# Patient Record
Sex: Female | Born: 1940
Health system: Southern US, Community
[De-identification: ages and names within clinical notes are randomized; demographics above are authoritative.]

## PROBLEM LIST (undated history)

## (undated) DIAGNOSIS — I440 Atrioventricular block, first degree: Secondary | ICD-10-CM

## (undated) DIAGNOSIS — R202 Paresthesia of skin: Secondary | ICD-10-CM

## (undated) DIAGNOSIS — N390 Urinary tract infection, site not specified: Secondary | ICD-10-CM

## (undated) DIAGNOSIS — M1990 Unspecified osteoarthritis, unspecified site: Secondary | ICD-10-CM

## (undated) DIAGNOSIS — Z8719 Personal history of other diseases of the digestive system: Secondary | ICD-10-CM

## (undated) DIAGNOSIS — N189 Chronic kidney disease, unspecified: Secondary | ICD-10-CM

## (undated) DIAGNOSIS — I447 Left bundle-branch block, unspecified: Secondary | ICD-10-CM

## (undated) DIAGNOSIS — Z87448 Personal history of other diseases of urinary system: Secondary | ICD-10-CM

## (undated) DIAGNOSIS — G709 Myoneural disorder, unspecified: Secondary | ICD-10-CM

## (undated) DIAGNOSIS — E039 Hypothyroidism, unspecified: Secondary | ICD-10-CM

## (undated) DIAGNOSIS — D179 Benign lipomatous neoplasm, unspecified: Secondary | ICD-10-CM

## (undated) DIAGNOSIS — I739 Peripheral vascular disease, unspecified: Secondary | ICD-10-CM

## (undated) DIAGNOSIS — D649 Anemia, unspecified: Secondary | ICD-10-CM

## (undated) DIAGNOSIS — F419 Anxiety disorder, unspecified: Secondary | ICD-10-CM

## (undated) DIAGNOSIS — J189 Pneumonia, unspecified organism: Secondary | ICD-10-CM

## (undated) DIAGNOSIS — G8929 Other chronic pain: Secondary | ICD-10-CM

## (undated) DIAGNOSIS — E7800 Pure hypercholesterolemia, unspecified: Secondary | ICD-10-CM

## (undated) DIAGNOSIS — R200 Anesthesia of skin: Secondary | ICD-10-CM

## (undated) DIAGNOSIS — M48061 Spinal stenosis, lumbar region without neurogenic claudication: Secondary | ICD-10-CM

## (undated) DIAGNOSIS — L853 Xerosis cutis: Secondary | ICD-10-CM

## (undated) DIAGNOSIS — M4802 Spinal stenosis, cervical region: Secondary | ICD-10-CM

## (undated) DIAGNOSIS — K219 Gastro-esophageal reflux disease without esophagitis: Secondary | ICD-10-CM

## (undated) DIAGNOSIS — I10 Essential (primary) hypertension: Secondary | ICD-10-CM

## (undated) DIAGNOSIS — E079 Disorder of thyroid, unspecified: Secondary | ICD-10-CM

## (undated) DIAGNOSIS — M549 Dorsalgia, unspecified: Secondary | ICD-10-CM

## (undated) DIAGNOSIS — L309 Dermatitis, unspecified: Secondary | ICD-10-CM

## (undated) HISTORY — DX: Spinal stenosis, cervical region: M48.02

## (undated) HISTORY — DX: Disorder of thyroid, unspecified: E07.9

## (undated) HISTORY — PX: APPENDECTOMY: SHX54

## (undated) HISTORY — DX: Other chronic pain: G89.29

## (undated) HISTORY — PX: COLONOSCOPY W/ POLYPECTOMY: SHX1380

## (undated) HISTORY — PX: JOINT REPLACEMENT: SHX530

## (undated) HISTORY — PX: TONSILLECTOMY: SUR1361

## (undated) HISTORY — DX: Anxiety disorder, unspecified: F41.9

## (undated) HISTORY — DX: Pure hypercholesterolemia, unspecified: E78.00

## (undated) HISTORY — DX: Dorsalgia, unspecified: M54.9

## (undated) HISTORY — DX: Essential (primary) hypertension: I10

## (undated) HISTORY — DX: Spinal stenosis, lumbar region without neurogenic claudication: M48.061

---

## 1969-01-15 HISTORY — PX: THYROIDECTOMY: SHX17

## 1982-01-15 HISTORY — PX: APPENDECTOMY: SHX54

## 1984-01-16 HISTORY — PX: TOTAL ABDOMINAL HYSTERECTOMY: SHX209

## 1997-04-29 ENCOUNTER — Other Ambulatory Visit: Admission: RE | Admit: 1997-04-29 | Discharge: 1997-04-29 | Payer: Self-pay | Admitting: Family Medicine

## 1998-07-05 ENCOUNTER — Ambulatory Visit (HOSPITAL_COMMUNITY): Admission: RE | Admit: 1998-07-05 | Discharge: 1998-07-05 | Payer: Self-pay | Admitting: Gastroenterology

## 2000-06-10 ENCOUNTER — Other Ambulatory Visit: Admission: RE | Admit: 2000-06-10 | Discharge: 2000-06-10 | Payer: Self-pay | Admitting: Radiology

## 2000-06-19 ENCOUNTER — Encounter: Payer: Self-pay | Admitting: General Surgery

## 2000-06-20 ENCOUNTER — Encounter (INDEPENDENT_AMBULATORY_CARE_PROVIDER_SITE_OTHER): Payer: Self-pay | Admitting: Specialist

## 2000-06-20 ENCOUNTER — Ambulatory Visit (HOSPITAL_COMMUNITY): Admission: RE | Admit: 2000-06-20 | Discharge: 2000-06-20 | Payer: Self-pay | Admitting: General Surgery

## 2002-08-07 ENCOUNTER — Ambulatory Visit (HOSPITAL_COMMUNITY): Admission: RE | Admit: 2002-08-07 | Discharge: 2002-08-07 | Payer: Self-pay | Admitting: Gastroenterology

## 2002-08-07 ENCOUNTER — Encounter (INDEPENDENT_AMBULATORY_CARE_PROVIDER_SITE_OTHER): Payer: Self-pay | Admitting: Specialist

## 2003-11-29 ENCOUNTER — Other Ambulatory Visit: Admission: RE | Admit: 2003-11-29 | Discharge: 2003-11-29 | Payer: Self-pay | Admitting: Family Medicine

## 2004-01-16 HISTORY — PX: BREAST BIOPSY: SHX20

## 2008-07-07 ENCOUNTER — Encounter: Admission: RE | Admit: 2008-07-07 | Discharge: 2008-08-04 | Payer: Self-pay | Admitting: Family Medicine

## 2008-10-20 ENCOUNTER — Encounter: Admission: RE | Admit: 2008-10-20 | Discharge: 2008-10-20 | Payer: Self-pay | Admitting: Family Medicine

## 2008-11-29 ENCOUNTER — Encounter: Admission: RE | Admit: 2008-11-29 | Discharge: 2008-11-29 | Payer: Self-pay | Admitting: Family Medicine

## 2009-01-06 ENCOUNTER — Encounter: Admission: RE | Admit: 2009-01-06 | Discharge: 2009-01-06 | Payer: Self-pay | Admitting: Family Medicine

## 2009-01-20 ENCOUNTER — Encounter: Admission: RE | Admit: 2009-01-20 | Discharge: 2009-01-20 | Payer: Self-pay | Admitting: Neurology

## 2009-01-28 ENCOUNTER — Inpatient Hospital Stay (HOSPITAL_COMMUNITY): Admission: RE | Admit: 2009-01-28 | Discharge: 2009-01-30 | Payer: Self-pay | Admitting: Neurosurgery

## 2009-01-28 HISTORY — PX: CERVICAL SPINE SURGERY: SHX589

## 2009-06-30 ENCOUNTER — Encounter: Admission: RE | Admit: 2009-06-30 | Discharge: 2009-06-30 | Payer: Self-pay | Admitting: Family Medicine

## 2010-04-02 LAB — BASIC METABOLIC PANEL
BUN: 10 mg/dL (ref 6–23)
CO2: 27 mEq/L (ref 19–32)
Calcium: 9.5 mg/dL (ref 8.4–10.5)
Chloride: 101 mEq/L (ref 96–112)
Creatinine, Ser: 0.7 mg/dL (ref 0.4–1.2)
GFR calc Af Amer: >60 mL/min (ref >60)
GFR calc non Af Amer: >60 mL/min (ref >60)
Glucose, Bld: 116 mg/dL — ABNORMAL HIGH (ref 70–99)
Potassium: 3.1 mEq/L — ABNORMAL LOW (ref 3.5–5.1)
Sodium: 138 mEq/L (ref 135–145)

## 2010-04-02 LAB — CBC
HCT: 42.6 % (ref 36.0–46.0)
Hemoglobin: 14.4 g/dL (ref 12.0–15.0)
MCHC: 33.9 g/dL (ref 30.0–36.0)
MCV: 84.5 fL (ref 78.0–100.0)
Platelets: 343 10*3/uL (ref 150–400)
RBC: 5.04 MIL/uL (ref 3.87–5.11)
RDW: 13.6 % (ref 11.5–15.5)
WBC: 11.3 10*3/uL — ABNORMAL HIGH (ref 4.0–10.5)

## 2010-06-02 NOTE — Op Note (Signed)
   Ashley Mercer, Ashley Mercer                           ACCOUNT NO.:  [id number]   MEDICAL RECORD NO.:  [id number]                   PATIENT TYPE:  AMB   LOCATION:  ENDO                                 FACILITY:  [facility]   PHYSICIAN:  Bernette Redbird, M.D.                DATE OF BIRTH:  Sep 26, 1940   DATE OF PROCEDURE:  08/07/2002  DATE OF DISCHARGE:                                 OPERATIVE REPORT   PROCEDURE:  Colonoscopy with biopsies.   INDICATIONS FOR PROCEDURE:  Follow up of colonic adenomas removed  colonoscopically about 4 years ago, at which time colonoscopy was being done  because of a polyp seen on the screening flexible sigmoidoscopy.   FINDINGS:  Several small sessile polyps, removed.   DESCRIPTION OF PROCEDURE:  The nature, purpose and risks of the procedure to  the patient from prior examinations. She provided written consent. Sedation  was Fentanyl 70 micrograms and Versed 7 mg IV without arrhythmias or  desaturation.   The Olympus adjustable tension pediatric video colonoscope was advanced to  the cecum with clear visualization of the appendiceal orifice. Pullback was  then performed. The quality of the prep was very good and it was felt that  all areas were well seen.   In the left colon, probably near the splenic flexure, there was a roughly 5-  mm sessile polyp  removed by several cold biopsies and adjacent to it was a  tiny hyperplastic-appearing polyp which was also biopsied. There was another  small sessile hyperplastic-appearing polyp at about  25 cm removed by single  cold biopsy.   No large polyps, cancer, colitis, vascular malformations or diverticular  disease were noted. Retroflexion in the rectum as well as reinspection of  the rectum was unremarkable.   The patient tolerated the procedure well. There were no apparent  complications.   IMPRESSION:  Small colon polyps removed as described above.    PLAN:  Await pathology with anticipated colonoscopic  follow up in 3 to 5  years depending on the histologic findings.                                                 Bernette Redbird, M.D.    RB/MEDQ  D:  08/07/2002  T:  08/07/2002  Job:  [id number]   cc:   Molly Maduro A. Nicholos Johns, M.D.  [address]  [place]  [place] [address]  Fax: [phone]

## 2010-06-02 NOTE — Op Note (Signed)
[facility]  Patient:    Ashley Mercer, Ashley Mercer                        MRN: [id number] Proc. Date: 06/20/00 Adm. Date:  [id number] Attending:  Carson Myrtle CC:         Elana Alm Eliezer Lofts., M.D.  Jeralyn Ruths, M.D.   Operative Report  PREOPERATIVE DIAGNOSIS:  Mass left breast.  POSTOPERATIVE DIAGNOSIS:  Mass left breast.  PROCEDURE:  Partial mastectomy left.  SURGEON:  Timothy E. Earlene Plater, M.D.  ANESTHESIA:  Local standby.  INDICATIONS:  Ms. Stare is otherwise healthy, history of hypertension, thyroid replacement, no prior breast history.  Developed a bloody discharge left nipple, was evaluated and worked up by Dr. Yolanda Bonine, showing evidence for a papilloma.  The patient presented for consultation.  Surgery was scheduled at her wish.  Laboratory data were normal.  EKG shows new changes of left atrial enlargement and left bundle branch block which will be evaluated.  DESCRIPTION OF PROCEDURE:  The patient was brought to the operating room and placed supine.  IV started sedation given.  Left breast prepped and draped in the usual fashion.  Gentle compression in the 1 oclock position left breast indeed produced a small amount of bloody discharge.  A lacrimal duct probe was placed through the orifice of the left nipple and indeed proceeded to the left upper out quadrant.  Marcaine 0.25% with epinephrine was used for local anesthesia around the areolar edge.  A generous incision was made, the subcutaneous tissue dissected, and gentle motion of the duct probe indicated its position in the 2 oclock position.  I gently by blunt and sharp dissection, was able to identify a mass in this area, dissected down upon it, and was dissecting round and about this mass and the tear duct probe; however, the fibrocystic changes and evidence for ductal discharge progressed from that 2 oclock position and curved up into the 12 oclock position within the fibrous  portion of the breast tissue.  All of this tissue was completely removed.  Once out, this was marked, a single strand of silk indicating the 12 oclock position, a double strand of silk indicating the 2 oclock position. Bleeding was controlled with cautery.  The wound was dry, and the tissue was approximated with 2-0 Vicryl.  The skin was closed with 4-0 Monocryl. Steri-Strips applied.  Sponge, sharp, and instrument counts correct.  She tolerated it well and was removed to the recovery room.  Family conference was held and instruction given including Vicodin for pain. DD:  06/20/00 TD:  06/20/00 Job: [id number]

## 2011-04-02 DIAGNOSIS — Z23 Encounter for immunization: Secondary | ICD-10-CM | POA: Diagnosis not present

## 2011-04-02 DIAGNOSIS — K219 Gastro-esophageal reflux disease without esophagitis: Secondary | ICD-10-CM | POA: Diagnosis not present

## 2011-04-02 DIAGNOSIS — M545 Low back pain, unspecified: Secondary | ICD-10-CM | POA: Diagnosis not present

## 2011-04-02 DIAGNOSIS — Z0000 Encounter for general adult medical examination without abnormal findings: Secondary | ICD-10-CM | POA: Diagnosis not present

## 2011-04-02 DIAGNOSIS — E039 Hypothyroidism, unspecified: Secondary | ICD-10-CM | POA: Diagnosis not present

## 2011-04-02 DIAGNOSIS — E785 Hyperlipidemia, unspecified: Secondary | ICD-10-CM | POA: Diagnosis not present

## 2011-04-02 DIAGNOSIS — I10 Essential (primary) hypertension: Secondary | ICD-10-CM | POA: Diagnosis not present

## 2011-04-02 DIAGNOSIS — N951 Menopausal and female climacteric states: Secondary | ICD-10-CM | POA: Diagnosis not present

## 2011-06-26 DIAGNOSIS — R5381 Other malaise: Secondary | ICD-10-CM | POA: Diagnosis not present

## 2011-06-26 DIAGNOSIS — R5383 Other fatigue: Secondary | ICD-10-CM | POA: Diagnosis not present

## 2011-06-26 DIAGNOSIS — M549 Dorsalgia, unspecified: Secondary | ICD-10-CM | POA: Diagnosis not present

## 2011-07-02 ENCOUNTER — Other Ambulatory Visit: Payer: Self-pay | Admitting: Family Medicine

## 2011-07-02 DIAGNOSIS — Z1231 Encounter for screening mammogram for malignant neoplasm of breast: Secondary | ICD-10-CM

## 2011-07-09 ENCOUNTER — Ambulatory Visit
Admission: RE | Admit: 2011-07-09 | Discharge: 2011-07-09 | Disposition: A | Discharge status: Home / Self Care | Payer: Medicare Other | Source: Ambulatory Visit | Attending: Family Medicine | Admitting: Family Medicine

## 2011-07-09 ENCOUNTER — Other Ambulatory Visit: Payer: Self-pay | Admitting: Neurology

## 2011-07-09 ENCOUNTER — Other Ambulatory Visit: Payer: Self-pay | Admitting: Family Medicine

## 2011-07-09 DIAGNOSIS — G8929 Other chronic pain: Secondary | ICD-10-CM

## 2011-07-09 DIAGNOSIS — R531 Weakness: Secondary | ICD-10-CM

## 2011-07-09 DIAGNOSIS — Z1231 Encounter for screening mammogram for malignant neoplasm of breast: Secondary | ICD-10-CM | POA: Diagnosis not present

## 2011-07-11 ENCOUNTER — Other Ambulatory Visit: Payer: Self-pay | Admitting: Neurology

## 2011-07-11 ENCOUNTER — Ambulatory Visit
Admission: RE | Admit: 2011-07-11 | Discharge: 2011-07-11 | Disposition: A | Discharge status: Home / Self Care | Payer: Medicare Other | Source: Ambulatory Visit | Attending: Neurology | Admitting: Neurology

## 2011-07-11 DIAGNOSIS — R531 Weakness: Secondary | ICD-10-CM

## 2011-07-11 DIAGNOSIS — G8929 Other chronic pain: Secondary | ICD-10-CM

## 2011-07-11 DIAGNOSIS — M549 Dorsalgia, unspecified: Secondary | ICD-10-CM

## 2011-07-11 DIAGNOSIS — M47817 Spondylosis without myelopathy or radiculopathy, lumbosacral region: Secondary | ICD-10-CM | POA: Diagnosis not present

## 2011-07-11 DIAGNOSIS — M5137 Other intervertebral disc degeneration, lumbosacral region: Secondary | ICD-10-CM | POA: Diagnosis not present

## 2011-07-11 DIAGNOSIS — M5126 Other intervertebral disc displacement, lumbar region: Secondary | ICD-10-CM | POA: Diagnosis not present

## 2011-07-12 DIAGNOSIS — M5030 Other cervical disc degeneration, unspecified cervical region: Secondary | ICD-10-CM | POA: Diagnosis not present

## 2011-07-12 DIAGNOSIS — M542 Cervicalgia: Secondary | ICD-10-CM | POA: Diagnosis not present

## 2011-07-18 ENCOUNTER — Other Ambulatory Visit: Payer: Self-pay | Admitting: Gastroenterology

## 2011-07-18 DIAGNOSIS — K5730 Diverticulosis of large intestine without perforation or abscess without bleeding: Secondary | ICD-10-CM | POA: Diagnosis not present

## 2011-07-18 DIAGNOSIS — Z09 Encounter for follow-up examination after completed treatment for conditions other than malignant neoplasm: Secondary | ICD-10-CM | POA: Diagnosis not present

## 2011-07-18 DIAGNOSIS — Z86010 Personal history of colonic polyps: Secondary | ICD-10-CM | POA: Diagnosis not present

## 2011-07-18 DIAGNOSIS — D126 Benign neoplasm of colon, unspecified: Secondary | ICD-10-CM | POA: Diagnosis not present

## 2011-08-21 ENCOUNTER — Ambulatory Visit: Payer: Medicare Other | Attending: Neurology | Admitting: Physical Therapy

## 2011-08-21 DIAGNOSIS — M545 Low back pain, unspecified: Secondary | ICD-10-CM | POA: Insufficient documentation

## 2011-08-21 DIAGNOSIS — IMO0001 Reserved for inherently not codable concepts without codable children: Secondary | ICD-10-CM | POA: Insufficient documentation

## 2011-08-21 DIAGNOSIS — M2569 Stiffness of other specified joint, not elsewhere classified: Secondary | ICD-10-CM | POA: Insufficient documentation

## 2011-08-30 ENCOUNTER — Encounter: Payer: Medicare Other | Admitting: Physical Therapy

## 2011-09-05 ENCOUNTER — Ambulatory Visit: Payer: Medicare Other | Admitting: Physical Therapy

## 2011-09-05 DIAGNOSIS — M545 Low back pain, unspecified: Secondary | ICD-10-CM | POA: Diagnosis not present

## 2011-09-05 DIAGNOSIS — IMO0001 Reserved for inherently not codable concepts without codable children: Secondary | ICD-10-CM | POA: Diagnosis not present

## 2011-09-05 DIAGNOSIS — M2569 Stiffness of other specified joint, not elsewhere classified: Secondary | ICD-10-CM | POA: Diagnosis not present

## 2011-09-11 ENCOUNTER — Ambulatory Visit: Payer: Medicare Other | Admitting: Physical Therapy

## 2011-09-11 DIAGNOSIS — IMO0001 Reserved for inherently not codable concepts without codable children: Secondary | ICD-10-CM | POA: Diagnosis not present

## 2011-09-11 DIAGNOSIS — M545 Low back pain, unspecified: Secondary | ICD-10-CM | POA: Diagnosis not present

## 2011-09-11 DIAGNOSIS — M2569 Stiffness of other specified joint, not elsewhere classified: Secondary | ICD-10-CM | POA: Diagnosis not present

## 2011-09-13 ENCOUNTER — Ambulatory Visit: Payer: Medicare Other | Admitting: Physical Therapy

## 2011-09-18 ENCOUNTER — Ambulatory Visit: Payer: Medicare Other | Attending: Neurology | Admitting: Physical Therapy

## 2011-09-18 DIAGNOSIS — M545 Low back pain, unspecified: Secondary | ICD-10-CM | POA: Insufficient documentation

## 2011-09-18 DIAGNOSIS — M2569 Stiffness of other specified joint, not elsewhere classified: Secondary | ICD-10-CM | POA: Insufficient documentation

## 2011-09-18 DIAGNOSIS — IMO0001 Reserved for inherently not codable concepts without codable children: Secondary | ICD-10-CM | POA: Insufficient documentation

## 2011-09-20 ENCOUNTER — Ambulatory Visit: Payer: Medicare Other | Admitting: Physical Therapy

## 2011-09-25 ENCOUNTER — Ambulatory Visit: Payer: Medicare Other | Admitting: Physical Therapy

## 2011-09-26 DIAGNOSIS — M48061 Spinal stenosis, lumbar region without neurogenic claudication: Secondary | ICD-10-CM | POA: Diagnosis not present

## 2011-09-27 ENCOUNTER — Ambulatory Visit: Payer: Medicare Other | Admitting: Physical Therapy

## 2011-10-02 ENCOUNTER — Ambulatory Visit: Payer: Medicare Other | Admitting: Physical Therapy

## 2011-10-04 ENCOUNTER — Encounter: Payer: Medicare Other | Admitting: Physical Therapy

## 2011-10-22 DIAGNOSIS — K219 Gastro-esophageal reflux disease without esophagitis: Secondary | ICD-10-CM | POA: Diagnosis not present

## 2011-10-22 DIAGNOSIS — E039 Hypothyroidism, unspecified: Secondary | ICD-10-CM | POA: Diagnosis not present

## 2011-10-22 DIAGNOSIS — N951 Menopausal and female climacteric states: Secondary | ICD-10-CM | POA: Diagnosis not present

## 2011-10-22 DIAGNOSIS — E785 Hyperlipidemia, unspecified: Secondary | ICD-10-CM | POA: Diagnosis not present

## 2011-10-22 DIAGNOSIS — M545 Low back pain, unspecified: Secondary | ICD-10-CM | POA: Diagnosis not present

## 2011-10-22 DIAGNOSIS — I10 Essential (primary) hypertension: Secondary | ICD-10-CM | POA: Diagnosis not present

## 2011-10-22 DIAGNOSIS — Z23 Encounter for immunization: Secondary | ICD-10-CM | POA: Diagnosis not present

## 2012-03-05 DIAGNOSIS — R269 Unspecified abnormalities of gait and mobility: Secondary | ICD-10-CM | POA: Diagnosis not present

## 2012-03-05 DIAGNOSIS — R209 Unspecified disturbances of skin sensation: Secondary | ICD-10-CM | POA: Diagnosis not present

## 2012-03-05 DIAGNOSIS — M48061 Spinal stenosis, lumbar region without neurogenic claudication: Secondary | ICD-10-CM | POA: Diagnosis not present

## 2012-04-24 DIAGNOSIS — M25569 Pain in unspecified knee: Secondary | ICD-10-CM | POA: Diagnosis not present

## 2012-04-24 DIAGNOSIS — E039 Hypothyroidism, unspecified: Secondary | ICD-10-CM | POA: Diagnosis not present

## 2012-04-24 DIAGNOSIS — N951 Menopausal and female climacteric states: Secondary | ICD-10-CM | POA: Diagnosis not present

## 2012-04-24 DIAGNOSIS — K219 Gastro-esophageal reflux disease without esophagitis: Secondary | ICD-10-CM | POA: Diagnosis not present

## 2012-04-24 DIAGNOSIS — Z1331 Encounter for screening for depression: Secondary | ICD-10-CM | POA: Diagnosis not present

## 2012-04-24 DIAGNOSIS — E785 Hyperlipidemia, unspecified: Secondary | ICD-10-CM | POA: Diagnosis not present

## 2012-04-24 DIAGNOSIS — I10 Essential (primary) hypertension: Secondary | ICD-10-CM | POA: Diagnosis not present

## 2012-04-24 DIAGNOSIS — M545 Low back pain, unspecified: Secondary | ICD-10-CM | POA: Diagnosis not present

## 2012-05-21 ENCOUNTER — Other Ambulatory Visit: Payer: Self-pay | Admitting: Family Medicine

## 2012-05-21 DIAGNOSIS — Z1231 Encounter for screening mammogram for malignant neoplasm of breast: Secondary | ICD-10-CM

## 2012-06-10 DIAGNOSIS — M1710 Unilateral primary osteoarthritis, unspecified knee: Secondary | ICD-10-CM | POA: Diagnosis not present

## 2012-07-11 ENCOUNTER — Ambulatory Visit: Payer: Medicare Other

## 2012-07-11 ENCOUNTER — Ambulatory Visit
Admission: RE | Admit: 2012-07-11 | Discharge: 2012-07-11 | Disposition: A | Discharge status: Home / Self Care | Payer: Medicare Other | Source: Ambulatory Visit | Attending: Family Medicine | Admitting: Family Medicine

## 2012-07-11 DIAGNOSIS — Z1231 Encounter for screening mammogram for malignant neoplasm of breast: Secondary | ICD-10-CM

## 2012-08-18 ENCOUNTER — Telehealth: Payer: Self-pay | Admitting: Neurology

## 2012-08-18 NOTE — Telephone Encounter (Signed)
Pt has not been seen by Dr. Frances Furbish prior Dr. Sandria Manly pt needs to be reassigned per Dr. Frances Furbish.

## 2012-08-20 NOTE — Telephone Encounter (Signed)
Assigned to Dr. Penumalli. 

## 2012-09-02 ENCOUNTER — Ambulatory Visit: Payer: Self-pay | Admitting: Neurology

## 2012-10-08 DIAGNOSIS — I10 Essential (primary) hypertension: Secondary | ICD-10-CM | POA: Diagnosis not present

## 2012-10-08 DIAGNOSIS — E039 Hypothyroidism, unspecified: Secondary | ICD-10-CM | POA: Diagnosis not present

## 2012-10-08 DIAGNOSIS — M545 Low back pain, unspecified: Secondary | ICD-10-CM | POA: Diagnosis not present

## 2012-10-08 DIAGNOSIS — Z23 Encounter for immunization: Secondary | ICD-10-CM | POA: Diagnosis not present

## 2012-10-08 DIAGNOSIS — K219 Gastro-esophageal reflux disease without esophagitis: Secondary | ICD-10-CM | POA: Diagnosis not present

## 2012-10-08 DIAGNOSIS — E785 Hyperlipidemia, unspecified: Secondary | ICD-10-CM | POA: Diagnosis not present

## 2012-10-08 DIAGNOSIS — Z0000 Encounter for general adult medical examination without abnormal findings: Secondary | ICD-10-CM | POA: Diagnosis not present

## 2012-10-08 DIAGNOSIS — Z1331 Encounter for screening for depression: Secondary | ICD-10-CM | POA: Diagnosis not present

## 2012-10-08 DIAGNOSIS — N951 Menopausal and female climacteric states: Secondary | ICD-10-CM | POA: Diagnosis not present

## 2012-10-14 ENCOUNTER — Ambulatory Visit (INDEPENDENT_AMBULATORY_CARE_PROVIDER_SITE_OTHER): Payer: Medicare Other | Admitting: Diagnostic Neuroimaging

## 2012-10-14 ENCOUNTER — Encounter: Payer: Self-pay | Admitting: Diagnostic Neuroimaging

## 2012-10-14 VITALS — BP 122/64 | HR 68 | Temp 97.9°F | Ht 62.0 in | Wt 198.5 lb

## 2012-10-14 DIAGNOSIS — M4712 Other spondylosis with myelopathy, cervical region: Secondary | ICD-10-CM | POA: Diagnosis not present

## 2012-10-14 DIAGNOSIS — R269 Unspecified abnormalities of gait and mobility: Secondary | ICD-10-CM | POA: Diagnosis not present

## 2012-10-14 DIAGNOSIS — M48061 Spinal stenosis, lumbar region without neurogenic claudication: Secondary | ICD-10-CM | POA: Diagnosis not present

## 2012-10-14 NOTE — Patient Instructions (Signed)
Follow up with PCP and Dr. Jeral Fruit.

## 2012-10-14 NOTE — Progress Notes (Signed)
[facility]  PATIENT: Ashley Mercer DOB: September 22, 1940  REFERRING CLINICIAN:  HISTORY FROM: patient REASON FOR VISIT: follow up   HISTORICAL  CHIEF COMPLAINT:  Chief Complaint  Patient presents with  . Follow-up    Prior Dr. Sandria Manly patient, Myelopathy, cervical spine, gait disturbance, weakness    HISTORY OF PRESENT ILLNESS:   UPDATE 10/14/12: Since last visit, pain and symptoms are stable. Tried reducing cymbalta off, but realized that it was helping her pain control. Tried water aerobics last summer which helped.  UPDATE 03/05/12 (CM): Returns for followup. She is actually feeling better in regards to back pain, she remains on Cymbalta 60mg  daily. There is no radiation down the legs. Husband recently in hospital for MG. She continues to walk for exercise. Has not tried water therapy.   PRIOR HPI (09/28/11, Dr. Sandria Manly): 72 year old right-handed, African American married female who developed a "crick in her neck" on the right side in June 2010. She also developed eczema. She noticed difficulty getting up to go to the bathroom  and loss of balance. In October 2010 her stumbling was worse. She discontinued Zocor and was placed on WelChol but had no benefit. She noticed weakness in her arms. She had difficulty with numbness and a cold sensation in the left hand involving all 5 fingers and in her right hand involving all 5 fingers beginning November 2010.She noted a sensation of heat on the lateral  aspect of both legs. She denied a history of Lhermitte's sign, bowel or bladder dysfunction, or focal weakness. She had no cranial nerve symptoms. She had pain in the right scapular region. She had no falls or injuries. MRI study of the brain from 11/29/2008 showed an HNP at C4-5 that in the sagittal view as well as severe spinal stenosis but no brain abnormalities. Cervical MRI  confirmed severe cervical spinal stenosis with cord compression. She was referred to Dr Jeral Fruit who performed  anterior cervical diskectomy, decompression and fusion on January 14th, 2011.Her symptoms continue to improve. She has some numbness and tingling in the distal portions of her fingers, but all other paresthesias have resolved. She notes that the right hand has improved more than the left. She says that her 4th and 5th fingers feel almost normal now. She has more finger dexterity now and can button and do other fine motor activities.  She has occasional leg cramps that are not severe. She notes fatigue with walking long distances. 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 She has nonradiating back pain since  summer 2012 and started Cymbalta, which she says dramatically decreased her pain for 6 months, but now seems to have no effect. She also takes Tramadol at intervals. She is dieting. Her pain has gotten worse. Her pain is not radiating into her legs. She has an increase with bending over coughing and sneezing. There is no bowel or bladder incontinence.The pain is always present.She thinks it may have started as far back as 2011. It effects herwalking. She denies numbness in her feet and legs. She is undergoing physical therapy with definite benefit. Her pain has gone from a 7-8/10 to 4-5/10. rather this discomfort down her left leg. Her symptoms are worse standing or walking. She denies other symptoms.  REVIEW OF SYSTEMS: Full 14 system review of systems performed and notable only for an joint pain and cramps aching muscles and sensitivity numbness in the left hand and right fingertips.  ALLERGIES: No Known Allergies  HOME MEDICATIONS:  Prior to Admission medications   Medication Sig Start Date End Date Taking? Authorizing Provider  amLODipine (NORVASC) 10 MG tablet Take 10 mg by mouth daily.   Yes Historical Provider, MD  aspirin EC 81 MG tablet Take 81 mg by mouth daily.   Yes Historical Provider, MD  colesevelam (WELCHOL) 625 MG tablet Take 1,250 mg by mouth 2 (two) times daily with a  meal.   Yes Historical Provider, MD  DULoxetine (CYMBALTA) 60 MG capsule Take 60 mg by mouth daily.   Yes Historical Provider, MD  gemfibrozil (LOPID) 600 MG tablet Take 600 mg by mouth daily.   Yes Historical Provider, MD  ibuprofen (ADVIL,MOTRIN) 800 MG tablet Take 800 mg by mouth 2 (two) times daily.   Yes Historical Provider, MD  levothyroxine (SYNTHROID, LEVOTHROID) 100 MCG tablet Take 100 mcg by mouth daily before breakfast.   Yes Historical Provider, MD  Multiple Vitamins-Minerals (MULTIVITAMIN PO) Take by mouth daily.   Yes Historical Provider, MD  quinapril-hydrochlorothiazide (ACCURETIC) 20-25 MG per tablet Take 1 tablet by mouth daily.   Yes Historical Provider, MD  raloxifene (EVISTA) 60 MG tablet Take 60 mg by mouth daily.   Yes Historical Provider, MD  traMADol (ULTRAM) 50 MG tablet Take 50 mg by mouth every 6 (six) hours as needed for pain.   Yes Historical Provider, MD   No outpatient prescriptions prior to visit.   No facility-administered medications prior to visit.    PAST MEDICAL HISTORY: Past Medical History  Diagnosis Date  . Hypertension   . Thyroid condition   . Chronic back pain   . Cervical spinal stenosis     PAST SURGICAL HISTORY: Past Surgical History  Procedure Laterality Date  . Thyroidectomy  1971  . Total abdominal hysterectomy  1986  . Breast biopsy  2006    benign  . Cervical spine surgery  01/28/2009    FAMILY HISTORY: Family History  Problem Relation Age of Onset  . Heart disease Mother   . Kidney failure Father     SOCIAL HISTORY:  History   Social History  . Marital Status: Married    Spouse Name: Fayrene Fearing    Number of Children: 5  . Years of Education: College   Occupational History  . Retired     Charity fundraiser   Social History Main Topics  . Smoking status: Never Smoker   . Smokeless tobacco: Never Used  . Alcohol Use: Yes     Comment: quit in 1997; wine occasionally  . Drug Use: No  . Sexual Activity: Not on file   Other  Topics Concern  . Not on file   Social History Narrative   Patient lives at home with her spouse.   Caffeine Use: 2 cups daily     PHYSICAL EXAM  Filed Vitals:   10/14/12 0836  BP: 122/64  Pulse: 68  Temp: 97.9 F (36.6 C)  TempSrc: Oral  Height: 5\' 2"  (1.575 m)  Weight: 198 lb 8 oz (90.039 kg)    Not recorded    Body mass index is 36.3 kg/(m^2).  GENERAL EXAM: Patient is in no distress  CARDIOVASCULAR: Regular rate and rhythm, no murmurs, no carotid bruits  NEUROLOGIC: MENTAL STATUS: awake, alert, language fluent, comprehension intact, naming intact CRANIAL NERVE: no papilledema on fundoscopic exam, pupils equal and reactive to light, visual fields full to confrontation, extraocular muscles intact, no nystagmus, facial sensation and strength symmetric, uvula midline, shoulder shrug symmetric, tongue midline. MOTOR: normal bulk and tone, full  strength in the BUE, BLE SENSORY: normal and symmetric to light touch COORDINATION: finger-nose-finger, fine finger movements normal REFLEXES: deep tendon reflexes present and symmetric GAIT/STATION: SLOW, STIFF GAIT, SLIGHTLY UNSTEADY. CANNOT TANDEM.    DIAGNOSTIC DATA (LABS, IMAGING, TESTING) - I reviewed patient records, labs, notes, testing and imaging myself where available.  Lab Results  Component Value Date   WBC 11.3* 01/28/2009   HGB 14.4 01/28/2009   HCT 42.6 01/28/2009   MCV 84.5 01/28/2009   PLT 343 01/28/2009      Component Value Date/Time   NA 138 01/28/2009 0755   K 3.1* 01/28/2009 0755   CL 101 01/28/2009 0755   CO2 27 01/28/2009 0755   GLUCOSE 116* 01/28/2009 0755   BUN 10 01/28/2009 0755   CREATININE 0.70 01/28/2009 0755   CALCIUM 9.5 01/28/2009 0755   GFRNONAA >60 01/28/2009 0755   GFRAA  Value: >60        The eGFR has been calculated using the MDRD equation. This calculation has not been validated in all clinical situations. eGFR's persistently <60 mL/min signify possible Chronic Kidney Disease. 01/28/2009  0755   No results found for this basename: CHOL, HDL, LDLCALC, LDLDIRECT, TRIG, CHOLHDL   No results found for this basename: HGBA1C   No results found for this basename: VITAMINB12   No results found for this basename: TSH     ASSESSMENT AND PLAN  72 y.o. year old female here with lumbar spinal stenosis, cervical myelopathy s/p cervical discectomy at C4-C5. Symptoms stable. No active neurologic issues. Recommend she follow up with PCP and neurosurgery or pain mgmt going forward to manage chronic pain.  Return for return to PCP.    Suanne Marker, MD 10/14/2012, 9:04 AM Certified in Neurology, Neurophysiology and Neuroimaging  [facility] [address], [address] [place], [place] [address] [phone]

## 2012-11-04 DIAGNOSIS — E8941 Symptomatic postprocedural ovarian failure: Secondary | ICD-10-CM | POA: Diagnosis not present

## 2013-01-16 DIAGNOSIS — L259 Unspecified contact dermatitis, unspecified cause: Secondary | ICD-10-CM | POA: Diagnosis not present

## 2013-01-16 DIAGNOSIS — N3000 Acute cystitis without hematuria: Secondary | ICD-10-CM | POA: Diagnosis not present

## 2013-01-16 DIAGNOSIS — I998 Other disorder of circulatory system: Secondary | ICD-10-CM | POA: Diagnosis not present

## 2013-04-07 DIAGNOSIS — M545 Low back pain, unspecified: Secondary | ICD-10-CM | POA: Diagnosis not present

## 2013-04-07 DIAGNOSIS — K219 Gastro-esophageal reflux disease without esophagitis: Secondary | ICD-10-CM | POA: Diagnosis not present

## 2013-04-07 DIAGNOSIS — E785 Hyperlipidemia, unspecified: Secondary | ICD-10-CM | POA: Diagnosis not present

## 2013-04-07 DIAGNOSIS — E039 Hypothyroidism, unspecified: Secondary | ICD-10-CM | POA: Diagnosis not present

## 2013-04-07 DIAGNOSIS — I10 Essential (primary) hypertension: Secondary | ICD-10-CM | POA: Diagnosis not present

## 2013-10-08 DIAGNOSIS — Z1331 Encounter for screening for depression: Secondary | ICD-10-CM | POA: Diagnosis not present

## 2013-10-08 DIAGNOSIS — I10 Essential (primary) hypertension: Secondary | ICD-10-CM | POA: Diagnosis not present

## 2013-10-08 DIAGNOSIS — M545 Low back pain, unspecified: Secondary | ICD-10-CM | POA: Diagnosis not present

## 2013-10-08 DIAGNOSIS — Z23 Encounter for immunization: Secondary | ICD-10-CM | POA: Diagnosis not present

## 2013-10-08 DIAGNOSIS — N951 Menopausal and female climacteric states: Secondary | ICD-10-CM | POA: Diagnosis not present

## 2013-10-08 DIAGNOSIS — Z0000 Encounter for general adult medical examination without abnormal findings: Secondary | ICD-10-CM | POA: Diagnosis not present

## 2013-10-08 DIAGNOSIS — L259 Unspecified contact dermatitis, unspecified cause: Secondary | ICD-10-CM | POA: Diagnosis not present

## 2013-10-08 DIAGNOSIS — E039 Hypothyroidism, unspecified: Secondary | ICD-10-CM | POA: Diagnosis not present

## 2013-10-08 DIAGNOSIS — E785 Hyperlipidemia, unspecified: Secondary | ICD-10-CM | POA: Diagnosis not present

## 2013-10-08 DIAGNOSIS — K219 Gastro-esophageal reflux disease without esophagitis: Secondary | ICD-10-CM | POA: Diagnosis not present

## 2014-03-23 ENCOUNTER — Other Ambulatory Visit: Payer: Self-pay

## 2014-03-23 DIAGNOSIS — Z1231 Encounter for screening mammogram for malignant neoplasm of breast: Secondary | ICD-10-CM

## 2014-04-05 ENCOUNTER — Ambulatory Visit: Payer: Medicare Other

## 2014-04-08 DIAGNOSIS — I10 Essential (primary) hypertension: Secondary | ICD-10-CM | POA: Diagnosis not present

## 2014-04-08 DIAGNOSIS — E782 Mixed hyperlipidemia: Secondary | ICD-10-CM | POA: Diagnosis not present

## 2014-04-08 DIAGNOSIS — B354 Tinea corporis: Secondary | ICD-10-CM | POA: Diagnosis not present

## 2014-04-08 DIAGNOSIS — M545 Low back pain: Secondary | ICD-10-CM | POA: Diagnosis not present

## 2014-04-08 DIAGNOSIS — M4806 Spinal stenosis, lumbar region: Secondary | ICD-10-CM | POA: Diagnosis not present

## 2014-04-16 DIAGNOSIS — M4316 Spondylolisthesis, lumbar region: Secondary | ICD-10-CM | POA: Diagnosis not present

## 2014-04-16 DIAGNOSIS — M47816 Spondylosis without myelopathy or radiculopathy, lumbar region: Secondary | ICD-10-CM | POA: Diagnosis not present

## 2014-04-16 DIAGNOSIS — M47817 Spondylosis without myelopathy or radiculopathy, lumbosacral region: Secondary | ICD-10-CM | POA: Diagnosis not present

## 2014-04-16 DIAGNOSIS — M5136 Other intervertebral disc degeneration, lumbar region: Secondary | ICD-10-CM | POA: Diagnosis not present

## 2014-04-16 DIAGNOSIS — M419 Scoliosis, unspecified: Secondary | ICD-10-CM | POA: Diagnosis not present

## 2014-04-21 DIAGNOSIS — M549 Dorsalgia, unspecified: Secondary | ICD-10-CM | POA: Diagnosis not present

## 2014-04-21 DIAGNOSIS — M5137 Other intervertebral disc degeneration, lumbosacral region: Secondary | ICD-10-CM | POA: Diagnosis not present

## 2014-04-21 DIAGNOSIS — Z6839 Body mass index (BMI) 39.0-39.9, adult: Secondary | ICD-10-CM | POA: Diagnosis not present

## 2014-04-21 DIAGNOSIS — I10 Essential (primary) hypertension: Secondary | ICD-10-CM | POA: Diagnosis not present

## 2014-04-28 ENCOUNTER — Other Ambulatory Visit: Payer: Self-pay | Admitting: Neurosurgery

## 2014-04-28 DIAGNOSIS — M5137 Other intervertebral disc degeneration, lumbosacral region: Secondary | ICD-10-CM

## 2014-04-28 DIAGNOSIS — M545 Low back pain: Secondary | ICD-10-CM

## 2014-05-07 ENCOUNTER — Ambulatory Visit
Admission: RE | Admit: 2014-05-07 | Discharge: 2014-05-07 | Disposition: A | Discharge status: Home / Self Care | Payer: Medicare Other | Source: Ambulatory Visit | Attending: Neurosurgery | Admitting: Neurosurgery

## 2014-05-07 VITALS — BP 150/82 | HR 55

## 2014-05-07 DIAGNOSIS — M5137 Other intervertebral disc degeneration, lumbosacral region: Secondary | ICD-10-CM

## 2014-05-07 DIAGNOSIS — M47814 Spondylosis without myelopathy or radiculopathy, thoracic region: Secondary | ICD-10-CM | POA: Diagnosis not present

## 2014-05-07 DIAGNOSIS — M5136 Other intervertebral disc degeneration, lumbar region: Secondary | ICD-10-CM | POA: Diagnosis not present

## 2014-05-07 DIAGNOSIS — R269 Unspecified abnormalities of gait and mobility: Secondary | ICD-10-CM

## 2014-05-07 DIAGNOSIS — M545 Low back pain: Secondary | ICD-10-CM

## 2014-05-07 DIAGNOSIS — M4185 Other forms of scoliosis, thoracolumbar region: Secondary | ICD-10-CM | POA: Diagnosis not present

## 2014-05-07 DIAGNOSIS — M48061 Spinal stenosis, lumbar region without neurogenic claudication: Secondary | ICD-10-CM

## 2014-05-07 DIAGNOSIS — M47816 Spondylosis without myelopathy or radiculopathy, lumbar region: Secondary | ICD-10-CM | POA: Diagnosis not present

## 2014-05-07 MED ORDER — DIAZEPAM 5 MG PO TABS
5.0000 mg | ORAL_TABLET | Freq: Once | ORAL | Status: AC
  Administered 2014-05-07: 5 mg via ORAL

## 2014-05-07 MED ORDER — MEPERIDINE HCL 100 MG/ML IJ SOLN
50.0000 mg | Freq: Once | INTRAMUSCULAR | Status: AC
  Administered 2014-05-07: 50 mg via INTRAMUSCULAR

## 2014-05-07 MED ORDER — ONDANSETRON HCL 4 MG/2ML IJ SOLN: 4.0000 mg | Freq: Four times a day (QID) | INTRAMUSCULAR | Status: DC | PRN

## 2014-05-07 MED ORDER — MEPERIDINE HCL 100 MG/ML IJ SOLN
25.0000 mg | Freq: Once | INTRAMUSCULAR | Status: AC
  Administered 2014-05-07: 25 mg via INTRAMUSCULAR

## 2014-05-07 MED ORDER — ONDANSETRON HCL 4 MG/2ML IJ SOLN
4.0000 mg | Freq: Once | INTRAMUSCULAR | Status: AC
  Administered 2014-05-07: 4 mg via INTRAMUSCULAR

## 2014-05-07 MED ORDER — IOHEXOL 300 MG/ML  SOLN
10.0000 mL | Freq: Once | INTRAMUSCULAR | Status: AC | PRN
  Administered 2014-05-07: 10 mL via INTRATHECAL

## 2014-05-07 NOTE — Discharge Instructions (Signed)
Myelogram Discharge Instructions  1. Go home and rest quietly for the next 24 hours.  It is important to lie flat for the next 24 hours.  Get up only to go to the restroom.  You may lie in the bed or on a couch on your back, your stomach, your left side or your right side.  You may have one pillow under your head.  You may have pillows between your knees while you are on your side or under your knees while you are on your back.  2. DO NOT drive today.  Recline the seat as far back as it will go, while still wearing your seat belt, on the way home.  3. You may get up to go to the bathroom as needed.  You may sit up for 10 minutes to eat.  You may resume your normal diet and medications unless otherwise indicated.  Drink lots of extra fluids today and tomorrow.  4. The incidence of headache, nausea, or vomiting is about 5% (one in 20 patients).  If you develop a headache, lie flat and drink plenty of fluids until the headache goes away.  Caffeinated beverages may be helpful.  If you develop severe nausea and vomiting or a headache that does not go away with flat bed rest, call [phone].  5. You may resume normal activities after your 24 hours of bed rest is over; however, do not exert yourself strongly or do any heavy lifting tomorrow. If when you get up you have a headache when standing, go back to bed and force fluids for another 24 hours.  6. Call your physician for a follow-up appointment.  The results of your myelogram will be sent directly to your physician by the following day.  7. If you have any questions or if complications develop after you arrive home, please call [phone].  Discharge instructions have been explained to the patient.  The patient, or the person responsible for the patient, fully understands these instructions.       May resume Cymbalta and Tramadol on May 08, 2014, after 8:30 am.

## 2014-05-07 NOTE — Progress Notes (Signed)
Pt states she has been off Cymbalta and Tramadol for the past 2 days. Discharge instructions explained to pt.

## 2014-05-10 ENCOUNTER — Other Ambulatory Visit: Payer: Self-pay | Admitting: Neurosurgery

## 2014-05-10 DIAGNOSIS — Z6839 Body mass index (BMI) 39.0-39.9, adult: Secondary | ICD-10-CM | POA: Diagnosis not present

## 2014-05-10 DIAGNOSIS — M5137 Other intervertebral disc degeneration, lumbosacral region: Secondary | ICD-10-CM | POA: Diagnosis not present

## 2014-05-10 DIAGNOSIS — I10 Essential (primary) hypertension: Secondary | ICD-10-CM | POA: Diagnosis not present

## 2014-05-12 NOTE — Pre-Procedure Instructions (Signed)
[name]  05/12/2014   Your procedure is scheduled on:  Friday May 14, 2014 at 11:45 AM.  Report to [facility] Admitting at 8:45 AM.  Call this number if you have problems the morning of surgery: [phone]   Remember:   Do not eat food or drink liquids after midnight.   Take these medicines the morning of surgery with A SIP OF WATER: Levothyroxine (Synthroid), Metoprolol (Toprol XL), Ranitidine (Zantac), and Tramadol   Please stop taking any vitamins, Ibuprofen, Advil, Motrin, Alleve, herbal medications, etc   Do not wear jewelry, make-up or nail polish.  Do not wear lotions, powders, or perfumes.  Do not shave 48 hours prior to surgery.   Do not bring valuables to the hospital.  [facility] is not responsible for any belongings or valuables.               Contacts, dentures or bridgework may not be worn into surgery.  Leave suitcase in the car. After surgery it may be brought to your room.  For patients admitted to the hospital, discharge time is determined by your treatment team.               Patients discharged the day of surgery will not be allowed to drive home.  Name and phone number of your driver:   Special Instructions: Shower using CHG soap the night before and the morning of your surgery   Please read over the following fact sheets that you were given: Pain Booklet, Coughing and Deep Breathing, Blood Transfusion Information, MRSA Information and Surgical Site Infection Prevention

## 2014-05-13 ENCOUNTER — Encounter (HOSPITAL_COMMUNITY)
Admission: RE | Admit: 2014-05-13 | Discharge: 2014-05-13 | Disposition: A | Discharge status: Home / Self Care | Payer: Medicare Other | Source: Ambulatory Visit | Attending: Neurosurgery | Admitting: Neurosurgery

## 2014-05-13 ENCOUNTER — Encounter (HOSPITAL_COMMUNITY): Payer: Self-pay

## 2014-05-13 DIAGNOSIS — Z87891 Personal history of nicotine dependence: Secondary | ICD-10-CM | POA: Diagnosis not present

## 2014-05-13 DIAGNOSIS — I10 Essential (primary) hypertension: Secondary | ICD-10-CM | POA: Diagnosis not present

## 2014-05-13 DIAGNOSIS — M4316 Spondylolisthesis, lumbar region: Secondary | ICD-10-CM | POA: Diagnosis not present

## 2014-05-13 DIAGNOSIS — E039 Hypothyroidism, unspecified: Secondary | ICD-10-CM | POA: Diagnosis not present

## 2014-05-13 HISTORY — DX: Dermatitis, unspecified: L30.9

## 2014-05-13 HISTORY — DX: Hypothyroidism, unspecified: E03.9

## 2014-05-13 HISTORY — DX: Benign lipomatous neoplasm, unspecified: D17.9

## 2014-05-13 HISTORY — DX: Personal history of other diseases of urinary system: Z87.448

## 2014-05-13 HISTORY — DX: Anesthesia of skin: R20.0

## 2014-05-13 HISTORY — DX: Xerosis cutis: L85.3

## 2014-05-13 HISTORY — DX: Paresthesia of skin: R20.2

## 2014-05-13 HISTORY — DX: Urinary tract infection, site not specified: N39.0

## 2014-05-13 LAB — CBC
HCT: 42.2 % (ref 36.0–46.0)
Hemoglobin: 13.8 g/dL (ref 12.0–15.0)
MCH: 27.8 pg (ref 26.0–34.0)
MCHC: 32.7 g/dL (ref 30.0–36.0)
MCV: 84.9 fL (ref 78.0–100.0)
Platelets: 386 10*3/uL (ref 150–400)
RBC: 4.97 MIL/uL (ref 3.87–5.11)
RDW: 14.8 % (ref 11.5–15.5)
WBC: 10.7 10*3/uL — ABNORMAL HIGH (ref 4.0–10.5)

## 2014-05-13 LAB — TYPE AND SCREEN
ABO/RH(D): A NEG
Antibody Screen: NEGATIVE

## 2014-05-13 LAB — BASIC METABOLIC PANEL
Anion gap: 9 (ref 5–15)
BUN: 9 mg/dL (ref 6–23)
CO2: 26 mmol/L (ref 19–32)
Calcium: 9.6 mg/dL (ref 8.4–10.5)
Chloride: 105 mmol/L (ref 96–112)
Creatinine, Ser: 0.72 mg/dL (ref 0.50–1.10)
GFR calc Af Amer: >90 mL/min (ref >90)
GFR calc non Af Amer: 82 mL/min — ABNORMAL LOW (ref >90)
Glucose, Bld: 96 mg/dL (ref 70–99)
Potassium: 3.6 mmol/L (ref 3.5–5.1)
Sodium: 140 mmol/L (ref 135–145)

## 2014-05-13 LAB — SURGICAL PCR SCREEN
MRSA, PCR: NEGATIVE
Staphylococcus aureus: POSITIVE — AB

## 2014-05-13 LAB — ABO/RH: ABO/RH(D): A NEG

## 2014-05-13 NOTE — H&P (Signed)
Ashley Mercer is an 74 y.o. female.   Chief Complaint: lumbar pain HPI: patient who came to my office complaining of lumbar pain with radiation to both legs, no better with conservative treatment.walking is quite impossible with frequents stops to relief the pain. The best pain maneuver is siting and flexing the lumbar spine. Had a myelogram and was seen with her husband and daughter  Past Medical History  Diagnosis Date  . Hypertension   . Thyroid condition   . Chronic back pain   . Cervical spinal stenosis   . Hypothyroidism   . Frequent UTI   . Dry skin   . Eczema   . History of blood in urine     Pt was seen by [facility]; scant amount; no current issues  . Fatty tumor     Right wrist  . Numbness and tingling in hands     Bilateral    Past Surgical History  Procedure Laterality Date  . Thyroidectomy  1971  . Total abdominal hysterectomy  1986  . Cervical spine surgery  01/28/2009  . Breast biopsy Right 2006    benign  . Colonoscopy w/ polypectomy    . Tonsillectomy    . Appendectomy      Family History  Problem Relation Age of Onset  . Heart disease Mother   . Kidney failure Father    Social History:  reports that she has quit smoking. She has never used smokeless tobacco. She reports that she drinks alcohol. She reports that she does not use illicit drugs.  Allergies: No Known Allergies  No prescriptions prior to admission    Results for orders placed or performed during the hospital encounter of 05/13/14 (from the past 48 hour(s))  Surgical pcr screen     Status: Abnormal   Collection Time: 05/13/14 12:08 PM  Result Value Ref Range   MRSA, PCR NEGATIVE NEGATIVE   Staphylococcus aureus POSITIVE (A) NEGATIVE    Comment:        The Xpert SA Assay (FDA approved for NASAL specimens in patients over 64 years of age), is one component of a comprehensive surveillance program.  Test performance has been validated by [facility] for patients greater than  or equal to 63 year old. It is not intended to diagnose infection nor to guide or monitor treatment.   CBC     Status: Abnormal   Collection Time: 05/13/14 12:09 PM  Result Value Ref Range   WBC 10.7 (H) 4.0 - 10.5 K/uL   RBC 4.97 3.87 - 5.11 MIL/uL   Hemoglobin 13.8 12.0 - 15.0 g/dL   HCT 42.2 36.0 - 46.0 %   MCV 84.9 78.0 - 100.0 fL   MCH 27.8 26.0 - 34.0 pg   MCHC 32.7 30.0 - 36.0 g/dL   RDW 14.8 11.5 - 15.5 %   Platelets 386 150 - 400 K/uL  Basic metabolic panel     Status: Abnormal   Collection Time: 05/13/14 12:09 PM  Result Value Ref Range   Sodium 140 135 - 145 mmol/L   Potassium 3.6 3.5 - 5.1 mmol/L   Chloride 105 96 - 112 mmol/L   CO2 26 19 - 32 mmol/L   Glucose, Bld 96 70 - 99 mg/dL   BUN 9 6 - 23 mg/dL   Creatinine, Ser 0.72 0.50 - 1.10 mg/dL   Calcium 9.6 8.4 - 10.5 mg/dL   GFR calc non Af Amer 82 (L) >90 mL/min   GFR calc Af  Amer >90 >90 mL/min    Comment: (NOTE) The eGFR has been calculated using the CKD EPI equation. This calculation has not been validated in all clinical situations. eGFR's persistently <90 mL/min signify possible Chronic Kidney Disease.    Anion gap 9 5 - 15  Type and screen     Status: None   Collection Time: 05/13/14 12:09 PM  Result Value Ref Range   ABO/RH(D) A NEG    Antibody Screen NEG    Sample Expiration 05/27/2014   ABO/Rh     Status: None   Collection Time: 05/13/14 12:09 PM  Result Value Ref Range   ABO/RH(D) A NEG    No results found.  Review of Systems  Constitutional: Negative.   HENT: Negative.   Eyes: Negative.   Respiratory: Negative.   Cardiovascular: Negative.   Gastrointestinal: Negative.   Genitourinary: Negative.   Musculoskeletal: Positive for back pain.  Skin: Negative.   Neurological: Positive for sensory change and focal weakness.  Endo/Heme/Allergies: Negative.   Psychiatric/Behavioral: Negative.     There were no vitals taken for this visit. Physical Exam  Hent, nl. Neck, anterior scar. Cv,  nl. Lungs clear. Abdomen nl. Extremities, nl. NEURO femoral stretch maneuver is positive bilaterally. SLR positive at 30 degrees. Myelo shows lumbar degenerative disc disease in the thoracic and lumbar spine. In the latter she has scoliosis with spondylolisthesis worse at K83,01,59 Assessment/Plan decompression and fusion at Z68,95,70.  She and her family are aware of risks and benefits See above  Ilai Hiller M 05/13/2014, 5:32 PM

## 2014-05-13 NOTE — Progress Notes (Signed)
Anesthesia Chart Review:  Pt is 74 year old female scheduled for L2-3, L3-4, L4-5 PLIF on 05/14/2014 with Dr. Joya Salm.   PMH includes: HTN, hypothyroidism, LBBB. Former smoker. BMI 39.  Medications include: ASA, levothyroxine, metoprolol, quinapril-hctz, evista.   Preoperative labs reviewed.    EKG: sinus bradycardia (56 bpm). Possible LA enlargement. LBBB. No significant change since tracing from 2002.   Pt reports was dx and worked up for LBBB in 1986. Does not see cardiologist anymore.   Reviewed case with Dr. Al Corpus.   If no changes, I anticipate pt can proceed with surgery as scheduled.   Willeen Cass, FNP-BC [facility]/Anesthesiology Phone: [phone] 05/13/2014 4:36 PM

## 2014-05-13 NOTE — Progress Notes (Signed)
Nurse called patient and informed her of positive PCR results. Will start ointment DOS.   Patient informed Nurse that she saw her EKG and that she has had a left bundle branch block since "86" and that she had a entire cardiac workup and she was fine. However patient stated that left atrial enlargement she saw on EKG was new. Will send to [place], [place] for review.

## 2014-05-14 ENCOUNTER — Encounter (HOSPITAL_COMMUNITY)
Admission: RE | Disposition: A | Discharge status: Home Health | Payer: Medicare Other | Source: Ambulatory Visit | Attending: Neurosurgery

## 2014-05-14 ENCOUNTER — Inpatient Hospital Stay (HOSPITAL_COMMUNITY): Payer: Medicare Other | Admitting: Vascular Surgery

## 2014-05-14 ENCOUNTER — Inpatient Hospital Stay (HOSPITAL_COMMUNITY)
Admission: RE | Admit: 2014-05-14 | Discharge: 2014-05-20 | DRG: 460 | Disposition: A | Discharge status: Home Health | Payer: Medicare Other | Source: Ambulatory Visit | Attending: Neurosurgery | Admitting: Neurosurgery

## 2014-05-14 ENCOUNTER — Encounter (HOSPITAL_COMMUNITY): Payer: Self-pay | Admitting: *Deleted

## 2014-05-14 ENCOUNTER — Inpatient Hospital Stay (HOSPITAL_COMMUNITY): Payer: Medicare Other

## 2014-05-14 ENCOUNTER — Inpatient Hospital Stay (HOSPITAL_COMMUNITY): Payer: Medicare Other | Admitting: Anesthesiology

## 2014-05-14 DIAGNOSIS — I10 Essential (primary) hypertension: Secondary | ICD-10-CM | POA: Diagnosis present

## 2014-05-14 DIAGNOSIS — E039 Hypothyroidism, unspecified: Secondary | ICD-10-CM | POA: Diagnosis present

## 2014-05-14 DIAGNOSIS — Z87891 Personal history of nicotine dependence: Secondary | ICD-10-CM | POA: Diagnosis not present

## 2014-05-14 DIAGNOSIS — Z419 Encounter for procedure for purposes other than remedying health state, unspecified: Secondary | ICD-10-CM

## 2014-05-14 DIAGNOSIS — M4316 Spondylolisthesis, lumbar region: Secondary | ICD-10-CM | POA: Diagnosis present

## 2014-05-14 DIAGNOSIS — Z4789 Encounter for other orthopedic aftercare: Secondary | ICD-10-CM | POA: Diagnosis not present

## 2014-05-14 DIAGNOSIS — M545 Low back pain: Secondary | ICD-10-CM | POA: Diagnosis not present

## 2014-05-14 DIAGNOSIS — M4126 Other idiopathic scoliosis, lumbar region: Secondary | ICD-10-CM | POA: Diagnosis not present

## 2014-05-14 DIAGNOSIS — M5137 Other intervertebral disc degeneration, lumbosacral region: Secondary | ICD-10-CM | POA: Diagnosis not present

## 2014-05-14 DIAGNOSIS — M5116 Intervertebral disc disorders with radiculopathy, lumbar region: Secondary | ICD-10-CM | POA: Diagnosis not present

## 2014-05-14 DIAGNOSIS — Z981 Arthrodesis status: Secondary | ICD-10-CM | POA: Diagnosis not present

## 2014-05-14 SURGERY — POSTERIOR LUMBAR FUSION 3 LEVEL
Anesthesia: General | Site: Spine Lumbar

## 2014-05-14 MED ORDER — FENTANYL CITRATE (PF) 100 MCG/2ML IJ SOLN
INTRAMUSCULAR | Status: DC | PRN
  Administered 2014-05-14 (×3): 100 ug via INTRAVENOUS
  Administered 2014-05-14 (×3): 50 ug via INTRAVENOUS

## 2014-05-14 MED ORDER — CEFAZOLIN SODIUM 1-5 GM-% IV SOLN
1.0000 g | Freq: Three times a day (TID) | INTRAVENOUS | Status: AC
  Administered 2014-05-14 – 2014-05-15 (×2): 1 g via INTRAVENOUS
  Filled 2014-05-14 (×2): qty 50

## 2014-05-14 MED ORDER — DEXTROSE 5 % IV SOLN
10.0000 mg | INTRAVENOUS | Status: DC | PRN
  Administered 2014-05-14: 25 ug/min via INTRAVENOUS

## 2014-05-14 MED ORDER — LACTATED RINGERS IV SOLN
INTRAVENOUS | Status: DC
  Administered 2014-05-14: 10:00:00 via INTRAVENOUS

## 2014-05-14 MED ORDER — GEMFIBROZIL 600 MG PO TABS
600.0000 mg | ORAL_TABLET | Freq: Every day | ORAL | Status: DC
  Administered 2014-05-16 – 2014-05-20 (×5): 600 mg via ORAL
  Filled 2014-05-14 (×6): qty 1

## 2014-05-14 MED ORDER — EPHEDRINE SULFATE 50 MG/ML IJ SOLN
INTRAMUSCULAR | Status: AC
  Filled 2014-05-14: qty 1

## 2014-05-14 MED ORDER — COLESEVELAM HCL 625 MG PO TABS
1250.0000 mg | ORAL_TABLET | Freq: Two times a day (BID) | ORAL | Status: DC
Start: 2014-05-15 — End: 2014-05-20
  Administered 2014-05-16 – 2014-05-20 (×9): 1250 mg via ORAL
  Filled 2014-05-14 (×14): qty 2

## 2014-05-14 MED ORDER — PHENYLEPHRINE 40 MCG/ML (10ML) SYRINGE FOR IV PUSH (FOR BLOOD PRESSURE SUPPORT)
PREFILLED_SYRINGE | INTRAVENOUS | Status: AC
  Filled 2014-05-14: qty 10

## 2014-05-14 MED ORDER — LEVOTHYROXINE SODIUM 100 MCG PO TABS
100.0000 ug | ORAL_TABLET | Freq: Every day | ORAL | Status: DC
  Administered 2014-05-16 – 2014-05-20 (×5): 100 ug via ORAL
  Filled 2014-05-14 (×5): qty 1

## 2014-05-14 MED ORDER — ACETAMINOPHEN 650 MG RE SUPP: 650.0000 mg | RECTAL | Status: DC | PRN

## 2014-05-14 MED ORDER — DULOXETINE HCL 60 MG PO CPEP
60.0000 mg | ORAL_CAPSULE | Freq: Every day | ORAL | Status: DC
Start: 2014-05-14 — End: 2014-05-20
  Administered 2014-05-14 – 2014-05-19 (×6): 60 mg via ORAL
  Filled 2014-05-14 (×6): qty 1

## 2014-05-14 MED ORDER — BUPIVACAINE LIPOSOME 1.3 % IJ SUSP
INTRAMUSCULAR | Status: DC | PRN
Start: 2014-05-14 — End: 2014-05-14
  Administered 2014-05-14: 20 mL

## 2014-05-14 MED ORDER — ACETAMINOPHEN 325 MG PO TABS
650.0000 mg | ORAL_TABLET | ORAL | Status: DC | PRN
  Administered 2014-05-15: 650 mg via ORAL
  Filled 2014-05-14: qty 2

## 2014-05-14 MED ORDER — METOPROLOL SUCCINATE ER 100 MG PO TB24
100.0000 mg | ORAL_TABLET | Freq: Every day | ORAL | Status: DC
  Administered 2014-05-17 – 2014-05-20 (×4): 100 mg via ORAL
  Filled 2014-05-14 (×5): qty 1

## 2014-05-14 MED ORDER — PROPOFOL 10 MG/ML IV BOLUS
INTRAVENOUS | Status: AC
Start: 2014-05-14 — End: 2014-05-14
  Filled 2014-05-14: qty 20

## 2014-05-14 MED ORDER — GLYCOPYRROLATE 0.2 MG/ML IJ SOLN
INTRAMUSCULAR | Status: DC | PRN
  Administered 2014-05-14: 0.2 mg via INTRAVENOUS

## 2014-05-14 MED ORDER — 0.9 % SODIUM CHLORIDE (POUR BTL) OPTIME
TOPICAL | Status: DC | PRN
  Administered 2014-05-14: 1000 mL

## 2014-05-14 MED ORDER — RALOXIFENE HCL 60 MG PO TABS
60.0000 mg | ORAL_TABLET | Freq: Every day | ORAL | Status: DC
  Administered 2014-05-16 – 2014-05-20 (×5): 60 mg via ORAL
  Filled 2014-05-14 (×6): qty 1

## 2014-05-14 MED ORDER — GLYCOPYRROLATE 0.2 MG/ML IJ SOLN
INTRAMUSCULAR | Status: AC
  Filled 2014-05-14: qty 1

## 2014-05-14 MED ORDER — CEFAZOLIN SODIUM-DEXTROSE 2-3 GM-% IV SOLR
2.0000 g | INTRAVENOUS | Status: AC
  Administered 2014-05-14: 2 g via INTRAVENOUS

## 2014-05-14 MED ORDER — SODIUM CHLORIDE 0.9 % IV SOLN
INTRAVENOUS | Status: DC
  Administered 2014-05-14: 75 mL/h via INTRAVENOUS
  Administered 2014-05-15: 21:00:00 via INTRAVENOUS

## 2014-05-14 MED ORDER — EPHEDRINE SULFATE 50 MG/ML IJ SOLN
INTRAMUSCULAR | Status: DC | PRN
Start: 2014-05-14 — End: 2014-05-14
  Administered 2014-05-14 (×3): 10 mg via INTRAVENOUS
  Administered 2014-05-14: 5 mg via INTRAVENOUS

## 2014-05-14 MED ORDER — MUPIROCIN 2 % EX OINT
1.0000 "application " | TOPICAL_OINTMENT | Freq: Two times a day (BID) | CUTANEOUS | Status: DC
  Administered 2014-05-14: 1 via TOPICAL

## 2014-05-14 MED ORDER — LISINOPRIL 20 MG PO TABS
20.0000 mg | ORAL_TABLET | Freq: Every day | ORAL | Status: DC
  Administered 2014-05-18 – 2014-05-20 (×3): 20 mg via ORAL
  Filled 2014-05-14 (×5): qty 1

## 2014-05-14 MED ORDER — PROPOFOL 10 MG/ML IV BOLUS
INTRAVENOUS | Status: DC | PRN
  Administered 2014-05-14: 140 mg via INTRAVENOUS
  Administered 2014-05-14: 20 mg via INTRAVENOUS

## 2014-05-14 MED ORDER — ROCURONIUM BROMIDE 100 MG/10ML IV SOLN
INTRAVENOUS | Status: DC | PRN
  Administered 2014-05-14: 20 mg via INTRAVENOUS
  Administered 2014-05-14: 50 mg via INTRAVENOUS

## 2014-05-14 MED ORDER — VANCOMYCIN HCL 1000 MG IV SOLR
INTRAVENOUS | Status: AC
  Filled 2014-05-14: qty 2000

## 2014-05-14 MED ORDER — ONDANSETRON HCL 4 MG/2ML IJ SOLN: 4.0000 mg | Freq: Four times a day (QID) | INTRAMUSCULAR | Status: DC | PRN

## 2014-05-14 MED ORDER — FAMOTIDINE 20 MG PO TABS
20.0000 mg | ORAL_TABLET | Freq: Every day | ORAL | Status: DC
  Administered 2014-05-16 – 2014-05-20 (×5): 20 mg via ORAL
  Filled 2014-05-14 (×5): qty 1

## 2014-05-14 MED ORDER — MUPIROCIN 2 % EX OINT
TOPICAL_OINTMENT | CUTANEOUS | Status: AC
  Administered 2014-05-14: 1 via TOPICAL
  Filled 2014-05-14: qty 22

## 2014-05-14 MED ORDER — PHENYLEPHRINE HCL 10 MG/ML IJ SOLN
INTRAMUSCULAR | Status: DC | PRN
  Administered 2014-05-14: 80 ug via INTRAVENOUS

## 2014-05-14 MED ORDER — QUINAPRIL-HYDROCHLOROTHIAZIDE 20-25 MG PO TABS: 1.0000 | ORAL_TABLET | Freq: Every day | ORAL | Status: DC

## 2014-05-14 MED ORDER — CEFAZOLIN SODIUM-DEXTROSE 2-3 GM-% IV SOLR
INTRAVENOUS | Status: AC
  Administered 2014-05-14: 2 g via INTRAVENOUS
  Filled 2014-05-14: qty 50

## 2014-05-14 MED ORDER — HYDROMORPHONE HCL 1 MG/ML IJ SOLN
INTRAMUSCULAR | Status: AC
  Filled 2014-05-14: qty 1

## 2014-05-14 MED ORDER — LACTATED RINGERS IV SOLN
INTRAVENOUS | Status: DC | PRN
  Administered 2014-05-14 (×3): via INTRAVENOUS

## 2014-05-14 MED ORDER — MENTHOL 3 MG MT LOZG: 1.0000 | LOZENGE | OROMUCOSAL | Status: DC | PRN

## 2014-05-14 MED ORDER — CEFAZOLIN SODIUM-DEXTROSE 2-3 GM-% IV SOLR
INTRAVENOUS | Status: AC
  Filled 2014-05-14: qty 50

## 2014-05-14 MED ORDER — HYDROCODONE-ACETAMINOPHEN 7.5-325 MG PO TABS: 1.0000 | ORAL_TABLET | Freq: Once | ORAL | Status: DC | PRN

## 2014-05-14 MED ORDER — HYDROCHLOROTHIAZIDE 25 MG PO TABS
25.0000 mg | ORAL_TABLET | Freq: Every day | ORAL | Status: DC
  Administered 2014-05-17 – 2014-05-20 (×4): 25 mg via ORAL
  Filled 2014-05-14 (×5): qty 1

## 2014-05-14 MED ORDER — OXYCODONE-ACETAMINOPHEN 5-325 MG PO TABS
1.0000 | ORAL_TABLET | ORAL | Status: DC | PRN
  Administered 2014-05-15 – 2014-05-19 (×16): 2 via ORAL
  Administered 2014-05-19 (×2): 1 via ORAL
  Administered 2014-05-19 – 2014-05-20 (×3): 2 via ORAL
  Filled 2014-05-14 (×9): qty 2
  Filled 2014-05-14: qty 1
  Filled 2014-05-14 (×8): qty 2
  Filled 2014-05-14: qty 1
  Filled 2014-05-14 (×2): qty 2

## 2014-05-14 MED ORDER — VANCOMYCIN HCL 1000 MG IV SOLR
INTRAVENOUS | Status: DC | PRN
  Administered 2014-05-14: 1000 mg via TOPICAL

## 2014-05-14 MED ORDER — LIDOCAINE HCL (CARDIAC) 20 MG/ML IV SOLN
INTRAVENOUS | Status: DC | PRN
  Administered 2014-05-14: 50 mg via INTRAVENOUS

## 2014-05-14 MED ORDER — LIDOCAINE HCL (CARDIAC) 20 MG/ML IV SOLN
INTRAVENOUS | Status: AC
  Filled 2014-05-14: qty 5

## 2014-05-14 MED ORDER — PHENOL 1.4 % MT LIQD: 1.0000 | OROMUCOSAL | Status: DC | PRN

## 2014-05-14 MED ORDER — NALOXONE HCL 0.4 MG/ML IJ SOLN: 0.4000 mg | INTRAMUSCULAR | Status: DC | PRN

## 2014-05-14 MED ORDER — ONDANSETRON HCL 4 MG/2ML IJ SOLN: 4.0000 mg | Freq: Once | INTRAMUSCULAR | Status: DC | PRN

## 2014-05-14 MED ORDER — BUPIVACAINE LIPOSOME 1.3 % IJ SUSP
20.0000 mL | INTRAMUSCULAR | Status: AC
  Filled 2014-05-14: qty 20

## 2014-05-14 MED ORDER — SODIUM CHLORIDE 0.9 % IJ SOLN: 9.0000 mL | INTRAMUSCULAR | Status: DC | PRN

## 2014-05-14 MED ORDER — HEMOSTATIC AGENTS (NO CHARGE) OPTIME
TOPICAL | Status: DC | PRN
  Administered 2014-05-14: 1 via TOPICAL

## 2014-05-14 MED ORDER — SODIUM CHLORIDE 0.9 % IV SOLN: 250.0000 mL | INTRAVENOUS | Status: DC

## 2014-05-14 MED ORDER — PHENYLEPHRINE HCL 10 MG/ML IJ SOLN
INTRAMUSCULAR | Status: AC
  Filled 2014-05-14: qty 1

## 2014-05-14 MED ORDER — ROCURONIUM BROMIDE 50 MG/5ML IV SOLN
INTRAVENOUS | Status: AC
  Filled 2014-05-14: qty 1

## 2014-05-14 MED ORDER — MIDAZOLAM HCL 2 MG/2ML IJ SOLN
INTRAMUSCULAR | Status: AC
  Filled 2014-05-14: qty 2

## 2014-05-14 MED ORDER — FENTANYL CITRATE (PF) 250 MCG/5ML IJ SOLN
INTRAMUSCULAR | Status: AC
  Filled 2014-05-14: qty 5

## 2014-05-14 MED ORDER — SODIUM CHLORIDE 0.9 % IJ SOLN
3.0000 mL | Freq: Two times a day (BID) | INTRAMUSCULAR | Status: DC
  Administered 2014-05-15 – 2014-05-19 (×5): 3 mL via INTRAVENOUS

## 2014-05-14 MED ORDER — MORPHINE SULFATE (PF) 1 MG/ML IV SOLN
INTRAVENOUS | Status: DC
  Administered 2014-05-14 (×2): 1.5 mg via INTRAVENOUS
  Administered 2014-05-14: 12 mg via INTRAVENOUS
  Administered 2014-05-15: 18.85 mg via INTRAVENOUS
  Administered 2014-05-15: 6 mg via INTRAVENOUS
  Administered 2014-05-15: 07:00:00 via INTRAVENOUS
  Filled 2014-05-14: qty 25

## 2014-05-14 MED ORDER — ONDANSETRON HCL 4 MG/2ML IJ SOLN
4.0000 mg | INTRAMUSCULAR | Status: DC | PRN
  Administered 2014-05-19: 4 mg via INTRAVENOUS
  Filled 2014-05-14: qty 2

## 2014-05-14 MED ORDER — MIDAZOLAM HCL 5 MG/5ML IJ SOLN
INTRAMUSCULAR | Status: DC | PRN
  Administered 2014-05-14: 2 mg via INTRAVENOUS

## 2014-05-14 MED ORDER — ONDANSETRON HCL 4 MG/2ML IJ SOLN
INTRAMUSCULAR | Status: AC
  Filled 2014-05-14: qty 2

## 2014-05-14 MED ORDER — SODIUM CHLORIDE 0.9 % IJ SOLN: 3.0000 mL | INTRAMUSCULAR | Status: DC | PRN

## 2014-05-14 MED ORDER — DIPHENHYDRAMINE HCL 12.5 MG/5ML PO ELIX
12.5000 mg | ORAL_SOLUTION | Freq: Four times a day (QID) | ORAL | Status: DC | PRN
  Administered 2014-05-15: 12.5 mg via ORAL
  Filled 2014-05-14: qty 10

## 2014-05-14 MED ORDER — THROMBIN 5000 UNITS EX SOLR
CUTANEOUS | Status: DC | PRN
  Administered 2014-05-14 (×4): 5000 [IU] via TOPICAL

## 2014-05-14 MED ORDER — DIPHENHYDRAMINE HCL 50 MG/ML IJ SOLN: 12.5000 mg | Freq: Four times a day (QID) | INTRAMUSCULAR | Status: DC | PRN

## 2014-05-14 MED ORDER — ASPIRIN EC 81 MG PO TBEC
81.0000 mg | DELAYED_RELEASE_TABLET | Freq: Every day | ORAL | Status: DC
  Administered 2014-05-14 – 2014-05-20 (×6): 81 mg via ORAL
  Filled 2014-05-14 (×6): qty 1

## 2014-05-14 MED ORDER — DIAZEPAM 5 MG PO TABS
5.0000 mg | ORAL_TABLET | Freq: Four times a day (QID) | ORAL | Status: DC | PRN
  Administered 2014-05-15 – 2014-05-20 (×7): 5 mg via ORAL
  Filled 2014-05-14 (×7): qty 1

## 2014-05-14 MED ORDER — HYDROMORPHONE HCL 1 MG/ML IJ SOLN
0.2500 mg | INTRAMUSCULAR | Status: DC | PRN
  Administered 2014-05-14 (×5): 0.25 mg via INTRAVENOUS

## 2014-05-14 MED ORDER — POVIDONE-IODINE 10 % EX OINT
TOPICAL_OINTMENT | CUTANEOUS | Status: DC | PRN
  Administered 2014-05-14: 1 via TOPICAL

## 2014-05-14 MED ORDER — MORPHINE SULFATE (PF) 1 MG/ML IV SOLN
INTRAVENOUS | Status: AC
  Filled 2014-05-14: qty 25

## 2014-05-14 SURGICAL SUPPLY — 76 items
BENZOIN TINCTURE PRP APPL 2/3 (GAUZE/BANDAGES/DRESSINGS) ×2 IMPLANT
BLADE CLIPPER SURG (BLADE) IMPLANT
BUR ACORN 6.0 (BURR) ×2 IMPLANT
BUR MATCHSTICK NEURO 3.0 LAGG (BURR) ×2 IMPLANT
CANISTER SUCT 3000ML PPV (MISCELLANEOUS) ×2 IMPLANT
CAP LOCKING THREADED (Cap) ×16 IMPLANT
CONT SPEC 4OZ CLIKSEAL STRL BL (MISCELLANEOUS) ×4 IMPLANT
COVER BACK TABLE 60X90IN (DRAPES) ×2 IMPLANT
CROSSLINK SPINAL FUSION (Cage) ×2 IMPLANT
DRAPE C-ARM 42X72 X-RAY (DRAPES) ×4 IMPLANT
DRAPE LAPAROTOMY 100X72X124 (DRAPES) ×2 IMPLANT
DRAPE POUCH INSTRU U-SHP 10X18 (DRAPES) ×2 IMPLANT
DRSG OPSITE POSTOP 4X10 (GAUZE/BANDAGES/DRESSINGS) ×4 IMPLANT
DRSG PAD ABDOMINAL 8X10 ST (GAUZE/BANDAGES/DRESSINGS) IMPLANT
DURAPREP 26ML APPLICATOR (WOUND CARE) ×2 IMPLANT
ELECT BLADE 4.0 EZ CLEAN MEGAD (MISCELLANEOUS) ×2
ELECT REM PT RETURN 9FT ADLT (ELECTROSURGICAL) ×2
ELECTRODE BLDE 4.0 EZ CLN MEGD (MISCELLANEOUS) ×1 IMPLANT
ELECTRODE REM PT RTRN 9FT ADLT (ELECTROSURGICAL) ×1 IMPLANT
EVACUATOR 1/8 PVC DRAIN (DRAIN) ×2 IMPLANT
GAUZE SPONGE 4X4 12PLY STRL (GAUZE/BANDAGES/DRESSINGS) IMPLANT
GAUZE SPONGE 4X4 16PLY XRAY LF (GAUZE/BANDAGES/DRESSINGS) ×4 IMPLANT
GLOVE BIO SURGEON STRL SZ 6.5 (GLOVE) ×4 IMPLANT
GLOVE BIO SURGEON STRL SZ7 (GLOVE) IMPLANT
GLOVE BIOGEL M 8.0 STRL (GLOVE) ×2 IMPLANT
GLOVE BIOGEL PI IND STRL 6.5 (GLOVE) ×1 IMPLANT
GLOVE BIOGEL PI IND STRL 7.5 (GLOVE) ×4 IMPLANT
GLOVE BIOGEL PI INDICATOR 6.5 (GLOVE) ×1
GLOVE BIOGEL PI INDICATOR 7.5 (GLOVE) ×4
GLOVE ECLIPSE 6.5 STRL STRAW (GLOVE) ×2 IMPLANT
GLOVE EXAM NITRILE LRG STRL (GLOVE) IMPLANT
GLOVE EXAM NITRILE MD LF STRL (GLOVE) IMPLANT
GLOVE EXAM NITRILE XL STR (GLOVE) IMPLANT
GLOVE EXAM NITRILE XS STR PU (GLOVE) IMPLANT
GLOVE SURG SS PI 7.0 STRL IVOR (GLOVE) ×8 IMPLANT
GOWN STRL REUS W/ TWL LRG LVL3 (GOWN DISPOSABLE) ×2 IMPLANT
GOWN STRL REUS W/ TWL XL LVL3 (GOWN DISPOSABLE) ×5 IMPLANT
GOWN STRL REUS W/TWL 2XL LVL3 (GOWN DISPOSABLE) IMPLANT
GOWN STRL REUS W/TWL LRG LVL3 (GOWN DISPOSABLE) ×2
GOWN STRL REUS W/TWL XL LVL3 (GOWN DISPOSABLE) ×5
IMPLANT RISE 8X22 9-15MM (Neuro Prosthesis/Implant) ×2 IMPLANT
KIT BASIN OR (CUSTOM PROCEDURE TRAY) ×2 IMPLANT
KIT INFUSE LRG II (Orthopedic Implant) ×2 IMPLANT
KIT ROOM TURNOVER OR (KITS) ×2 IMPLANT
MILL MEDIUM DISP (BLADE) ×2 IMPLANT
NEEDLE HYPO 18GX1.5 BLUNT FILL (NEEDLE) IMPLANT
NEEDLE HYPO 21X1.5 SAFETY (NEEDLE) ×2 IMPLANT
NEEDLE HYPO 25X1 1.5 SAFETY (NEEDLE) ×2 IMPLANT
NS IRRIG 1000ML POUR BTL (IV SOLUTION) ×2 IMPLANT
PACK LAMINECTOMY NEURO (CUSTOM PROCEDURE TRAY) ×2 IMPLANT
PAD ARMBOARD 7.5X6 YLW CONV (MISCELLANEOUS) ×10 IMPLANT
PATTIES SURGICAL .5 X1 (DISPOSABLE) IMPLANT
PATTIES SURGICAL .5 X3 (DISPOSABLE) IMPLANT
PATTIES SURGICAL 1X1 (DISPOSABLE) ×2 IMPLANT
ROD CREO 100MM SPINAL (Rod) ×4 IMPLANT
SCREW POLY THRD CREO 6.5X40 (Screw) ×2 IMPLANT
SCREW SPINE 40X5.5XPA CREO (Screw) ×7 IMPLANT
SCREW SPINE CREO 5.5X40 (Screw) ×7 IMPLANT
SPACER RISE 8X22 11-17MM-15 (Spacer) ×2 IMPLANT
SPONGE LAP 4X18 X RAY DECT (DISPOSABLE) ×2 IMPLANT
SPONGE NEURO XRAY DETECT 1X3 (DISPOSABLE) IMPLANT
SPONGE SURGIFOAM ABS GEL 100 (HEMOSTASIS) ×2 IMPLANT
STAPLER VISISTAT 35W (STAPLE) ×2 IMPLANT
STRIP CLOSURE SKIN 1/2X4 (GAUZE/BANDAGES/DRESSINGS) ×2 IMPLANT
SUT NURALON 4 0 TR CR/8 (SUTURE) ×2 IMPLANT
SUT VIC AB 1 CT1 18XBRD ANBCTR (SUTURE) ×3 IMPLANT
SUT VIC AB 1 CT1 8-18 (SUTURE) ×3
SUT VIC AB 2-0 CP2 18 (SUTURE) ×4 IMPLANT
SUT VIC AB 3-0 SH 8-18 (SUTURE) ×4 IMPLANT
SYR 20CC LL (SYRINGE) ×2 IMPLANT
SYR 20ML ECCENTRIC (SYRINGE) ×2 IMPLANT
SYR 5ML LL (SYRINGE) IMPLANT
TOWEL OR 17X24 6PK STRL BLUE (TOWEL DISPOSABLE) ×2 IMPLANT
TOWEL OR 17X26 10 PK STRL BLUE (TOWEL DISPOSABLE) ×2 IMPLANT
TRAY FOLEY CATH 14FRSI W/METER (CATHETERS) ×2 IMPLANT
WATER STERILE IRR 1000ML POUR (IV SOLUTION) ×2 IMPLANT

## 2014-05-14 NOTE — Transfer of Care (Signed)
Immediate Anesthesia Transfer of Care Note  Patient: Ashley Mercer  Procedure(s) Performed: Procedure(s): LUMBAR TWO-THREE,LUMBAR THREE-FOUR,LUMBAR FOUR-FIVE POSTERIOR LUMBAR INTERBODY FUSION (N/A)  Patient Location: PACU  Anesthesia Type:General  Level of Consciousness: awake and alert   Airway & Oxygen Therapy: Patient Spontanous Breathing and Patient connected to face mask oxygen  Post-op Assessment: Report given to RN and Post -op Vital signs reviewed and stable  Post vital signs: Reviewed and stable  Last Vitals:  Filed Vitals:   05/14/14 1757  BP:   Pulse:   Temp:   Resp: 15    Complications: No apparent anesthesia complications

## 2014-05-14 NOTE — Anesthesia Preprocedure Evaluation (Signed)
Anesthesia Evaluation  Patient identified by MRN, date of birth, ID band Patient awake    Reviewed: Allergy & Precautions, NPO status , Patient's Chart, lab work & pertinent test results  Airway Mallampati: II  TM Distance: >3 FB Neck ROM: Full    Dental  (+) Teeth Intact, Dental Advisory Given   Pulmonary former smoker,  breath sounds clear to auscultation        Cardiovascular hypertension, Rhythm:Regular Rate:Normal     Neuro/Psych    GI/Hepatic   Endo/Other    Renal/GU      Musculoskeletal   Abdominal (+) + obese,   Peds  Hematology   Anesthesia Other Findings   Reproductive/Obstetrics                             Anesthesia Physical Anesthesia Plan  ASA: III  Anesthesia Plan: General   Post-op Pain Management:    Induction: Intravenous  Airway Management Planned: Oral ETT  Additional Equipment:   Intra-op Plan:   Post-operative Plan: Extubation in OR  Informed Consent: I have reviewed the patients History and Physical, chart, labs and discussed the procedure including the risks, benefits and alternatives for the proposed anesthesia with the patient or authorized representative who has indicated his/her understanding and acceptance.   Dental advisory given  Plan Discussed with: CRNA and Anesthesiologist  Anesthesia Plan Comments: (Hypertension Chronic LBB asymptomatic Obesity Lumbar spondylosis  Plan GA with oral ETT  Roberts Gaudy, MD)        Anesthesia Quick Evaluation

## 2014-05-14 NOTE — Anesthesia Procedure Notes (Signed)
Procedure Name: Intubation Date/Time: 05/14/2014 12:19 PM Performed by: Manus Gunning, Journei Thomassen J Pre-anesthesia Checklist: Patient identified, Timeout performed, Emergency Drugs available, Suction available and Patient being monitored Patient Re-evaluated:Patient Re-evaluated prior to inductionOxygen Delivery Method: Circle system utilized Preoxygenation: Pre-oxygenation with 100% oxygen Intubation Type: IV induction Ventilation: Mask ventilation without difficulty Laryngoscope Size: Mac and 3 Grade View: Grade I Tube type: Oral Tube size: 7.0 mm Number of attempts: 1 Placement Confirmation: ETT inserted through vocal cords under direct vision,  positive ETCO2 and breath sounds checked- equal and bilateral Secured at: 21 cm Tube secured with: Tape Dental Injury: Teeth and Oropharynx as per pre-operative assessment

## 2014-05-15 MED ORDER — DOCUSATE SODIUM 100 MG PO CAPS
200.0000 mg | ORAL_CAPSULE | Freq: Every day | ORAL | Status: DC
  Administered 2014-05-15 – 2014-05-17 (×3): 200 mg via ORAL
  Filled 2014-05-15 (×3): qty 2

## 2014-05-15 NOTE — Evaluation (Signed)
Occupational Therapy Evaluation Patient Details Name: Ashley Mercer MRN: [id number] DOB: Sep 28, 1940 Today's Date: 05/15/2014    History of Present Illness Pt underwent ALIF 05/14/14.   Clinical Impression   Patient is s/p ALIF surgery resulting in functional limitations due to the deficits listed below (see OT problem list). PTA living at home independent level. Patient will benefit from skilled OT acutely to increase independence and safety with ADLS to allow discharge [place]. Pt will need to incr ability to complete basic transfer or d/c recommendation could require higher level of care     Follow Up Recommendations  Home health OT;Supervision - Intermittent    Equipment Recommendations  3 in 1 bedside comode;Other (comment) (RW)    Recommendations for Other Services       Precautions / Restrictions Precautions Precautions: Back Precaution Booklet Issued: Yes (comment) Precaution Comments: provided handout and education on back precautions. Required Braces or Orthoses: Spinal Brace Spinal Brace: Lumbar corset;Applied in sitting position      Mobility Bed Mobility Overal bed mobility: Needs Assistance;+2 for physical assistance Bed Mobility: Supine to Sit   Sidelying to sit: HOB elevated;Max assist       General bed mobility comments: HOB raised > 50 degrees  Transfers Overall transfer level: Needs assistance Equipment used: Rolling walker (2 wheeled) Transfers: Sit to/from Stand Sit to Stand: +2 physical assistance;Mod assist Stand pivot transfers: +2 physical assistance;Min assist       General transfer comment: decreased assist required once pt attained full upright stance pt with deficits with power up     Balance Overall balance assessment: Needs assistance   Sitting balance-Leahy Scale: Poor                                      ADL Overall ADL's : Needs assistance/impaired Eating/Feeding: Set up Eating/Feeding Details (indicate cue  type and reason): drinking from cup with straw Grooming: Wash/dry face;Oral care;Set up;Sitting                   Toilet Transfer: +2 for physical assistance;Moderate assistance;Stand-pivot;RW;BSC Toilet Transfer Details (indicate cue type and reason): pt unable to void Toileting- Water quality scientist and Hygiene: Total assistance       Functional mobility during ADLs: +2 for physical assistance;Moderate assistance General ADL Comments: Pt required total +2 (A) for transfer due to inability to power up into standing     Vision     Perception     Praxis      Pertinent Vitals/Pain Pain Assessment: 0-10 Pain Score: 5      Hand Dominance Right   Extremity/Trunk Assessment Upper Extremity Assessment Upper Extremity Assessment: Overall WFL for tasks assessed   Lower Extremity Assessment Lower Extremity Assessment: Defer to PT evaluation   Cervical / Trunk Assessment Cervical / Trunk Assessment: Other exceptions (surg) Cervical / Trunk Exceptions: hx of cervical surg- complains of neck pain   Communication Communication Communication: No difficulties   Cognition Arousal/Alertness: Awake/alert Behavior During Therapy: WFL for tasks assessed/performed Overall Cognitive Status: Within Functional Limits for tasks assessed                     General Comments       Exercises       Shoulder Instructions      Home Living Family/patient expects to be discharged to:: Private residence Living Arrangements: Spouse/significant other Available Help at Discharge: Available  24 hours/day;Family Type of Home: House Home Access: Stairs to enter [employer] of Steps: 3 Entrance Stairs-Rails: None Home Layout: Two level;Bed/bath upstairs;1/2 bath on main level Alternate Level Stairs-Number of Steps: 14   Bathroom Shower/Tub: Occupational psychologist: Standard     Home Equipment: None          Prior Functioning/Environment Level of  Independence: Independent        Comments: does not drive    OT Diagnosis: Generalized weakness;Acute pain   OT Problem List: Decreased strength;Decreased activity tolerance;Impaired balance (sitting and/or standing);Decreased safety awareness;Decreased knowledge of use of DME or AE;Decreased knowledge of precautions;Pain   OT Treatment/Interventions: Self-care/ADL training;Therapeutic exercise;DME and/or AE instruction;Therapeutic activities;Patient/family education;Balance training    OT Goals(Current goals can be found in the care plan section) Acute Rehab OT Goals Patient Stated Goal: home OT Goal Formulation: With patient Time For Goal Achievement: 05/29/14 Potential to Achieve Goals: Good  OT Frequency: Min 2X/week   Barriers to D/C:            Co-evaluation PT/OT/SLP Co-Evaluation/Treatment: Yes Reason for Co-Treatment: For patient/therapist safety   OT goals addressed during session: ADL's and self-care      End of Session Equipment Utilized During Treatment: Gait belt;Rolling walker;Back brace Nurse Communication: Mobility status;Precautions  Activity Tolerance: Patient tolerated treatment well Patient left: in chair;with call bell/phone within reach;with family/visitor present   Time: 9449-6759 OT Time Calculation (min): 43 min Charges:  OT General Charges $OT Visit: 1 Procedure OT Evaluation $Initial OT Evaluation Tier I: 1 Procedure OT Treatments $Self Care/Home Management : 23-37 mins G-Codes:    Peri Maris 06/06/14, 2:57 PM Pager: [phone]

## 2014-05-15 NOTE — Progress Notes (Signed)
Pt has low BP of 94/44 and c/o of severe pain in back. Explained to pt that giving pain medication can lower Bp further and to prevent it from getting too low we need to get an IV for fluids. Pt agreed to getting IV. Pain meds given. Continue to monitor BP.

## 2014-05-15 NOTE — Progress Notes (Signed)
Postop day 1. Patient complains of some neck discomfort. No headache. Legs feel better. Back pain well controlled.  Afebrile. Vitals stable. Awake and alert. Oriented and appropriate. Motor and sensory function intact. Wound dressing clean and dry. Abdomen soft.  Status post 2 level lumbar decompression and fusion. Patient overall doing well.

## 2014-05-15 NOTE — Op Note (Signed)
NAMELADYE, Ashley Mercer                 ACCOUNT NO.:  [id number]  MEDICAL RECORD NO.:  [id number]  LOCATION:  [id number]                        FACILITY:  [facility]  PHYSICIAN:  Leeroy Cha, M.D.   DATE OF BIRTH:  12-Mar-1940  DATE OF PROCEDURE:  05/14/2014 DATE OF DISCHARGE:                              OPERATIVE REPORT   PREOPERATIVE DIAGNOSIS:  Lumbar spondylolisthesis and scoliosis with chronic radiculopathy and neurogenic claudication.  Degenerative disk disease.  POSTOPERATIVE DIAGNOSIS:  Lumbar spondylolisthesis and scoliosis with chronic radiculopathy and neurogenic claudication.  Degenerative disk disease.  PROCEDURE:  Bilateral L3-L4 laminectomy, bilateral facetectomy. Bilateral L4-5 diskectomy, right L3-L4 diskectomy.  Decompression of the spinal cord on the L2, 3, 4, and 5 nerve roots.  Cages at the level of 3- 4 and single at the level of 4-5.  Pedicle screws from L2, L3, L4, and L5.  Posterolateral arthrodesis with autograft and BMP.  Cell Saver.  C- arm.  SURGEON:  Leeroy Cha, M.D.  ASSISTANT:  Ashok Pall, M.D.  CLINICAL HISTORY:  The patient had surgery in her neck by me many years ago.  Lately, she had been complaining of back pain in relation to both the legs associated with claudication.  The patient had difficulty walking and coming from the parking lot to my office which is about 200 feet, she had to stop like 3 times.  X-rays showed that she has scoliosis with spondylolisthesis, worse at L4-5 and L3-4.  She has a severe degenerative disk disease at L2-3.  She has also some mild scoliosis thoracic.  The L1-L2 disk space and foramen looked normal as well as L5-S1.  i showed the myelo to dr Cabbell__________ who agreed doing decompression of fusion from L2 down to L4-5.  She and her family knew the risks and benefits of surgery.  PROCEDURE IN DETAIL:  The patient was taken to the OR, and after intubation, she was positioned in a prone manner.  The back was  cleaned with Betadine and DuraPrep.  It was difficult to feel any bony structure and the midline was made.  The incision was carried down upper and lower.  X-ray showed that indeed we were right at the level of 2-3, 3-4, and 4-5.  Muscles were retracted all the way laterally until we were able to see the beginning of the transverse process.  Then with the [place], we removed the spinous process of 2, 3, and 4.  We proceed with laminectomy at the same as those 3 levels.  The patient has quite adhesion with calcification of the dura mater, right at the level of L4- 5 and there was a small opening in the dura, which was taken care with a single stitch.  We entered the disk space bilaterally with a total gross diskectomy.  Because of the scoliosis, we went ahead and introduced 1 single cage at the level of C4-5.  The cage was expandable and the rest of the disk was filled up with BMP and autograft.  During the procedure, we were able to see immediately down the scoliosis improved quite a bit. Then at the level of 3-4, attempt to get into the left side, but it was  difficult.  I went to the right side and a diskectomy was achieved introducing midline expandable cage with BMP and autograft.  At the L2- 3, it was difficult to enter the disk space from either side.  From then on, we investigated the foramen, there was plenty of spaces  __________ for the nerve roots, but also plenty of space for the thecal sac.  Then, using the C-arm first in AP view and then in lateral view, we made holes in the pedicle of the 2, 3, 4, and 5 bilaterally.  Prior to introduction of the pedicle, we feel all the 4 quadrants__________ to be sure that we were surrounded by bone.  7 screws of 5.5 x 40 were inserted and at the level of L2 in the left side, the screw was 6.5.  The screws were connected with 2 rods and secured in place with capps.  A cross-link from right to left was done.  From then on, we went laterally,  we removed the periosteum of the lateral aspect of the facet 2-3, 3-4, and 4-5 on the beginning of the transverse process.  A mix of autograft and BMP was used for arthrodesis.  From then on, the area was irrigated. Valsalva maneuver up to 42 that was negative.  A drain was left in the epidural space.  Vancomycin was left also in the operative site, and the wound was closed with different layer of Vicryl and staples.          ______________________________ Leeroy Cha, M.D.     EB/MEDQ  D:  05/14/2014  T:  05/15/2014  Job:  [id number]

## 2014-05-15 NOTE — Evaluation (Signed)
Physical Therapy Evaluation Patient Details Name: Ashley Mercer MRN: [id number] DOB: 1940-04-02 Today's Date: 05/15/2014   History of Present Illness  Pt underwent ALIF 05/14/14.  Clinical Impression  Patient is s/p above surgery resulting in the deficits listed below (see PT Problem List). Pt required +2 mod assist on eval for bed mobility and gait. Min assist needed for ambulation 10 feet with RW. Patient will benefit from skilled PT to increase their independence and safety with mobility (while adhering to their precautions) to allow discharge home with HHPT and 24-hour assist from family.     Follow Up Recommendations Home health PT;Supervision/Assistance - 24 hour    Equipment Recommendations  Rolling walker with 5" wheels    Recommendations for Other Services       Precautions / Restrictions Precautions Precautions: Back Precaution Booklet Issued: Yes (comment) Precaution Comments: OT provided handout and education on back precautions. Required Braces or Orthoses: Spinal Brace Spinal Brace: Lumbar corset;Applied in sitting position Restrictions Weight Bearing Restrictions: No      Mobility  Bed Mobility Overal bed mobility: Needs Assistance;+2 for physical assistance Bed Mobility: Sidelying to Sit   Sidelying to sit: HOB elevated;Mod assist;+2 for physical assistance       General bed mobility comments: use of bedrail  Transfers Overall transfer level: Needs assistance Equipment used: Rolling walker (2 wheeled) Transfers: Sit to/from [employer] Sit to Stand: +2 physical assistance;Mod assist Stand pivot transfers: +2 physical assistance;Min assist       General transfer comment: decreased assist required once pt attained full upright stance  Ambulation/Gait Ambulation/Gait assistance: Min assist;+2 safety/equipment Ambulation Distance (Feet): 10 Feet Assistive device: Rolling walker (2 wheeled) Gait Pattern/deviations: Step-through  pattern;Decreased stride length;Trunk flexed Gait velocity: decreased      Stairs            Wheelchair Mobility    Modified Rankin (Stroke Patients Only)       Balance                                             Pertinent Vitals/Pain Pain Assessment: 0-10 Pain Score: 5  Pain Location: low back Pain Descriptors / Indicators: Burning Pain Intervention(s): Monitored during session;Repositioned;Premedicated before session    Home Living Family/patient expects to be discharged to:: Private residence Living Arrangements: Spouse/significant other Available Help at Discharge: Available 24 hours/day;Family Type of Home: House Home Access: Stairs to enter Entrance Stairs-Rails: None Entrance Stairs-Number of Steps: 3 Home Layout: Two level;Bed/bath upstairs;1/2 bath on main level Home Equipment: None      Prior Function Level of Independence: Independent         Comments: does not drive     Hand Dominance   Dominant Hand: Right    Extremity/Trunk Assessment   Upper Extremity Assessment: Overall WFL for tasks assessed                     Communication   Communication: No difficulties  Cognition Arousal/Alertness: Awake/alert Behavior During Therapy: WFL for tasks assessed/performed Overall Cognitive Status: Within Functional Limits for tasks assessed                      General Comments      Exercises        Assessment/Plan    PT Assessment Patient needs continued PT services  PT Diagnosis  Difficulty walking;Acute pain;Generalized weakness   PT Problem List Decreased strength;Decreased activity tolerance;Decreased balance;Decreased mobility;Pain;Decreased knowledge of precautions;Decreased safety awareness;Decreased knowledge of use of DME  PT Treatment Interventions DME instruction;Gait training;Stair training;Functional mobility training;Therapeutic activities;Balance training;Patient/family education   PT  Goals (Current goals can be found in the Care Plan section) Acute Rehab PT Goals Patient Stated Goal: home PT Goal Formulation: With patient Time For Goal Achievement: 05/22/14 Potential to Achieve Goals: Good    Frequency Min 5X/week   Barriers to discharge        Co-evaluation PT/OT/SLP Co-Evaluation/Treatment: Yes Reason for Co-Treatment: For patient/therapist safety PT goals addressed during session: Mobility/safety with mobility;Balance         End of Session Equipment Utilized During Treatment: Gait belt;Back brace Activity Tolerance: Patient tolerated treatment well Patient left: in chair;with family/visitor present;with call bell/phone within reach Nurse Communication: Mobility status         Time: 7579-7282 PT Time Calculation (min) (ACUTE ONLY): 22 min   Charges:   PT Evaluation $Initial PT Evaluation Tier I: 1 Procedure     PT G CodesLorriane Shire 05/15/2014, 9:38 AM

## 2014-05-15 NOTE — Progress Notes (Signed)
Orthopedic Tech Progress Note Patient Details:  Ashley Mercer 1940-12-11 [id number]  Patient ID: Ashley Mercer, female   DOB: Jan 31, 1940, 74 y.o.   MRN: [id number] RN stated that pt already has aspen lumbar fusion brace in room.

## 2014-05-15 NOTE — Progress Notes (Addendum)
IV infiltrated, PCA D/C at request of the patient, pt refusing IV restart.  Morphine wasted in sharps container with Sheralyn Boatman and charge RN, Roxan Diesel.

## 2014-05-16 MED ORDER — DIPHENHYDRAMINE HCL 12.5 MG/5ML PO ELIX
25.0000 mg | ORAL_SOLUTION | Freq: Four times a day (QID) | ORAL | Status: DC | PRN
  Administered 2014-05-16 (×2): 25 mg via ORAL
  Filled 2014-05-16 (×2): qty 10

## 2014-05-16 NOTE — Progress Notes (Signed)
Patient ID: Ashley Mercer, female   DOB: July 04, 1940, 74 y.o.   MRN: [id number] BP 105/62 mmHg  Pulse 63  Temp(Src) 97.9 F (36.6 C) (Oral)  Resp 16  Ht 5' 2.5" (1.588 m)  Wt 98.431 kg (217 lb)  BMI 39.03 kg/m2  SpO2 99% Alert and oriented x 4 Speech is clear and fluent Drain is still putting out a lot of fluid. Will leave in place. Moving and walking better. Continue pt, ot

## 2014-05-16 NOTE — Progress Notes (Signed)
Pt still having small rash and redness on mid back. Pt thinks betadine from surgery caused eczema flare up, pt says cream did not help much but benadryl from earlier helped with it. Paged on call MD for another dose of benadryl. Will continue to monitor.

## 2014-05-16 NOTE — Social Work (Signed)
Skilled needs identify Home Health. RNCM consulted. CSW signing off.   Christene Lye MSW, LCSW

## 2014-05-16 NOTE — Progress Notes (Signed)
This writer was called to the patient's room by her daughter stating that there was some drainage from her moms hemovac.  Upon entrance this writer noted that the hemovac was leaking and it was not activated.  Upon further investigation it was noted that the hemovac was partially out of the patient's back.  MD notified, verbal order given to discontinue the hemovac and place a dressing, call if the dressing saturated rapidly.  Will continue to monitor. Cori Razor, RN

## 2014-05-16 NOTE — Progress Notes (Signed)
Physical Therapy Treatment Patient Details Name: Ashley Mercer MRN: [id number] DOB: [birth year] Today's Date: 05/16/2014    History of Present Illness Pt underwent ALIF 05/14/14.    PT Comments    Excellent progress noted today. Pt ambulated 100 feet with RW. Pt will need stair training prior to d/c. Her bedroom and full bathroom are upstairs (14 steps with handrail). Moving a bed downstairs is an option if pt unable to navigate steps.  Follow Up Recommendations  Home health PT;Supervision/Assistance - 24 hour     Equipment Recommendations  Rolling walker with 5" wheels    Recommendations for Other Services       Precautions / Restrictions Precautions Precautions: Back Precaution Comments: Reviewed 3/3 back precautions. Required Braces or Orthoses: Spinal Brace Spinal Brace: Lumbar corset;Applied in sitting position Restrictions Weight Bearing Restrictions: No    Mobility  Bed Mobility   Bed Mobility: Rolling Rolling: Min assist Sidelying to sit: Mod assist       General bed mobility comments: HOB flat with no use of bedrail  Transfers   Equipment used: Rolling walker (2 wheeled)   Sit to Stand: +2 physical assistance;Min assist         General transfer comment: increased time needed to achieve extension bilat knees  Ambulation/Gait Ambulation/Gait assistance: Min assist;+2 safety/equipment Ambulation Distance (Feet): 100 Feet Assistive device: Rolling walker (2 wheeled) Gait Pattern/deviations: Step-through pattern;Decreased stride length Gait velocity: mildly decreased   General Gait Details: verbal cues for posture   Stairs            Wheelchair Mobility    Modified Rankin (Stroke Patients Only)       Balance                                    Cognition Arousal/Alertness: Awake/alert Behavior During Therapy: WFL for tasks assessed/performed Overall Cognitive Status: Within Functional Limits for tasks assessed                       Exercises      General Comments        Pertinent Vitals/Pain Pain Score: 5  Pain Location: low back Pain Intervention(s): Monitored during session;RN gave pain meds during session    Home Living                      Prior Function            PT Goals (current goals can now be found in the care plan section) Progress towards PT goals: Progressing toward goals    Frequency  Min 5X/week    PT Plan Current plan remains appropriate    Co-evaluation             End of Session Equipment Utilized During Treatment: Gait belt;Back brace Activity Tolerance: Patient tolerated treatment well Patient left: in chair;with family/visitor present;with call bell/phone within reach     Time: 1025-1048 PT Time Calculation (min) (ACUTE ONLY): 23 min  Charges:  $Gait Training: 23-37 mins                    G Codes:      Lorriane Shire 05/16/2014, 11:13 AM

## 2014-05-17 MED FILL — Heparin Sodium (Porcine) Inj 1000 Unit/ML: INTRAMUSCULAR | Qty: 30 | Status: AC

## 2014-05-17 MED FILL — Sodium Chloride IV Soln 0.9%: INTRAVENOUS | Qty: 2000 | Status: AC

## 2014-05-17 NOTE — Progress Notes (Signed)
Physical medicine rehabilitation consult requested with chart reviewed. Patient status post lumbar L3-4 laminectomy 05/15/2014 and progressing nicely. Physical and occupational therapy evaluations completed. Patient ambulating min assist rolling walker extended distances. Will not need inpatient rehabilitation services recommendations at this time are for home health therapies. Hold on formal rehabilitation consult at this time with recommendations of discharge to home

## 2014-05-17 NOTE — Progress Notes (Signed)
Occupational Therapy Treatment Patient Details Name: Ashley Mercer MRN: [id number] DOB: 10/02/1940 Today's Date: 05/17/2014    History of present illness Pt s/p ALIF x3 levels 05/14/14.   OT comments  Pt progressing. Education provided in session.  Follow Up Recommendations  Home health OT;Supervision - Intermittent    Equipment Recommendations  3 in 1 bedside comode;Other (comment) (RW)    Recommendations for Other Services      Precautions / Restrictions Precautions Precautions: Back Precaution Booklet Issued: No Precaution Comments: Reviewed 3/3 back precautions. Pt could not recall  Required Braces or Orthoses: Spinal Brace Spinal Brace: Lumbar corset;Applied in sitting position Restrictions Weight Bearing Restrictions: No       Mobility Bed Mobility Overal bed mobility: Needs Assistance Bed Mobility: Rolling;Sidelying to Sit;Sit to Sidelying Rolling: Mod assist;Supervision Sidelying to sit: Mod assist (assist with trunk)     Sit to sidelying: Min assist (assist with LEs) General bed mobility comments: cues for technique.    Transfers Overall transfer level: Needs assistance Equipment used: Rolling walker (2 wheeled) Transfers: Sit to/from Stand Sit to Stand: Mod assist;Min assist         General transfer comment: Mod A for sit to stand from bed and Min assist for sit to stand from [place] guard for ambulation with RW.                                 ADL Overall ADL's : Needs assistance/impaired                 Upper Body Dressing : Sitting;moderate assistance;Standing (family jumped in and assisted-however was on upside down, so pt able to turn it around; assist to tighten when standing and OT also assisted with strap)   Lower Body Dressing: Minimal assistance;With adaptive equipment;Sitting/lateral leans (Min A to don socks-family doffed socks at end of session)   Toilet Transfer: Min guard;Ambulation;RW;BSC;Minimal  assistance (Min guard-ambulation; Min A for sit to stand from [facility])           Functional mobility during ADLs: Min guard;Rolling walker General ADL Comments: Educated on AE and pt practiced with sockaid-unsure if pt would use this or allow family to assist. Educated on back brace. Educated on use of cup for oral care. Pt ambulated to sink and performed oral care and had to sit down on BSC due to leg pain.      Vision                     Perception     Praxis      Cognition  Awake/Alert Behavior During Therapy: WFL for tasks assessed/performed Overall Cognitive Status: Within Functional Limits for tasks assessed       Memory: Decreased recall of precautions;Decreased short-term memory               Extremity/Trunk Assessment               Exercises     Shoulder Instructions       General Comments      Pertinent Vitals/ Pain       Pain Assessment: 0-10 Pain Score:  (6.5) Pain Location: legs and back Pain Descriptors / Indicators: Sore Pain Intervention(s): Limited activity within patient's tolerance;Monitored during session;Repositioned  Home Living  Prior Functioning/Environment              Frequency Min 2X/week     Progress Toward Goals  OT Goals(current goals can now be found in the care plan section)  Progress towards OT goals: Progressing toward goals  Acute Rehab OT Goals Patient Stated Goal: not stated OT Goal Formulation: With patient Time For Goal Achievement: 05/29/14 Potential to Achieve Goals: Good ADL Goals Pt Will Perform Grooming: with min guard assist;standing (3 tasks) Pt Will Perform Upper Body Bathing: with min guard assist;sitting Pt Will Perform Lower Body Bathing: with min guard assist;sit to/from stand Pt Will Transfer to Toilet: with min guard assist;bedside commode;ambulating Pt Will Perform Toileting - Clothing Manipulation and hygiene: with min  guard assist;sit to/from stand Additional ADL Goal #1: Pt will complete bed mobility MOD I as precursor to adls  Plan Discharge plan remains appropriate    Co-evaluation                 End of Session Equipment Utilized During Treatment: Gait belt;Rolling walker;Back brace   Activity Tolerance Patient limited by pain   Patient Left in bed;with call bell/phone within reach;with family/visitor present   Nurse Communication Other (comment) (called nurse to come look at incision per family request)        Time: 3382-5053 OT Time Calculation (min): 21 min  Charges: OT General Charges $OT Visit: 1 Procedure OT Treatments $Self Care/Home Management : 8-22 mins  Benito Mccreedy OTR/L [phone] 05/17/2014, 5:34 PM

## 2014-05-17 NOTE — Anesthesia Postprocedure Evaluation (Signed)
  Anesthesia Post-op Note  Patient: Ashley Mercer  Procedure(s) Performed: Procedure(s): LUMBAR TWO-THREE,LUMBAR THREE-FOUR,LUMBAR FOUR-FIVE POSTERIOR LUMBAR INTERBODY FUSION (N/A)  Patient Location: PACU  Anesthesia Type:General  Level of Consciousness: awake, alert  and oriented  Airway and Oxygen Therapy: Patient Spontanous Breathing and Patient connected to nasal cannula oxygen  Post-op Pain: mild  Post-op Assessment: Post-op Vital signs reviewed, Patient's Cardiovascular Status Stable, Respiratory Function Stable, Patent Airway and Pain level controlled  Post-op Vital Signs: stable  Last Vitals:  Filed Vitals:   05/17/14 2219  BP: 125/62  Pulse: 69  Temp: 36.9 C  Resp: 18    Complications: No apparent anesthesia complications

## 2014-05-17 NOTE — Progress Notes (Signed)
Patient ID: Ashley Mercer, female   DOB: 08-10-1940, 74 y.o.   MRN: [id number] Complaining of wound pain, no weakness. Denied admission to rehabilitation. Continue pt/ot

## 2014-05-17 NOTE — Progress Notes (Signed)
Physical Therapy Treatment Patient Details Name: Ashley Mercer MRN: [id number] DOB: [birth year] Today's Date: 05/17/2014    History of Present Illness Pt s/p ALIF x3 levels 05/14/14.    PT Comments    Pt with functional mobility decline this date due to increased pain at surgical site/low back. Pt with limited ambulation tolerance this date to 40 feet, significantly less than yesterday. Attempted stair negotiation this date however pt required maxA to complete 2 steps with HR and 1 person assist and she has 14 steps to access bed/bath and 2 STE home without HR. Daughter present for training. Pt to benefit from additional night stay to address pain management to allow for progression of functional mobility to supervision level.   Follow Up Recommendations  Home health PT;Supervision/Assistance - 24 hour     Equipment Recommendations  3in1 (PT);Rolling walker with 5" wheels    Recommendations for Other Services       Precautions / Restrictions Precautions Precautions: Back Precaution Booklet Issued: Yes (comment) Precaution Comments: Reviewed 3/3 back precautions. Required Braces or Orthoses: Spinal Brace Spinal Brace: Lumbar corset;Applied in sitting position (v/c's for donning as patient had it inside out) Restrictions Weight Bearing Restrictions: No    Mobility  Bed Mobility Overal bed mobility: Needs Assistance Bed Mobility: Rolling Rolling: Min assist Sidelying to sit: Min assist;HOB elevated       General bed mobility comments: HOB elevated and definite use of bed rail, v/c's for logroll to minimize twisting  Transfers Overall transfer level: Needs assistance Equipment used: Rolling walker (2 wheeled) Transfers: Sit to/from Stand Sit to Stand: Min assist         General transfer comment: significant increase in time due to increased pain. completed 3 sit to stand and all trails required multiple attempts. pt deferred physical assist due to wanted to complete  independently  Ambulation/Gait Ambulation/Gait assistance: Min assist Ambulation Distance (Feet): 40 Feet Assistive device: Rolling walker (2 wheeled) Gait Pattern/deviations: Step-through pattern;Decreased stride length;Antalgic;Wide base of support Gait velocity: decreased   General Gait Details: v/c's to stay in walker and minimize trunk flexion. Pt with noted LE weakness/buckling due to back pain   Stairs Stairs: Yes Stairs assistance: Max assist Stair Management: One rail Left;Forwards (with HHA on R) Number of Stairs: 2 General stair comments: extremely labored and painful requiring maxA from PT for safety and to brace pt. did not attempt platform step as pt in increased pain  Wheelchair Mobility    Modified Rankin (Stroke Patients Only)       Balance Overall balance assessment: Needs assistance         Standing balance support: Bilateral upper extremity supported Standing balance-Leahy Scale: Poor                      Cognition Arousal/Alertness: Awake/alert Behavior During Therapy: WFL for tasks assessed/performed Overall Cognitive Status: Within Functional Limits for tasks assessed                      Exercises      General Comments        Pertinent Vitals/Pain Pain Assessment: 0-10 Pain Score: 6  (increased to 9/10 during PT session) Pain Location: low Pain Descriptors / Indicators: Sore Pain Intervention(s): Patient requesting pain meds-RN notified;Monitored during session    Home Living                      Prior Function  PT Goals (current goals can now be found in the care plan section) Progress towards PT goals: Progressing toward goals    Frequency  Min 5X/week    PT Plan Current plan remains appropriate    Co-evaluation             End of Session Equipment Utilized During Treatment: Gait belt;Back brace Activity Tolerance: Patient limited by pain Patient left: in chair;with call  bell/phone within reach;with family/visitor present;with nursing/sitter in room     Time: 0757-0832 PT Time Calculation (min) (ACUTE ONLY): 35 min  Charges:  $Gait Training: 23-37 mins                    G Codes:      Ashley Mercer 05/17/2014, 8:48 AM

## 2014-05-18 MED ORDER — FLEET ENEMA 7-19 GM/118ML RE ENEM
1.0000 | ENEMA | Freq: Every day | RECTAL | Status: DC | PRN
  Administered 2014-05-18: 1 via RECTAL
  Filled 2014-05-18: qty 1

## 2014-05-18 MED ORDER — DOCUSATE SODIUM 100 MG PO CAPS
100.0000 mg | ORAL_CAPSULE | Freq: Two times a day (BID) | ORAL | Status: DC
  Administered 2014-05-18 – 2014-05-20 (×5): 100 mg via ORAL
  Filled 2014-05-18 (×5): qty 1

## 2014-05-18 NOTE — Progress Notes (Signed)
Patient ID: Ashley Mercer, female   DOB: [birth year], 74 y.o.   MRN: [id number] C/o incisional pain. Bladder working. Constipated. No weakness.

## 2014-05-18 NOTE — Progress Notes (Signed)
Physical Therapy Treatment Patient Details Name: Ashley Mercer MRN: [id number] DOB: 1940-12-08 Today's Date: 05/18/2014    History of Present Illness Pt s/p ALIF x3 levels 05/14/14.    PT Comments    Pt with slow progression of mobility due to weakness, pain, and decreased activity tolerance/fatigues quickly. Discussed with family about renting hospital bed to eliminate having to negotiate 14 steps upstairs as patient unsafe at this time to do so. Pt and family agreed. Pt however will have to do 2 platform steps to enter home.   Follow Up Recommendations  Home health PT;Supervision/Assistance - 24 hour     Equipment Recommendations  Rolling walker with 5" wheels;3in1 (PT);Hospital bed (needs youth RW) ,    Recommendations for Other Services       Precautions / Restrictions Precautions Precautions: Back Precaution Booklet Issued: Yes (comment) Precaution Comments: Reviewed 3/3 back precautions. Required Braces or Orthoses: Spinal Brace Spinal Brace: Lumbar corset;Applied in sitting position Restrictions Weight Bearing Restrictions: No    Mobility  Bed Mobility Overal bed mobility: Needs Assistance Bed Mobility: Sit to Sidelying         Sit to sidelying: Max assist General bed mobility comments: assist for LE management back in to bed. max v/c's to adhere to precautions and not twist.   Transfers Overall transfer level: Needs assistance Equipment used: Rolling walker (2 wheeled) Transfers: Sit to/from Stand Sit to Stand: Min assist         General transfer comment: significant increase in time, labored effort, multiple attempts. pt with significant LE weakness rqeuiring mina to stabilize during transfer of hands from chair to RW  Ambulation/Gait Ambulation/Gait assistance: Min assist Ambulation Distance (Feet): 20 Feet (x2) Assistive device: Rolling walker (2 wheeled) Gait Pattern/deviations: Step-through pattern Gait velocity: decreased Gait velocity  interpretation: <1.8 ft/sec, indicative of risk for recurrent falls General Gait Details: v/c's to stay in walker and minimize trunk flexion. Pt with noted LE weakness/buckling due to back pain. pt with frequent stops due to fatigue and pain   Stairs            Wheelchair Mobility    Modified Rankin (Stroke Patients Only)       Balance Overall balance assessment: Needs assistance         Standing balance support: Bilateral upper extremity supported Standing balance-Leahy Scale: Poor                      Cognition Arousal/Alertness: Awake/alert Behavior During Therapy: WFL for tasks assessed/performed Overall Cognitive Status: Within Functional Limits for tasks assessed                      Exercises      General Comments        Pertinent Vitals/Pain Pain Assessment: 0-10 Pain Score: 6  Pain Location: L LE and back Pain Descriptors / Indicators:  (pulling) Pain Intervention(s): Limited activity within patient's tolerance    Home Living                      Prior Function            PT Goals (current goals can now be found in the care plan section) Progress towards PT goals: Progressing toward goals    Frequency  Min 5X/week    PT Plan Current plan remains appropriate    Co-evaluation             End of Session  Equipment Utilized During Treatment: Gait belt;Back brace Activity Tolerance: Patient limited by pain Patient left: in bed;with bed alarm set;with family/visitor present (in sidelying with pillow between knees)     Time: 1011-1030 PT Time Calculation (min) (ACUTE ONLY): 19 min  Charges:  $Gait Training: 8-22 mins                    G Codes:      [name] 05/18/2014, 10:43 AM  Kittie Plater, PT, DPT Pager #: [phone] Office #: [phone]

## 2014-05-19 MED ORDER — POLYETHYLENE GLYCOL 3350 17 G PO PACK
17.0000 g | PACK | Freq: Every day | ORAL | Status: DC | PRN
  Administered 2014-05-19 – 2014-05-20 (×2): 17 g via ORAL
  Filled 2014-05-19: qty 1

## 2014-05-19 NOTE — Progress Notes (Signed)
Patient ID: Ashley Mercer, female   DOB: 04/08/1940, 74 y.o.   MRN: [id number] C/o incisional pain.declined by rehabilitation. Dc in am?

## 2014-05-19 NOTE — Consult Note (Signed)
Physical Medicine and Rehabilitation Consult Reason for Consult: Lumbar stenosis with radiculopathy Referring Physician: Dr. Joya Salm   HPI: Ashley Mercer is a 74 y.o. right handed female admitted 05/15/2014 with low back pain radiating to the lower extremities. Independent prior to admission living with her husband. X-rays and imaging revealed lumbar spondylolisthesis stenosis with chronic radiculopathy as well as neurogenic claudication. Underwent bilateral L3-L4 laminectomy, discectomy with decompression of spinal cord and cages at the level of 3 and 4 as well as pedicle screws 05/15/2014 per Dr. Joya Salm. Hospital course pain management. Back brace applied when out of bed and sitting position. Patient had been progressing nicely with mobility however exhibiting much difficulty with navigating stairs. Recommendations have been made for physical medicine rehabilitation consult.   Review of Systems  Gastrointestinal: Positive for constipation.  Musculoskeletal: Positive for myalgias and back pain.  All other systems reviewed and are negative.  Past Medical History  Diagnosis Date  . Hypertension   . Thyroid condition   . Chronic back pain   . Cervical spinal stenosis   . Hypothyroidism   . Frequent UTI   . Dry skin   . Eczema   . History of blood in urine     Pt was seen by [facility]; scant amount; no current issues  . Fatty tumor     Right wrist  . Numbness and tingling in hands     Bilateral   Past Surgical History  Procedure Laterality Date  . Thyroidectomy  1971  . Total abdominal hysterectomy  1986  . Cervical spine surgery  01/28/2009  . Breast biopsy Right 2006    benign  . Colonoscopy w/ polypectomy    . Tonsillectomy    . Appendectomy     Family History  Problem Relation Age of Onset  . Heart disease Mother   . Kidney failure Father    Social History:  reports that she has quit smoking. She has never used smokeless tobacco. She reports that she  drinks alcohol. She reports that she does not use illicit drugs. Allergies: No Known Allergies Medications Prior to Admission  Medication Sig Dispense Refill  . aspirin EC 81 MG tablet Take 81 mg by mouth daily.    . colesevelam (WELCHOL) 625 MG tablet Take 1,250 mg by mouth 2 (two) times daily with a meal.    . docusate sodium (COLACE) 100 MG capsule Take 100 mg by mouth 2 (two) times daily.    . DULoxetine (CYMBALTA) 60 MG capsule Take 60 mg by mouth at bedtime.     Marland Kitchen gemfibrozil (LOPID) 600 MG tablet Take 600 mg by mouth daily.    Marland Kitchen HYDROcodone-acetaminophen (NORCO/VICODIN) 5-325 MG per tablet Take 1 tablet by mouth at bedtime.    Marland Kitchen ibuprofen (ADVIL,MOTRIN) 800 MG tablet Take 800 mg by mouth 2 (two) times daily as needed (back pain).     Marland Kitchen levothyroxine (SYNTHROID, LEVOTHROID) 100 MCG tablet Take 100 mcg by mouth daily before breakfast.    . metoprolol succinate (TOPROL-XL) 100 MG 24 hr tablet Take 100 mg by mouth daily.   0  . Multiple Vitamins-Minerals (MULTIVITAMIN PO) Take 1 tablet by mouth daily.     . quinapril-hydrochlorothiazide (ACCURETIC) 20-25 MG per tablet Take 1 tablet by mouth daily.    . raloxifene (EVISTA) 60 MG tablet Take 60 mg by mouth daily.    . ranitidine (ZANTAC) 300 MG capsule Take 300 mg by mouth 2 (two) times daily.    Marland Kitchen  traMADol (ULTRAM) 50 MG tablet Take 50 mg by mouth every 6 (six) hours as needed. Takes two tablets if needed for pain    . triamcinolone cream (KENALOG) 0.5 % Apply 1 application topically daily as needed (dry skin).   0    Home: Home Living Family/patient expects to be discharged to:: Private residence Living Arrangements: Spouse/significant other Available Help at Discharge: Available 24 hours/day, Family Type of Home: House Home Access: Stairs to enter Technical brewer of Steps: 3 Entrance Stairs-Rails: None Home Layout: Two level, Bed/bath upstairs, 1/2 bath on main level Alternate Level Stairs-Number of Steps: 14 Home  Equipment: None  Functional History: Prior Function Level of Independence: Independent Comments: does not drive Functional Status:  Mobility: Bed Mobility Overal bed mobility: Needs Assistance Bed Mobility: Sit to Sidelying Rolling: Mod assist, Supervision Sidelying to sit: Mod assist (assist with trunk) Sit to sidelying: Max assist General bed mobility comments: assist for LE management back in to bed. max v/c's to adhere to precautions and not twist.  Transfers Overall transfer level: Needs assistance Equipment used: Rolling walker (2 wheeled) Transfers: Sit to/from Stand Sit to Stand: Min assist Stand pivot transfers: +2 physical assistance, Min assist General transfer comment: significant increase in time, labored effort, multiple attempts. pt with significant LE weakness rqeuiring mina to stabilize during transfer of hands from chair to RW Ambulation/Gait Ambulation/Gait assistance: Min assist Ambulation Distance (Feet): 20 Feet (x2) Assistive device: Rolling walker (2 wheeled) General Gait Details: v/c's to stay in walker and minimize trunk flexion. Pt with noted LE weakness/buckling due to back pain. pt with frequent stops due to fatigue and pain Gait Pattern/deviations: Step-through pattern Gait velocity: decreased Gait velocity interpretation: <1.8 ft/sec, indicative of risk for recurrent falls Stairs: Yes Stairs assistance: Max assist Stair Management: One rail Left, Forwards (with HHA on R) Number of Stairs: 2 General stair comments: extremely labored and painful requiring maxA from PT for safety and to brace pt. did not attempt platform step as pt in increased pain    ADL: ADL Overall ADL's : Needs assistance/impaired Eating/Feeding: Set up Eating/Feeding Details (indicate cue type and reason): drinking from cup with straw Grooming: Wash/dry face, Oral care, Set up, Sitting Upper Body Dressing : Sitting, Minimal assistance, Standing (family jumped in and  assisted-however was on upside down) Lower Body Dressing: Minimal assistance, With adaptive equipment, Sitting/lateral leans (Min A to don socks-family doffed socks at end of session) Toilet Transfer: Min guard, Ambulation, RW, BSC, Minimal assistance (Min guard-ambulation; Min A for sit to stand from [facility]) Armed forces technical officer Details (indicate cue type and reason): pt unable to void Toileting- Water quality scientist and Hygiene: Total assistance Functional mobility during ADLs: Min guard, Rolling walker General ADL Comments: Educated on AE and pt practiced with sockaid-unsure if pt would use this or allow family to assist. Educated on back brace. Educated on use of cup for oral care. Pt ambulated to sink and performed oral care and had to sit down on BSC due to leg pain.  Cognition: Cognition Overall Cognitive Status: Within Functional Limits for tasks assessed Orientation Level: Oriented X4 Cognition Arousal/Alertness: Awake/alert Behavior During Therapy: WFL for tasks assessed/performed Overall Cognitive Status: Within Functional Limits for tasks assessed Memory: Decreased recall of precautions, Decreased short-term memory  Blood pressure 138/59, pulse 70, temperature 98.1 F (36.7 C), temperature source Oral, resp. rate 18, height 5' 2.5" (1.588 m), weight 98.431 kg (217 lb), SpO2 97 %. Physical Exam  Vitals reviewed. Constitutional: She is oriented to person, place,  and time. She appears well-developed.  HENT:  Head: Normocephalic.  Eyes: EOM are normal.  Neck: Normal range of motion. Neck supple. No thyromegaly present.  Cardiovascular: Normal rate and regular rhythm.   Respiratory: Effort normal and breath sounds normal. No respiratory distress.  GI: Soft. Bowel sounds are normal. She exhibits no distension.  Neurological: She is alert and oriented to person, place, and time.  Skin:  Back incision clean and dry    No results found for this or any previous visit (from the past 24  hour(s)). No results found.  Assessment/Plan: Diagnosis: lumbar neurogenic claudication  RECOMMENDATIONS: This patient's condition is appropriate for continued rehabilitative care in the following setting: [facility] Patient has agreed to participate in recommended program. Potentially Note that insurance prior authorization may be required for reimbursement for recommended care.  Comment: Pt too high level for CIR. Lacks medical necessity also  Meredith Staggers, MD, [place] [facility] 05/20/2014     05/19/2014

## 2014-05-19 NOTE — Progress Notes (Signed)
Occupational Therapy Treatment Patient Details Name: Ashley Mercer MRN: [id number] DOB: Sep 01, 1940 Today's Date: 05/19/2014    History of present illness Pt s/p ALIF x3 levels 05/14/14.   OT comments  Pt progressing towards acute OT goals. Pt completed LB dressing with mod A and in-room ambulation at a close min guard level. Pt and daughter inquiring about CIR due to concerns with d/c home due to inaccessible home environment. Pt reports she cannot fit a rw into her bathroom and has a flight of stairs to access bedroom. Updating recommendation to CIR. Pt motivated and would be a good candidate for CIR prior to d/c home. OT to continue to follow acutely.   Follow Up Recommendations  CIR    Equipment Recommendations  Other (comment) (TBD )    Recommendations for Other Services Rehab consult    Precautions / Restrictions Precautions Precautions: Back Precaution Booklet Issued: Yes (comment) Precaution Comments: Reviewed 3/3 back precautions. Required Braces or Orthoses: Spinal Brace Spinal Brace: Lumbar corset;Applied in sitting position Restrictions Weight Bearing Restrictions: No       Mobility Bed Mobility               General bed mobility comments: in recliner  Transfers Overall transfer level: Needs assistance Equipment used: Rolling walker (2 wheeled) Transfers: Sit to/from Stand Sit to Stand: Mod assist         General transfer comment: mod A to control descent into recliner and for balance during power up.    Balance Overall balance assessment: Needs assistance         Standing balance support: Bilateral upper extremity supported;During functional activity Standing balance-Leahy Scale: Poor Standing balance comment: unsteady with single extremity support; needs rw for blanace                   ADL Overall ADL's : Needs assistance/impaired                     Lower Body Dressing: With adaptive equipment;Moderate assistance;Sit  to/from stand Lower Body Dressing Details (indicate cue type and reason): AE to don LB dressing onto legs, mod A to complete dresing task with pt in static stand with both hands on rw. Decreased balance when attempted standing with 1 hand on rw.             Functional mobility during ADLs: Min guard;Rolling walker General ADL Comments: Pt completed LB dressing as detailed above. Pt needed mod A to complete sit<>stand transfer and to complete donning of LB dressing. Pt completed in-room ambulation at close min guard level.      Vision                     Perception     Praxis      Cognition   Behavior During Therapy: WFL for tasks assessed/performed Overall Cognitive Status: Within Functional Limits for tasks assessed       Memory: Decreased recall of precautions;Decreased short-term memory               Extremity/Trunk Assessment               Exercises     Shoulder Instructions       General Comments      Pertinent Vitals/ Pain       Pain Assessment: Faces Faces Pain Scale: Hurts little more Pain Location: BLE Pain Descriptors / Indicators: Constant Pain Intervention(s): Monitored during session;Limited activity within patient's tolerance;Repositioned  Home Living                                          Prior Functioning/Environment              Frequency Min 2X/week     Progress Toward Goals  OT Goals(current goals can now be found in the care plan section)  Progress towards OT goals: Progressing toward goals  Acute Rehab OT Goals Patient Stated Goal: not stated OT Goal Formulation: With patient Time For Goal Achievement: 05/29/14 Potential to Achieve Goals: Good ADL Goals Pt Will Perform Grooming: with min guard assist;standing Pt Will Perform Upper Body Bathing: with min guard assist;sitting Pt Will Perform Lower Body Bathing: with min guard assist;sit to/from stand Pt Will Transfer to Toilet: with min  guard assist;bedside commode;ambulating Pt Will Perform Toileting - Clothing Manipulation and hygiene: with min guard assist;sit to/from stand Additional ADL Goal #1: Pt will complete bed mobility MOD I as precursor to adls  Plan Discharge plan needs to be updated    Co-evaluation                 End of Session Equipment Utilized During Treatment: Gait belt;Rolling walker;Back brace   Activity Tolerance Patient tolerated treatment well   Patient Left in chair;with call bell/phone within reach   Nurse Communication          Time: 4497-5300 OT Time Calculation (min): 22 min  Charges: OT General Charges $OT Visit: 1 Procedure OT Treatments $Self Care/Home Management : 8-22 mins  Hortencia Pilar 05/19/2014, 11:37 AM

## 2014-05-19 NOTE — Progress Notes (Signed)
Physical Therapy Treatment Patient Details Name: Ashley Mercer MRN: [id number] DOB: 10/14/1940 Today's Date: 05/19/2014    History of Present Illness Pt s/p ALIF x3 levels 05/14/14.    PT Comments    Good progress towards physical therapy goals. Ambulating up to 95 feet with min guard assist while using a rolling walker for support. Reviewed back precautions and completed stair training with daughter (3 steps to enter house.) Daughter is trying to arrange for a hospital bed to be delivered so pt will not have full flight of stairs to get to her bedroom at d/c. Continues to demonstrate increased pain with mobility. Patient will continue to benefit from skilled physical therapy services to further improve independence with functional mobility.   Follow Up Recommendations  Home health PT;Supervision/Assistance - 24 hour     Equipment Recommendations  Rolling walker with 5" wheels;3in1 (PT);Hospital bed (needs youth RW)    Recommendations for Other Services       Precautions / Restrictions Precautions Precautions: Back Precaution Comments: Reviewed 3/3 back precautions. Required Braces or Orthoses: Spinal Brace Spinal Brace: Lumbar corset;Applied in sitting position Restrictions Weight Bearing Restrictions: No    Mobility  Bed Mobility Overal bed mobility: Needs Assistance Bed Mobility: Sidelying to Sit   Sidelying to sit: Supervision       General bed mobility comments: supervision for safety. Cues to maintain back precautions. no use of rail.  Transfers Overall transfer level: Needs assistance Equipment used: Rolling walker (2 wheeled) Transfers: Sit to/from Stand Sit to Stand: Min assist         General transfer comment: Min assist for boost to stand from bed, recliner and table mat. Cues for posture. each attempt became increasingly difficult for pt.  Ambulation/Gait Ambulation/Gait assistance: Min guard Ambulation Distance (Feet): 95 Feet Assistive device:  Rolling walker (2 wheeled) Gait Pattern/deviations: Step-through pattern;Decreased stride length;Trunk flexed Gait velocity: decreased   General Gait Details: Frequent VC for upright posture and walker placement within base of support for proximity. No buckling noted. Pts posture decreases with fatigue. Took several standing rest breaks to complete distance.   Stairs Stairs: Yes Stairs assistance: Min assist Stair Management: No rails;Step to pattern;Backwards;With walker Number of Stairs: 3 General stair comments: Educated patient and daughter on safe stair navigation techniques. daughter actively participated in training and provided min assist to block walker. Very difficult for pt to perform with increased RLE pain however did complete without loss of balance today.  Wheelchair Mobility    Modified Rankin (Stroke Patients Only)       Balance                                    Cognition Arousal/Alertness: Awake/alert Behavior During Therapy: WFL for tasks assessed/performed Overall Cognitive Status: Within Functional Limits for tasks assessed       Memory: Decreased recall of precautions              Exercises      General Comments        Pertinent Vitals/Pain Pain Assessment: Faces Faces Pain Scale: Hurts even more Pain Location: back RLE Pain Descriptors / Indicators: Constant Pain Intervention(s): Monitored during session    Home Living                      Prior Function            PT Goals (current  goals can now be found in the care plan section) Acute Rehab PT Goals PT Goal Formulation: With patient Progress towards PT goals: Progressing toward goals    Frequency  Min 5X/week    PT Plan Current plan remains appropriate    Co-evaluation             End of Session Equipment Utilized During Treatment: Gait belt;Back brace Activity Tolerance: Patient limited by pain Patient left: in chair;with call bell/phone  within reach;with family/visitor present     Time: 1886-7737 PT Time Calculation (min) (ACUTE ONLY): 23 min  Charges:  $Gait Training: 8-22 mins $Therapeutic Activity: 8-22 mins                    G Codes:      Ashley Mercer 2014-05-21, 6:28 PM Ashley Mercer, Ashley Mercer

## 2014-05-20 NOTE — Progress Notes (Signed)
PT Cancellation Note  Patient Details Name: Ashley Mercer MRN: [id number] DOB: [birth year]   Cancelled Treatment:    Reason Eval/Treat Not Completed: Patient declined, no reason specified Politely declines PT services today. States she is ready for d/c and leaving soon. Family with no further questions or concerns. Explained for pt to call if she has any further questions regarding mobility.  Ellouise Newer 05/20/2014, 11:32 AM Elayne Snare, [place]

## 2014-05-20 NOTE — Discharge Summary (Signed)
Physician Discharge Summary  Patient ID: Ashley Mercer MRN: [id number] DOB/AGE: 1940/08/29 74 y.o.  Admit date: 05/14/2014 Discharge date: 05/20/2014  Admission Diagnoses:lumbar spondylolisthesis  Discharge Diagnoses:  Active Problems:   Spondylolisthesis of lumbar region   Discharged Condition:incisional pain, no weakness  Hospital Course: surgery  Consults:rehab medicine  Significant Diagnostic Studies: myelogram   Treatments: lumbar fusion  Discharge Exam: Blood pressure 145/71, pulse 64, temperature 97.8 F (36.6 C), temperature source Oral, resp. rate 20, height 5' 2.5" (1.588 m), weight 98.431 kg (217 lb), SpO2 94 %. No weakness.   Disposition: home with pt     Medication List    ASK your doctor about these medications        aspirin EC 81 MG tablet  Take 81 mg by mouth daily.     colesevelam 625 MG tablet  Commonly known as:  WELCHOL  Take 1,250 mg by mouth 2 (two) times daily with a meal.     docusate sodium 100 MG capsule  Commonly known as:  COLACE  Take 100 mg by mouth 2 (two) times daily.     DULoxetine 60 MG capsule  Commonly known as:  CYMBALTA  Take 60 mg by mouth at bedtime.     HYDROcodone-acetaminophen 5-325 MG per tablet  Commonly known as:  NORCO/VICODIN  Take 1 tablet by mouth at bedtime.     ibuprofen 800 MG tablet  Commonly known as:  ADVIL,MOTRIN  Take 800 mg by mouth 2 (two) times daily as needed (back pain).     levothyroxine 100 MCG tablet  Commonly known as:  SYNTHROID, LEVOTHROID  Take 100 mcg by mouth daily before breakfast.     LOPID 600 MG tablet  Generic drug:  gemfibrozil  Take 600 mg by mouth daily.     metoprolol succinate 100 MG 24 hr tablet  Commonly known as:  TOPROL-XL  Take 100 mg by mouth daily.     MULTIVITAMIN PO  Take 1 tablet by mouth daily.     quinapril-hydrochlorothiazide 20-25 MG per tablet  Commonly known as:  ACCURETIC  Take 1 tablet by mouth daily.     raloxifene 60 MG tablet  Commonly  known as:  EVISTA  Take 60 mg by mouth daily.     ranitidine 300 MG capsule  Commonly known as:  ZANTAC  Take 300 mg by mouth 2 (two) times daily.     traMADol 50 MG tablet  Commonly known as:  ULTRAM  Take 50 mg by mouth every 6 (six) hours as needed. Takes two tablets if needed for pain     triamcinolone cream 0.5 %  Commonly known as:  KENALOG  Apply 1 application topically daily as needed (dry skin).         Signed: Floyce Stakes 05/20/2014, 9:27 AM

## 2014-05-20 NOTE — Progress Notes (Signed)
Pt discharging with daughter Maudie Mercury home with all personal belongings. Home health  has already called to let pt know when they will be coming to her home. She is aware to schedule a follow up appt in 2 weeks. Discharge instructions provided with verbal understanding. MD has already given pt prescriptions. No noted distress.

## 2014-05-20 NOTE — Care Management Note (Signed)
Case Management Note  Patient Details  Name: Ashley Mercer MRN: [id number] Date of Birth: [birth year]  Subjective/Objective:     ADMITTED FOR SURGERY               Action/Plan: Talked to patient with daughter present about [facility] choices, patient chose [facility]; Miranda with [facility] called for arrangements; RW and 3:1 ordered also. Verdene Lennert ( daughter) also requested a hospital bed but the patient does not want this at this time; Lyda Jester [phone]  Expected Discharge Date:  05/20/2014             Expected Discharge Plan:  [place]  In-House Referral:  NA  Discharge planning Services  CM Consult  Post Acute Care Choice:  Home Health Choice offered to:     DME Arranged:  Walker rolling, 3-N-1 DME Agency:  [place]:  PT, OT [facility] Agency:  [facility]  Status of Service:  Completed, signed off  Medicare Important Message Given:  Yes Date Medicare IM Given:  05/19/14 Medicare IM give by:  Mindi Slicker RN Date Additional Medicare IM Given:    Additional Medicare Important Message give by:      Royston Bake, Joselyn Arrow [phone] 05/20/2014, 10:00 AM

## 2014-05-21 DIAGNOSIS — M419 Scoliosis, unspecified: Secondary | ICD-10-CM | POA: Diagnosis not present

## 2014-05-21 DIAGNOSIS — M5416 Radiculopathy, lumbar region: Secondary | ICD-10-CM | POA: Diagnosis not present

## 2014-05-21 DIAGNOSIS — M5116 Intervertebral disc disorders with radiculopathy, lumbar region: Secondary | ICD-10-CM | POA: Diagnosis not present

## 2014-05-21 DIAGNOSIS — Z4789 Encounter for other orthopedic aftercare: Secondary | ICD-10-CM | POA: Diagnosis not present

## 2014-05-21 DIAGNOSIS — M4316 Spondylolisthesis, lumbar region: Secondary | ICD-10-CM | POA: Diagnosis not present

## 2014-05-24 DIAGNOSIS — M419 Scoliosis, unspecified: Secondary | ICD-10-CM | POA: Diagnosis not present

## 2014-05-24 DIAGNOSIS — M5116 Intervertebral disc disorders with radiculopathy, lumbar region: Secondary | ICD-10-CM | POA: Diagnosis not present

## 2014-05-24 DIAGNOSIS — M5416 Radiculopathy, lumbar region: Secondary | ICD-10-CM | POA: Diagnosis not present

## 2014-05-24 DIAGNOSIS — M4316 Spondylolisthesis, lumbar region: Secondary | ICD-10-CM | POA: Diagnosis not present

## 2014-05-24 DIAGNOSIS — Z4789 Encounter for other orthopedic aftercare: Secondary | ICD-10-CM | POA: Diagnosis not present

## 2014-05-25 DIAGNOSIS — M4316 Spondylolisthesis, lumbar region: Secondary | ICD-10-CM | POA: Diagnosis not present

## 2014-05-25 DIAGNOSIS — Z4789 Encounter for other orthopedic aftercare: Secondary | ICD-10-CM | POA: Diagnosis not present

## 2014-05-25 DIAGNOSIS — M419 Scoliosis, unspecified: Secondary | ICD-10-CM | POA: Diagnosis not present

## 2014-05-25 DIAGNOSIS — M5416 Radiculopathy, lumbar region: Secondary | ICD-10-CM | POA: Diagnosis not present

## 2014-05-25 DIAGNOSIS — M5116 Intervertebral disc disorders with radiculopathy, lumbar region: Secondary | ICD-10-CM | POA: Diagnosis not present

## 2014-05-25 NOTE — Addendum Note (Signed)
Addendum  created 05/25/14 2334 by Roberts Gaudy, MD   Modules edited: Anesthesia Attestations

## 2014-05-26 DIAGNOSIS — Z4789 Encounter for other orthopedic aftercare: Secondary | ICD-10-CM | POA: Diagnosis not present

## 2014-05-26 DIAGNOSIS — M419 Scoliosis, unspecified: Secondary | ICD-10-CM | POA: Diagnosis not present

## 2014-05-26 DIAGNOSIS — M4316 Spondylolisthesis, lumbar region: Secondary | ICD-10-CM | POA: Diagnosis not present

## 2014-05-26 DIAGNOSIS — M5116 Intervertebral disc disorders with radiculopathy, lumbar region: Secondary | ICD-10-CM | POA: Diagnosis not present

## 2014-05-26 DIAGNOSIS — M5416 Radiculopathy, lumbar region: Secondary | ICD-10-CM | POA: Diagnosis not present

## 2014-05-27 DIAGNOSIS — Z4789 Encounter for other orthopedic aftercare: Secondary | ICD-10-CM | POA: Diagnosis not present

## 2014-05-27 DIAGNOSIS — M5416 Radiculopathy, lumbar region: Secondary | ICD-10-CM | POA: Diagnosis not present

## 2014-05-27 DIAGNOSIS — M4316 Spondylolisthesis, lumbar region: Secondary | ICD-10-CM | POA: Diagnosis not present

## 2014-05-27 DIAGNOSIS — M419 Scoliosis, unspecified: Secondary | ICD-10-CM | POA: Diagnosis not present

## 2014-05-27 DIAGNOSIS — M5116 Intervertebral disc disorders with radiculopathy, lumbar region: Secondary | ICD-10-CM | POA: Diagnosis not present

## 2014-05-28 DIAGNOSIS — M5416 Radiculopathy, lumbar region: Secondary | ICD-10-CM | POA: Diagnosis not present

## 2014-05-28 DIAGNOSIS — M5116 Intervertebral disc disorders with radiculopathy, lumbar region: Secondary | ICD-10-CM | POA: Diagnosis not present

## 2014-05-28 DIAGNOSIS — M4316 Spondylolisthesis, lumbar region: Secondary | ICD-10-CM | POA: Diagnosis not present

## 2014-05-28 DIAGNOSIS — M419 Scoliosis, unspecified: Secondary | ICD-10-CM | POA: Diagnosis not present

## 2014-05-28 DIAGNOSIS — Z4789 Encounter for other orthopedic aftercare: Secondary | ICD-10-CM | POA: Diagnosis not present

## 2014-05-31 DIAGNOSIS — M419 Scoliosis, unspecified: Secondary | ICD-10-CM | POA: Diagnosis not present

## 2014-05-31 DIAGNOSIS — M5416 Radiculopathy, lumbar region: Secondary | ICD-10-CM | POA: Diagnosis not present

## 2014-05-31 DIAGNOSIS — M4316 Spondylolisthesis, lumbar region: Secondary | ICD-10-CM | POA: Diagnosis not present

## 2014-05-31 DIAGNOSIS — Z4789 Encounter for other orthopedic aftercare: Secondary | ICD-10-CM | POA: Diagnosis not present

## 2014-05-31 DIAGNOSIS — M5116 Intervertebral disc disorders with radiculopathy, lumbar region: Secondary | ICD-10-CM | POA: Diagnosis not present

## 2014-06-02 DIAGNOSIS — M419 Scoliosis, unspecified: Secondary | ICD-10-CM | POA: Diagnosis not present

## 2014-06-02 DIAGNOSIS — M5416 Radiculopathy, lumbar region: Secondary | ICD-10-CM | POA: Diagnosis not present

## 2014-06-02 DIAGNOSIS — Z4789 Encounter for other orthopedic aftercare: Secondary | ICD-10-CM | POA: Diagnosis not present

## 2014-06-02 DIAGNOSIS — M4316 Spondylolisthesis, lumbar region: Secondary | ICD-10-CM | POA: Diagnosis not present

## 2014-06-02 DIAGNOSIS — M5116 Intervertebral disc disorders with radiculopathy, lumbar region: Secondary | ICD-10-CM | POA: Diagnosis not present

## 2014-06-03 DIAGNOSIS — M5137 Other intervertebral disc degeneration, lumbosacral region: Secondary | ICD-10-CM | POA: Diagnosis not present

## 2014-06-07 DIAGNOSIS — Z4789 Encounter for other orthopedic aftercare: Secondary | ICD-10-CM | POA: Diagnosis not present

## 2014-06-07 DIAGNOSIS — M419 Scoliosis, unspecified: Secondary | ICD-10-CM | POA: Diagnosis not present

## 2014-06-07 DIAGNOSIS — M5116 Intervertebral disc disorders with radiculopathy, lumbar region: Secondary | ICD-10-CM | POA: Diagnosis not present

## 2014-06-07 DIAGNOSIS — M4316 Spondylolisthesis, lumbar region: Secondary | ICD-10-CM | POA: Diagnosis not present

## 2014-06-07 DIAGNOSIS — M5416 Radiculopathy, lumbar region: Secondary | ICD-10-CM | POA: Diagnosis not present

## 2014-06-08 DIAGNOSIS — I10 Essential (primary) hypertension: Secondary | ICD-10-CM | POA: Diagnosis not present

## 2014-06-08 DIAGNOSIS — H6092 Unspecified otitis externa, left ear: Secondary | ICD-10-CM | POA: Diagnosis not present

## 2014-06-10 DIAGNOSIS — Z4789 Encounter for other orthopedic aftercare: Secondary | ICD-10-CM | POA: Diagnosis not present

## 2014-06-10 DIAGNOSIS — M5416 Radiculopathy, lumbar region: Secondary | ICD-10-CM | POA: Diagnosis not present

## 2014-06-10 DIAGNOSIS — M4316 Spondylolisthesis, lumbar region: Secondary | ICD-10-CM | POA: Diagnosis not present

## 2014-06-10 DIAGNOSIS — M419 Scoliosis, unspecified: Secondary | ICD-10-CM | POA: Diagnosis not present

## 2014-06-10 DIAGNOSIS — M5116 Intervertebral disc disorders with radiculopathy, lumbar region: Secondary | ICD-10-CM | POA: Diagnosis not present

## 2014-06-15 DIAGNOSIS — M419 Scoliosis, unspecified: Secondary | ICD-10-CM | POA: Diagnosis not present

## 2014-06-15 DIAGNOSIS — M4316 Spondylolisthesis, lumbar region: Secondary | ICD-10-CM | POA: Diagnosis not present

## 2014-06-15 DIAGNOSIS — M5416 Radiculopathy, lumbar region: Secondary | ICD-10-CM | POA: Diagnosis not present

## 2014-06-15 DIAGNOSIS — M5116 Intervertebral disc disorders with radiculopathy, lumbar region: Secondary | ICD-10-CM | POA: Diagnosis not present

## 2014-06-15 DIAGNOSIS — Z4789 Encounter for other orthopedic aftercare: Secondary | ICD-10-CM | POA: Diagnosis not present

## 2014-06-16 DIAGNOSIS — M5416 Radiculopathy, lumbar region: Secondary | ICD-10-CM | POA: Diagnosis not present

## 2014-06-16 DIAGNOSIS — Z4789 Encounter for other orthopedic aftercare: Secondary | ICD-10-CM | POA: Diagnosis not present

## 2014-06-16 DIAGNOSIS — M419 Scoliosis, unspecified: Secondary | ICD-10-CM | POA: Diagnosis not present

## 2014-06-16 DIAGNOSIS — M5116 Intervertebral disc disorders with radiculopathy, lumbar region: Secondary | ICD-10-CM | POA: Diagnosis not present

## 2014-06-16 DIAGNOSIS — M4316 Spondylolisthesis, lumbar region: Secondary | ICD-10-CM | POA: Diagnosis not present

## 2014-06-21 DIAGNOSIS — M419 Scoliosis, unspecified: Secondary | ICD-10-CM | POA: Diagnosis not present

## 2014-06-21 DIAGNOSIS — M4316 Spondylolisthesis, lumbar region: Secondary | ICD-10-CM | POA: Diagnosis not present

## 2014-06-21 DIAGNOSIS — M5116 Intervertebral disc disorders with radiculopathy, lumbar region: Secondary | ICD-10-CM | POA: Diagnosis not present

## 2014-06-21 DIAGNOSIS — Z4789 Encounter for other orthopedic aftercare: Secondary | ICD-10-CM | POA: Diagnosis not present

## 2014-06-21 DIAGNOSIS — M5416 Radiculopathy, lumbar region: Secondary | ICD-10-CM | POA: Diagnosis not present

## 2014-06-23 DIAGNOSIS — M5416 Radiculopathy, lumbar region: Secondary | ICD-10-CM | POA: Diagnosis not present

## 2014-06-23 DIAGNOSIS — M5116 Intervertebral disc disorders with radiculopathy, lumbar region: Secondary | ICD-10-CM | POA: Diagnosis not present

## 2014-06-23 DIAGNOSIS — Z4789 Encounter for other orthopedic aftercare: Secondary | ICD-10-CM | POA: Diagnosis not present

## 2014-06-23 DIAGNOSIS — M4316 Spondylolisthesis, lumbar region: Secondary | ICD-10-CM | POA: Diagnosis not present

## 2014-06-23 DIAGNOSIS — M419 Scoliosis, unspecified: Secondary | ICD-10-CM | POA: Diagnosis not present

## 2014-06-29 DIAGNOSIS — Z4789 Encounter for other orthopedic aftercare: Secondary | ICD-10-CM | POA: Diagnosis not present

## 2014-06-29 DIAGNOSIS — M419 Scoliosis, unspecified: Secondary | ICD-10-CM | POA: Diagnosis not present

## 2014-06-29 DIAGNOSIS — M5416 Radiculopathy, lumbar region: Secondary | ICD-10-CM | POA: Diagnosis not present

## 2014-06-29 DIAGNOSIS — M4316 Spondylolisthesis, lumbar region: Secondary | ICD-10-CM | POA: Diagnosis not present

## 2014-06-29 DIAGNOSIS — M5116 Intervertebral disc disorders with radiculopathy, lumbar region: Secondary | ICD-10-CM | POA: Diagnosis not present

## 2014-07-01 DIAGNOSIS — M5116 Intervertebral disc disorders with radiculopathy, lumbar region: Secondary | ICD-10-CM | POA: Diagnosis not present

## 2014-07-01 DIAGNOSIS — M5137 Other intervertebral disc degeneration, lumbosacral region: Secondary | ICD-10-CM | POA: Diagnosis not present

## 2014-07-01 DIAGNOSIS — M4316 Spondylolisthesis, lumbar region: Secondary | ICD-10-CM | POA: Diagnosis not present

## 2014-07-01 DIAGNOSIS — Z4789 Encounter for other orthopedic aftercare: Secondary | ICD-10-CM | POA: Diagnosis not present

## 2014-07-01 DIAGNOSIS — M419 Scoliosis, unspecified: Secondary | ICD-10-CM | POA: Diagnosis not present

## 2014-07-01 DIAGNOSIS — M5416 Radiculopathy, lumbar region: Secondary | ICD-10-CM | POA: Diagnosis not present

## 2014-07-07 DIAGNOSIS — M6281 Muscle weakness (generalized): Secondary | ICD-10-CM | POA: Diagnosis not present

## 2014-07-07 DIAGNOSIS — R2681 Unsteadiness on feet: Secondary | ICD-10-CM | POA: Diagnosis not present

## 2014-07-07 DIAGNOSIS — R262 Difficulty in walking, not elsewhere classified: Secondary | ICD-10-CM | POA: Diagnosis not present

## 2014-07-07 DIAGNOSIS — M545 Low back pain: Secondary | ICD-10-CM | POA: Diagnosis not present

## 2014-07-10 ENCOUNTER — Emergency Department (HOSPITAL_COMMUNITY): Payer: Medicare Other

## 2014-07-10 ENCOUNTER — Encounter (HOSPITAL_COMMUNITY): Payer: Self-pay | Admitting: *Deleted

## 2014-07-10 ENCOUNTER — Emergency Department (HOSPITAL_COMMUNITY)
Admission: EM | Admit: 2014-07-10 | Discharge: 2014-07-11 | Disposition: A | Discharge status: Home / Self Care | Payer: Medicare Other | Attending: Emergency Medicine | Admitting: Emergency Medicine

## 2014-07-10 DIAGNOSIS — S4992XA Unspecified injury of left shoulder and upper arm, initial encounter: Secondary | ICD-10-CM | POA: Insufficient documentation

## 2014-07-10 DIAGNOSIS — M25512 Pain in left shoulder: Secondary | ICD-10-CM | POA: Diagnosis not present

## 2014-07-10 DIAGNOSIS — I10 Essential (primary) hypertension: Secondary | ICD-10-CM | POA: Insufficient documentation

## 2014-07-10 DIAGNOSIS — Y9289 Other specified places as the place of occurrence of the external cause: Secondary | ICD-10-CM | POA: Insufficient documentation

## 2014-07-10 DIAGNOSIS — G8929 Other chronic pain: Secondary | ICD-10-CM | POA: Insufficient documentation

## 2014-07-10 DIAGNOSIS — M25419 Effusion, unspecified shoulder: Secondary | ICD-10-CM | POA: Diagnosis not present

## 2014-07-10 DIAGNOSIS — W010XXA Fall on same level from slipping, tripping and stumbling without subsequent striking against object, initial encounter: Secondary | ICD-10-CM | POA: Insufficient documentation

## 2014-07-10 DIAGNOSIS — S199XXA Unspecified injury of neck, initial encounter: Secondary | ICD-10-CM | POA: Insufficient documentation

## 2014-07-10 DIAGNOSIS — E039 Hypothyroidism, unspecified: Secondary | ICD-10-CM | POA: Insufficient documentation

## 2014-07-10 DIAGNOSIS — Y9389 Activity, other specified: Secondary | ICD-10-CM | POA: Insufficient documentation

## 2014-07-10 DIAGNOSIS — W19XXXA Unspecified fall, initial encounter: Secondary | ICD-10-CM

## 2014-07-10 DIAGNOSIS — Z7982 Long term (current) use of aspirin: Secondary | ICD-10-CM | POA: Insufficient documentation

## 2014-07-10 DIAGNOSIS — Z87440 Personal history of urinary (tract) infections: Secondary | ICD-10-CM | POA: Insufficient documentation

## 2014-07-10 DIAGNOSIS — Z79899 Other long term (current) drug therapy: Secondary | ICD-10-CM | POA: Diagnosis not present

## 2014-07-10 DIAGNOSIS — Y998 Other external cause status: Secondary | ICD-10-CM | POA: Diagnosis not present

## 2014-07-10 DIAGNOSIS — Z872 Personal history of diseases of the skin and subcutaneous tissue: Secondary | ICD-10-CM | POA: Diagnosis not present

## 2014-07-10 DIAGNOSIS — S0990XA Unspecified injury of head, initial encounter: Secondary | ICD-10-CM | POA: Diagnosis not present

## 2014-07-10 DIAGNOSIS — M542 Cervicalgia: Secondary | ICD-10-CM | POA: Diagnosis not present

## 2014-07-10 MED ORDER — OXYCODONE-ACETAMINOPHEN 5-325 MG PO TABS
1.0000 | ORAL_TABLET | Freq: Once | ORAL | Status: AC
  Administered 2014-07-10: 1 via ORAL
  Filled 2014-07-10: qty 1

## 2014-07-10 MED ORDER — FENTANYL CITRATE (PF) 100 MCG/2ML IJ SOLN
100.0000 ug | Freq: Once | INTRAMUSCULAR | Status: AC
  Administered 2014-07-10: 100 ug via NASAL
  Filled 2014-07-10: qty 2

## 2014-07-10 NOTE — ED Provider Notes (Signed)
CSN: [id number]     Arrival date & time 07/10/14  2104 History   First MD Initiated Contact with Patient 07/10/14 2205     Chief Complaint  Patient presents with  . Fall     (Consider location/radiation/quality/duration/timing/severity/associated sxs/prior Treatment) HPI Comments: Patient is a 74 year old female past medical history significant for hypertension, chronic back pain presented to the emergency department after a fall yesterday. Patient states she had a recent lumbar fusion surgery 5 weeks ago and is undergoing physical therapy, at times loses her balance. She states yesterday was one of those days, fell landing on her left shoulder. She states initially she did not have any pain, when she awoke today she had worsening pain. She states she has tried her at-home pain medications which she has not taken in several weeks with no relief. Her doctor called and Ashley Mercer which also provided no improvement. She does not believe she hit her head. Denies any loss of consciousness or emesis. She states her shoulder pain is worsened with movement. Denies any numbness or weakness.   Past Medical History  Diagnosis Date  . Hypertension   . Thyroid condition   . Chronic back pain   . Cervical spinal stenosis   . Hypothyroidism   . Frequent UTI   . Dry skin   . Eczema   . History of blood in urine     Pt was seen by [facility]; scant amount; no current issues  . Fatty tumor     Right wrist  . Numbness and tingling in hands     Bilateral   Past Surgical History  Procedure Laterality Date  . Thyroidectomy  1971  . Total abdominal hysterectomy  1986  . Cervical spine surgery  01/28/2009  . Breast biopsy Right 2006    benign  . Colonoscopy w/ polypectomy    . Tonsillectomy    . Appendectomy     Family History  Problem Relation Age of Onset  . Heart disease Mother   . Kidney failure Father    History  Substance Use Topics  . Smoking status: Former Research scientist (life sciences)  . Smokeless  tobacco: Never Used  . Alcohol Use: Yes     Comment: quit in 1997; wine occasionally   OB History    No data available     Review of Systems  Eyes: Negative for visual disturbance.  Respiratory: Negative for shortness of breath.   Cardiovascular: Negative for chest pain and leg swelling.  Gastrointestinal: Negative for nausea, vomiting and abdominal pain.  Musculoskeletal: Positive for myalgias, arthralgias and neck pain.  Neurological: Negative for dizziness, weakness, light-headedness, numbness and headaches.      Allergies  Review of patient's allergies indicates no known allergies.  Home Medications   Prior to Admission medications   Medication Sig Start Date End Date Taking? Authorizing Provider  aspirin EC 81 MG tablet Take 81 mg by mouth daily.    Historical Provider, MD  colesevelam (WELCHOL) 625 MG tablet Take 1,250 mg by mouth 2 (two) times daily with a meal.    Historical Provider, MD  cyclobenzaprine (FLEXERIL) 10 MG tablet Take 1 tablet (10 mg total) by mouth 2 (two) times daily as needed for muscle spasms. 07/11/14   Pattie Flaharty, PA-C  docusate sodium (COLACE) 100 MG capsule Take 100 mg by mouth 2 (two) times daily.    Historical Provider, MD  DULoxetine (CYMBALTA) 60 MG capsule Take 60 mg by mouth at bedtime.     Historical Provider,  MD  gemfibrozil (LOPID) 600 MG tablet Take 600 mg by mouth daily.    Historical Provider, MD  HYDROcodone-acetaminophen (NORCO/VICODIN) 5-325 MG per tablet Take 1 tablet by mouth at bedtime.    Historical Provider, MD  HYDROcodone-acetaminophen (NORCO/VICODIN) 5-325 MG per tablet Take 1-2 tablets by mouth every 6 (six) hours as needed for severe pain. 07/11/14   Chevis Weisensel, PA-C  ibuprofen (ADVIL,MOTRIN) 800 MG tablet Take 800 mg by mouth 2 (two) times daily as needed (back pain).     Historical Provider, MD  levothyroxine (SYNTHROID, LEVOTHROID) 100 MCG tablet Take 100 mcg by mouth daily before breakfast.    Historical  Provider, MD  metoprolol succinate (TOPROL-XL) 100 MG 24 hr tablet Take 100 mg by mouth daily.  05/03/14   Historical Provider, MD  Multiple Vitamins-Minerals (MULTIVITAMIN PO) Take 1 tablet by mouth daily.     Historical Provider, MD  quinapril-hydrochlorothiazide (ACCURETIC) 20-25 MG per tablet Take 1 tablet by mouth daily.    Historical Provider, MD  raloxifene (EVISTA) 60 MG tablet Take 60 mg by mouth daily.    Historical Provider, MD  ranitidine (ZANTAC) 300 MG capsule Take 300 mg by mouth 2 (two) times daily.    Historical Provider, MD  traMADol (ULTRAM) 50 MG tablet Take 50 mg by mouth every 6 (six) hours as needed. Takes two tablets if needed for pain    Historical Provider, MD  triamcinolone cream (KENALOG) 0.5 % Apply 1 application topically daily as needed (dry skin).  05/03/14   Historical Provider, MD   BP 128/64 mmHg  Pulse 66  Temp(Src) 98 F (36.7 C) (Oral)  Resp 16  Wt 207 lb (93.895 kg)  SpO2 97% Physical Exam  Constitutional: She is oriented to person, place, and time. She appears well-developed and well-nourished. No distress.  HENT:  Head: Normocephalic and atraumatic.  Right Ear: External ear normal.  Left Ear: External ear normal.  Nose: Nose normal.  Mouth/Throat: Oropharynx is clear and moist. No oropharyngeal exudate.  Eyes: Conjunctivae and EOM are normal. Pupils are equal, round, and reactive to light.  Neck: Normal range of motion. Neck supple.  No nuchal rigidity.   Cardiovascular: Normal rate, regular rhythm, normal heart sounds and intact distal pulses.   Pulmonary/Chest: Effort normal and breath sounds normal. She exhibits no tenderness.  Abdominal: Soft. Bowel sounds are normal. There is no tenderness.  Musculoskeletal: She exhibits no edema.       Left shoulder: She exhibits decreased range of motion (Increased pain with attempted Ranging above head) and tenderness. She exhibits no deformity and no laceration.       Cervical back: She exhibits  tenderness and spasm. She exhibits normal range of motion, no bony tenderness and no deformity.       Back:  Neurological: She is alert and oriented to person, place, and time. No cranial nerve deficit.  Bilateral heel-knee-shin intact.  Sensation grossly intact.   Skin: Skin is warm and dry. She is not diaphoretic.  Psychiatric: She has a normal mood and affect.  Nursing note and vitals reviewed.   ED Course  Procedures (including critical care time) Labs Review Labs Reviewed - No data to display  Imaging Review Dg Cervical Spine Complete  07/10/2014   CLINICAL DATA:  Fall 2 days ago, left neck pain  EXAM: CERVICAL SPINE  4+ VIEWS  COMPARISON:  None.  FINDINGS: Normal cervical lordosis.  No evidence of fracture or dislocation. Vertebral heights are maintained. Dens appears intact. Lateral masses  of C1 are symmetric.  No prevertebral soft tissue swelling.  Status post C4-7 ACDF with interbody fusion. No evidence of hardware complication.  Bilateral neural foramina are patent.  Visualized lung apices are clear.  IMPRESSION: Status post C4-7 ACDF, without evidence of hardware complication.  No fracture or dislocation is seen.   Electronically Signed   By: Julian Hy M.D.   On: 07/10/2014 21:49   Ct Head Wo Contrast  07/11/2014   CLINICAL DATA:  Status post fall. Landed on left side, with severe left shoulder and neck pain. Concern for head injury. Initial encounter.  EXAM: CT HEAD WITHOUT CONTRAST  CT CERVICAL SPINE WITHOUT CONTRAST  TECHNIQUE: Multidetector CT imaging of the head and cervical spine was performed following the standard protocol without intravenous contrast. Multiplanar CT image reconstructions of the cervical spine were also generated.  COMPARISON:  Cervical spine radiographs performed earlier today at 9:24 p.m., MRI of the cervical spine performed 01/20/2009, and MRI of the brain from 11/29/2008  FINDINGS: CT HEAD FINDINGS  There is no evidence of acute infarction, mass  lesion, or intra- or extra-axial hemorrhage on CT.  Mild periventricular white matter change likely reflects small vessel ischemic microangiopathy.  The posterior fossa, including the cerebellum, brainstem and fourth ventricle, is within normal limits. The third and lateral ventricles, and basal ganglia are unremarkable in appearance. The cerebral hemispheres demonstrate grossly normal gray-white differentiation. No mass effect or midline shift is seen.  There is no evidence of fracture; visualized osseous structures are unremarkable in appearance. The visualized portions of the orbits are within normal limits. The paranasal sinuses and mastoid air cells are well-aerated. No significant soft tissue abnormalities are seen.  CT CERVICAL SPINE FINDINGS  There is no evidence of fracture or subluxation. The patient is status post anterior cervical spinal fusion at C4-C7. Underlying degenerative change is noted, with mild grade 1 anterolisthesis of C7 on T1. Disc space narrowing is noted at T1-T2. Vertebral bodies demonstrate normal height and alignment. Prevertebral soft tissues are within normal limits.  The thyroid gland is unremarkable in appearance. The visualized lung apices are clear. There is diffuse prominence of collateral vasculature about the neck, of uncertain significance.  IMPRESSION: 1. No evidence of traumatic intracranial injury or fracture. 2. No evidence of fracture or subluxation along the cervical spine. 3. Mild small vessel ischemic microangiopathy noted. 4. Status post anterior cervical spinal fusion at C4-C7, with underlying degenerative change. 5. Diffuse prominence of collateral vasculature about the neck, of uncertain significance.   Electronically Signed   By: Garald Balding M.D.   On: 07/11/2014 00:09   Ct Cervical Spine Wo Contrast  07/11/2014   CLINICAL DATA:  Status post fall. Landed on left side, with severe left shoulder and neck pain. Concern for head injury. Initial encounter.   EXAM: CT HEAD WITHOUT CONTRAST  CT CERVICAL SPINE WITHOUT CONTRAST  TECHNIQUE: Multidetector CT imaging of the head and cervical spine was performed following the standard protocol without intravenous contrast. Multiplanar CT image reconstructions of the cervical spine were also generated.  COMPARISON:  Cervical spine radiographs performed earlier today at 9:24 p.m., MRI of the cervical spine performed 01/20/2009, and MRI of the brain from 11/29/2008  FINDINGS: CT HEAD FINDINGS  There is no evidence of acute infarction, mass lesion, or intra- or extra-axial hemorrhage on CT.  Mild periventricular white matter change likely reflects small vessel ischemic microangiopathy.  The posterior fossa, including the cerebellum, brainstem and fourth ventricle, is within normal limits. The  third and lateral ventricles, and basal ganglia are unremarkable in appearance. The cerebral hemispheres demonstrate grossly normal gray-white differentiation. No mass effect or midline shift is seen.  There is no evidence of fracture; visualized osseous structures are unremarkable in appearance. The visualized portions of the orbits are within normal limits. The paranasal sinuses and mastoid air cells are well-aerated. No significant soft tissue abnormalities are seen.  CT CERVICAL SPINE FINDINGS  There is no evidence of fracture or subluxation. The patient is status post anterior cervical spinal fusion at C4-C7. Underlying degenerative change is noted, with mild grade 1 anterolisthesis of C7 on T1. Disc space narrowing is noted at T1-T2. Vertebral bodies demonstrate normal height and alignment. Prevertebral soft tissues are within normal limits.  The thyroid gland is unremarkable in appearance. The visualized lung apices are clear. There is diffuse prominence of collateral vasculature about the neck, of uncertain significance.  IMPRESSION: 1. No evidence of traumatic intracranial injury or fracture. 2. No evidence of fracture or subluxation  along the cervical spine. 3. Mild small vessel ischemic microangiopathy noted. 4. Status post anterior cervical spinal fusion at C4-C7, with underlying degenerative change. 5. Diffuse prominence of collateral vasculature about the neck, of uncertain significance.   Electronically Signed   By: Garald Balding M.D.   On: 07/11/2014 00:09   Dg Shoulder Left  07/10/2014   CLINICAL DATA:  [place] 2 days ago with left shoulder pain  EXAM: LEFT SHOULDER - 2+ VIEW  COMPARISON:  None.  FINDINGS: Tiny focus of ossification adjacent to the humeral head laterally on AP view likely calcific tendinitis. Humeral head appears somewhat anterior on some of the images but not consistently. It does not appear to be inferiorly position. No evidence of surgical neck fracture identified.  IMPRESSION: Some of the images seen to suggest anterior dislocation although the findings do not confirm this. No surgical neck fracture of the humerus. Tiny focus of ossification adjacent to the humeral head may represent calcific tendinosis of the rotator cuff tendons. If there has been shoulder dislocation, this could represent a tiny fracture fragment.   Electronically Signed   By: Skipper Cliche M.D.   On: 07/10/2014 21:53     EKG Interpretation None      MDM   Final diagnoses:  Fall, initial encounter  Left shoulder pain   Filed Vitals:   07/10/14 2333  BP: 128/64  Pulse: 66  Temp:   Resp: 16     Afebrile, NAD, non-toxic appearing, AAOx4.   Patient presenting after mechanical fall. No neurofocal deficits. No obvious deformity to left shoulder. Muscle tightness noted left cervical paraspinal muscles. Neurovascularly intact. Normal sensation. No evidence of compartment syndrome. Images reviewed without acute abnormality. I personally reviewed the imaging and agree with the radiologist. After pain medication patient is more freely moving left shoulder. Will provide a sling for comfort and support. Advised PCP and orthopedic  follow-up for re-check. Patient / Family / Caregiver informed of clinical course, understand medical decision-making and is agreeable to plan. Patient is stable at time of discharge       Patient d/w with Dr. Mingo Amber, agrees with plan.    Baron Sane, PA-C 07/11/14 0123  Evelina Bucy, MD 07/11/14 [phone]

## 2014-07-10 NOTE — ED Notes (Signed)
Pt in after a fall yesterday, pt tripped and landed on her left side, most of the impact was on her shoulder, today c/o severe pain to her left shoulder and neck, history of cervical fusion and lumbar fusion in the past, called her MD and they called her in pain medication that has not helped, pain worse with movement

## 2014-07-10 NOTE — ED Notes (Signed)
PA at bedside.

## 2014-07-11 MED ORDER — HYDROCODONE-ACETAMINOPHEN 5-325 MG PO TABS: 1.0000 | ORAL_TABLET | Freq: Four times a day (QID) | ORAL | Status: DC | PRN

## 2014-07-11 MED ORDER — CYCLOBENZAPRINE HCL 10 MG PO TABS: 10.0000 mg | ORAL_TABLET | Freq: Two times a day (BID) | ORAL | Status: DC | PRN

## 2014-07-11 NOTE — Discharge Instructions (Signed)
Please follow up with your primary care physician in 1-2 days. If you do not have one please call the [place] number listed above. Please follow up with Dr. Erlinda Hong to schedule a follow up appointment.  Please read all discharge instructions and return precautions.   Shoulder Pain The shoulder is the joint that connects your arms to your body. The bones that form the shoulder joint include the upper arm bone (humerus), the shoulder blade (scapula), and the collarbone (clavicle). The top of the humerus is shaped like a ball and fits into a rather flat socket on the scapula (glenoid cavity). A combination of muscles and strong, fibrous tissues that connect muscles to bones (tendons) support your shoulder joint and hold the ball in the socket. Small, fluid-filled sacs (bursae) are located in different areas of the joint. They act as cushions between the bones and the overlying soft tissues and help reduce friction between the gliding tendons and the bone as you move your arm. Your shoulder joint allows a wide range of motion in your arm. This range of motion allows you to do things like scratch your back or throw a ball. However, this range of motion also makes your shoulder more prone to pain from overuse and injury. Causes of shoulder pain can originate from both injury and overuse and usually can be grouped in the following four categories:  Redness, swelling, and pain (inflammation) of the tendon (tendinitis) or the bursae (bursitis).  Instability, such as a dislocation of the joint.  Inflammation of the joint (arthritis).  Broken bone (fracture). HOME CARE INSTRUCTIONS   Apply ice to the sore area.  Put ice in a plastic bag.  Place a towel between your skin and the bag.  Leave the ice on for 15-20 minutes, 3-4 times per day for the first 2 days, or as directed by your health care provider.  Stop using cold packs if they do not help with the pain.  If you have a shoulder  sling or immobilizer, wear it as long as your caregiver instructs. Only remove it to shower or bathe. Move your arm as little as possible, but keep your hand moving to prevent swelling.  Squeeze a soft ball or foam pad as much as possible to help prevent swelling.  Only take over-the-counter or prescription medicines for pain, discomfort, or fever as directed by your caregiver. SEEK MEDICAL CARE IF:   Your shoulder pain increases, or new pain develops in your arm, hand, or fingers.  Your hand or fingers become cold and numb.  Your pain is not relieved with medicines. SEEK IMMEDIATE MEDICAL CARE IF:   Your arm, hand, or fingers are numb or tingling.  Your arm, hand, or fingers are significantly swollen or turn white or blue. MAKE SURE YOU:   Understand these instructions.  Will watch your condition.  Will get help right away if you are not doing well or get worse. Document Released: 10/11/2004 Document Revised: 05/18/2013 Document Reviewed: 12/16/2010 [facility] Patient Information 2015 [place], [place]. This information is not intended to replace advice given to you by your health care provider. Make sure you discuss any questions you have with your health care provider.

## 2014-07-12 ENCOUNTER — Emergency Department (HOSPITAL_COMMUNITY)
Admission: EM | Admit: 2014-07-12 | Discharge: 2014-07-13 | Disposition: A | Discharge status: Home / Self Care | Payer: Medicare Other | Attending: Emergency Medicine | Admitting: Emergency Medicine

## 2014-07-12 ENCOUNTER — Encounter (HOSPITAL_COMMUNITY): Payer: Self-pay | Admitting: *Deleted

## 2014-07-12 ENCOUNTER — Emergency Department (HOSPITAL_COMMUNITY): Payer: Medicare Other

## 2014-07-12 DIAGNOSIS — Z79899 Other long term (current) drug therapy: Secondary | ICD-10-CM | POA: Diagnosis not present

## 2014-07-12 DIAGNOSIS — E039 Hypothyroidism, unspecified: Secondary | ICD-10-CM | POA: Diagnosis not present

## 2014-07-12 DIAGNOSIS — S8991XA Unspecified injury of right lower leg, initial encounter: Secondary | ICD-10-CM | POA: Diagnosis not present

## 2014-07-12 DIAGNOSIS — Z87891 Personal history of nicotine dependence: Secondary | ICD-10-CM | POA: Insufficient documentation

## 2014-07-12 DIAGNOSIS — Z7982 Long term (current) use of aspirin: Secondary | ICD-10-CM | POA: Diagnosis not present

## 2014-07-12 DIAGNOSIS — E669 Obesity, unspecified: Secondary | ICD-10-CM | POA: Diagnosis not present

## 2014-07-12 DIAGNOSIS — Z872 Personal history of diseases of the skin and subcutaneous tissue: Secondary | ICD-10-CM | POA: Diagnosis not present

## 2014-07-12 DIAGNOSIS — Z85828 Personal history of other malignant neoplasm of skin: Secondary | ICD-10-CM | POA: Diagnosis not present

## 2014-07-12 DIAGNOSIS — M25461 Effusion, right knee: Secondary | ICD-10-CM | POA: Diagnosis not present

## 2014-07-12 DIAGNOSIS — G8929 Other chronic pain: Secondary | ICD-10-CM | POA: Diagnosis not present

## 2014-07-12 DIAGNOSIS — M542 Cervicalgia: Secondary | ICD-10-CM | POA: Diagnosis not present

## 2014-07-12 DIAGNOSIS — Z87440 Personal history of urinary (tract) infections: Secondary | ICD-10-CM | POA: Insufficient documentation

## 2014-07-12 DIAGNOSIS — M25561 Pain in right knee: Secondary | ICD-10-CM | POA: Diagnosis present

## 2014-07-12 DIAGNOSIS — I10 Essential (primary) hypertension: Secondary | ICD-10-CM | POA: Diagnosis not present

## 2014-07-12 DIAGNOSIS — M79606 Pain in leg, unspecified: Secondary | ICD-10-CM | POA: Diagnosis not present

## 2014-07-12 LAB — CBC WITH DIFFERENTIAL/PLATELET
Basophils Absolute: 0 10*3/uL (ref 0.0–0.1)
Basophils Relative: 0 % (ref 0–1)
Eosinophils Absolute: 0 10*3/uL (ref 0.0–0.7)
Eosinophils Relative: 0 % (ref 0–5)
HCT: 35.8 % — ABNORMAL LOW (ref 36.0–46.0)
Hemoglobin: 11.6 g/dL — ABNORMAL LOW (ref 12.0–15.0)
Lymphocytes Relative: 24 % (ref 12–46)
Lymphs Abs: 3.5 10*3/uL (ref 0.7–4.0)
MCH: 26.1 pg (ref 26.0–34.0)
MCHC: 32.4 g/dL (ref 30.0–36.0)
MCV: 80.4 fL (ref 78.0–100.0)
Monocytes Absolute: 1.2 10*3/uL — ABNORMAL HIGH (ref 0.1–1.0)
Monocytes Relative: 9 % (ref 3–12)
Neutro Abs: 9.7 10*3/uL — ABNORMAL HIGH (ref 1.7–7.7)
Neutrophils Relative %: 67 % (ref 43–77)
Platelets: 411 10*3/uL — ABNORMAL HIGH (ref 150–400)
RBC: 4.45 MIL/uL (ref 3.87–5.11)
RDW: 15.2 % (ref 11.5–15.5)
WBC: 14.5 10*3/uL — ABNORMAL HIGH (ref 4.0–10.5)

## 2014-07-12 MED ORDER — FENTANYL CITRATE (PF) 100 MCG/2ML IJ SOLN
100.0000 ug | Freq: Once | INTRAMUSCULAR | Status: AC
  Administered 2014-07-13: 100 ug via INTRAVENOUS
  Filled 2014-07-12: qty 2

## 2014-07-12 MED ORDER — LIDOCAINE-EPINEPHRINE (PF) 2 %-1:200000 IJ SOLN
20.0000 mL | Freq: Once | INTRAMUSCULAR | Status: AC
  Administered 2014-07-12: 20 mL
  Filled 2014-07-12: qty 20

## 2014-07-12 MED ORDER — HYDROMORPHONE HCL 1 MG/ML IJ SOLN
1.0000 mg | Freq: Once | INTRAMUSCULAR | Status: AC
  Administered 2014-07-12: 1 mg via INTRAVENOUS
  Filled 2014-07-12: qty 1

## 2014-07-12 NOTE — ED Notes (Signed)
Pt was seen in ED on Friday after a pall. Evaluated and sent home. Pt states that she has been experiencing neck pain since. Pt spoke with her PCP who ordered a muscle relaxer and valium but they provided no relief. Also c/o rt knee pain pain since. States that her knee feels like it is popping and she cannot bare weight. Pt had a lumbar fusion L2-5 in April.

## 2014-07-12 NOTE — ED Provider Notes (Signed)
CSN: [id number]     Arrival date & time 07/12/14  2133 History   First MD Initiated Contact with Patient 07/12/14 2144     Chief Complaint  Patient presents with  . Knee Pain  . Neck Pain     (Consider location/radiation/quality/duration/timing/severity/associated sxs/prior Treatment) HPI  74 year old female presents with right knee pain that started today. Has been unable to lift leg off of the bed due to the severity of pain. Daughter has noticed that her knee was swollen as well. No associated redness. No fevers. Patient fell 3 days ago but did not injure this knee and has been walking on it normally until today. Patient is continuing to have left-sided neck pain since the fall. No weakness, numbness, or incontinence. Pain is on the left it is improved when the daughter elevates the patient's head. Rates her pain as severe.  Past Medical History  Diagnosis Date  . Hypertension   . Thyroid condition   . Chronic back pain   . Cervical spinal stenosis   . Hypothyroidism   . Frequent UTI   . Dry skin   . Eczema   . History of blood in urine     Pt was seen by [facility]; scant amount; no current issues  . Fatty tumor     Right wrist  . Numbness and tingling in hands     Bilateral   Past Surgical History  Procedure Laterality Date  . Thyroidectomy  1971  . Total abdominal hysterectomy  1986  . Cervical spine surgery  01/28/2009  . Breast biopsy Right 2006    benign  . Colonoscopy w/ polypectomy    . Tonsillectomy    . Appendectomy     Family History  Problem Relation Age of Onset  . Heart disease Mother   . Kidney failure Father    History  Substance Use Topics  . Smoking status: Former Research scientist (life sciences)  . Smokeless tobacco: Never Used  . Alcohol Use: Yes     Comment: quit in 1997; wine occasionally   OB History    No data available     Review of Systems  Constitutional: Negative for fever.  Musculoskeletal: Positive for joint swelling, arthralgias and neck pain.   Neurological: Negative for weakness and numbness.  All other systems reviewed and are negative.     Allergies  Review of patient's allergies indicates no known allergies.  Home Medications   Prior to Admission medications   Medication Sig Start Date End Date Taking? Authorizing Provider  aspirin EC 81 MG tablet Take 81 mg by mouth daily.   Yes Historical Provider, MD  colesevelam (WELCHOL) 625 MG tablet Take 1,250 mg by mouth 2 (two) times daily with a meal.   Yes Historical Provider, MD  cyclobenzaprine (FLEXERIL) 10 MG tablet Take 1 tablet (10 mg total) by mouth 2 (two) times daily as needed for muscle spasms. 07/11/14  Yes Jennifer Piepenbrink, PA-C  docusate sodium (COLACE) 100 MG capsule Take 100 mg by mouth 2 (two) times daily.   Yes Historical Provider, MD  DULoxetine (CYMBALTA) 60 MG capsule Take 60 mg by mouth at bedtime.    Yes Historical Provider, MD  gemfibrozil (LOPID) 600 MG tablet Take 600 mg by mouth daily.   Yes Historical Provider, MD  HYDROcodone-acetaminophen (NORCO/VICODIN) 5-325 MG per tablet Take 1-2 tablets by mouth every 6 (six) hours as needed for severe pain. 07/11/14  Yes Jennifer Piepenbrink, PA-C  levothyroxine (SYNTHROID, LEVOTHROID) 100 MCG tablet Take 100 mcg  by mouth daily before breakfast.   Yes Historical Provider, MD  metoprolol succinate (TOPROL-XL) 100 MG 24 hr tablet Take 100 mg by mouth daily.  05/03/14  Yes Historical Provider, MD  Multiple Vitamins-Minerals (MULTIVITAMIN PO) Take 1 tablet by mouth daily.    Yes Historical Provider, MD  quinapril-hydrochlorothiazide (ACCURETIC) 20-25 MG per tablet Take 1 tablet by mouth daily.   Yes Historical Provider, MD  raloxifene (EVISTA) 60 MG tablet Take 60 mg by mouth daily.   Yes Historical Provider, MD  ranitidine (ZANTAC) 300 MG capsule Take 300 mg by mouth 2 (two) times daily.   Yes Historical Provider, MD  traMADol (ULTRAM) 50 MG tablet Take 50 mg by mouth every 6 (six) hours as needed for moderate pain.  Takes two tablets if needed for pain   Yes Historical Provider, MD  triamcinolone cream (KENALOG) 0.5 % Apply 1 application topically daily as needed (dry skin).  05/03/14  Yes Historical Provider, MD   BP 110/57 mmHg  Pulse 81  Temp(Src) 99.1 F (37.3 C) (Oral)  Ht $R'5\' 2"'NV$  (1.575 m)  Wt 207 lb (93.895 kg)  BMI 37.85 kg/m2  SpO2 93% Physical Exam  Constitutional: She is oriented to person, place, and time. She appears well-developed and well-nourished.  obese  HENT:  Head: Normocephalic and atraumatic.  Right Ear: External ear normal.  Left Ear: External ear normal.  Nose: Nose normal.  Eyes: Right eye exhibits no discharge. Left eye exhibits no discharge.  Neck: Neck supple. Muscular tenderness present. No spinous process tenderness present.    Cardiovascular: Normal rate, regular rhythm and normal heart sounds.   Pulmonary/Chest: Effort normal and breath sounds normal.  Abdominal: Soft. She exhibits no distension. There is no tenderness.  Musculoskeletal:       Right knee: She exhibits decreased range of motion and swelling. She exhibits no erythema. Tenderness found.  Right knee warm without redness. Due to obesity difficult to tell if joint effusion  Neurological: She is alert and oriented to person, place, and time.  Skin: Skin is warm and dry.  Nursing note and vitals reviewed.   ED Course  Procedures (including critical care time) Labs Review Labs Reviewed  BASIC METABOLIC PANEL - Abnormal; Notable for the following:    Sodium 134 (*)    Potassium 3.1 (*)    Chloride 99 (*)    Glucose, Bld 120 (*)    All other components within normal limits  CBC WITH DIFFERENTIAL/PLATELET - Abnormal; Notable for the following:    WBC 14.5 (*)    Hemoglobin 11.6 (*)    HCT 35.8 (*)    Platelets 411 (*)    Neutro Abs 9.7 (*)    Monocytes Absolute 1.2 (*)    All other components within normal limits  SEDIMENTATION RATE - Abnormal; Notable for the following:    Sed Rate 65 (*)      All other components within normal limits  GRAM STAIN  BODY FLUID CULTURE  SYNOVIAL CELL COUNT + DIFF, W/ CRYSTALS    Imaging Review Dg Knee Complete 4 Views Right  07/12/2014   CLINICAL DATA:  Status post fall 07/11/2014. Right knee pain. Initial encounter.  EXAM: RIGHT KNEE - COMPLETE 4+ VIEW  COMPARISON:  None.  FINDINGS: There is no acute bony or joint abnormality. Small joint effusion is seen. Degenerative change about the knee appears most notable in the patellofemoral and lateral compartments.  IMPRESSION: Negative fracture.  Osteoarthritis.   Electronically Signed   By: Marcello Moores  Dalessio M.D.   On: 07/12/2014 22:53     EKG Interpretation None      MDM   Final diagnoses:  Knee swelling, right    Given her atraumatic right knee pain I attempted a right knee arthrocentesis to r/o septic arthritis. Although I feel I was in the correct anatomic landmarks was unable to aspirate any fluid. Swelling is more likely to be superficial. I did discuss this case with Dr. Lorin Mercy of orthopedics, he recommends sending home tonight and follow-up at his clinic around 8 AM tomorrow. She is not febrile here, does have an elevated white blood cell count and ESR, although these are nonspecific. I discussed the importance of following up with Dr. Lorin Mercy in the morning, and family agrees and will follow this plan.    Sherwood Gambler, MD 07/13/14 [phone]

## 2014-07-13 DIAGNOSIS — M25461 Effusion, right knee: Secondary | ICD-10-CM | POA: Diagnosis not present

## 2014-07-13 LAB — BASIC METABOLIC PANEL
Anion gap: 10 (ref 5–15)
BUN: 12 mg/dL (ref 6–20)
CO2: 25 mmol/L (ref 22–32)
Calcium: 8.9 mg/dL (ref 8.9–10.3)
Chloride: 99 mmol/L — ABNORMAL LOW (ref 101–111)
Creatinine, Ser: 0.88 mg/dL (ref 0.44–1.00)
GFR calc Af Amer: >60 mL/min (ref >60)
GFR calc non Af Amer: >60 mL/min (ref >60)
Glucose, Bld: 120 mg/dL — ABNORMAL HIGH (ref 65–99)
Potassium: 3.1 mmol/L — ABNORMAL LOW (ref 3.5–5.1)
Sodium: 134 mmol/L — ABNORMAL LOW (ref 135–145)

## 2014-07-13 LAB — SEDIMENTATION RATE: Sed Rate: 65 mm/hr — ABNORMAL HIGH (ref 0–22)

## 2014-07-13 NOTE — Discharge Instructions (Signed)

## 2014-07-14 DIAGNOSIS — S8001XA Contusion of right knee, initial encounter: Secondary | ICD-10-CM | POA: Diagnosis not present

## 2014-07-21 DIAGNOSIS — R2681 Unsteadiness on feet: Secondary | ICD-10-CM | POA: Diagnosis not present

## 2014-07-21 DIAGNOSIS — M545 Low back pain: Secondary | ICD-10-CM | POA: Diagnosis not present

## 2014-07-21 DIAGNOSIS — M6281 Muscle weakness (generalized): Secondary | ICD-10-CM | POA: Diagnosis not present

## 2014-07-21 DIAGNOSIS — R262 Difficulty in walking, not elsewhere classified: Secondary | ICD-10-CM | POA: Diagnosis not present

## 2014-07-23 DIAGNOSIS — M545 Low back pain: Secondary | ICD-10-CM | POA: Diagnosis not present

## 2014-07-23 DIAGNOSIS — M6281 Muscle weakness (generalized): Secondary | ICD-10-CM | POA: Diagnosis not present

## 2014-07-23 DIAGNOSIS — R2681 Unsteadiness on feet: Secondary | ICD-10-CM | POA: Diagnosis not present

## 2014-07-23 DIAGNOSIS — R262 Difficulty in walking, not elsewhere classified: Secondary | ICD-10-CM | POA: Diagnosis not present

## 2014-07-26 DIAGNOSIS — M6281 Muscle weakness (generalized): Secondary | ICD-10-CM | POA: Diagnosis not present

## 2014-07-26 DIAGNOSIS — R262 Difficulty in walking, not elsewhere classified: Secondary | ICD-10-CM | POA: Diagnosis not present

## 2014-07-26 DIAGNOSIS — R2681 Unsteadiness on feet: Secondary | ICD-10-CM | POA: Diagnosis not present

## 2014-07-26 DIAGNOSIS — M545 Low back pain: Secondary | ICD-10-CM | POA: Diagnosis not present

## 2014-07-28 DIAGNOSIS — M6281 Muscle weakness (generalized): Secondary | ICD-10-CM | POA: Diagnosis not present

## 2014-07-28 DIAGNOSIS — R2681 Unsteadiness on feet: Secondary | ICD-10-CM | POA: Diagnosis not present

## 2014-07-28 DIAGNOSIS — R262 Difficulty in walking, not elsewhere classified: Secondary | ICD-10-CM | POA: Diagnosis not present

## 2014-07-28 DIAGNOSIS — M545 Low back pain: Secondary | ICD-10-CM | POA: Diagnosis not present

## 2014-07-29 DIAGNOSIS — S8001XD Contusion of right knee, subsequent encounter: Secondary | ICD-10-CM | POA: Diagnosis not present

## 2014-07-30 DIAGNOSIS — R262 Difficulty in walking, not elsewhere classified: Secondary | ICD-10-CM | POA: Diagnosis not present

## 2014-07-30 DIAGNOSIS — M545 Low back pain: Secondary | ICD-10-CM | POA: Diagnosis not present

## 2014-07-30 DIAGNOSIS — R2681 Unsteadiness on feet: Secondary | ICD-10-CM | POA: Diagnosis not present

## 2014-07-30 DIAGNOSIS — M6281 Muscle weakness (generalized): Secondary | ICD-10-CM | POA: Diagnosis not present

## 2014-08-03 DIAGNOSIS — R2681 Unsteadiness on feet: Secondary | ICD-10-CM | POA: Diagnosis not present

## 2014-08-03 DIAGNOSIS — M545 Low back pain: Secondary | ICD-10-CM | POA: Diagnosis not present

## 2014-08-03 DIAGNOSIS — M6281 Muscle weakness (generalized): Secondary | ICD-10-CM | POA: Diagnosis not present

## 2014-08-03 DIAGNOSIS — R262 Difficulty in walking, not elsewhere classified: Secondary | ICD-10-CM | POA: Diagnosis not present

## 2014-08-04 DIAGNOSIS — R2681 Unsteadiness on feet: Secondary | ICD-10-CM | POA: Diagnosis not present

## 2014-08-04 DIAGNOSIS — M6281 Muscle weakness (generalized): Secondary | ICD-10-CM | POA: Diagnosis not present

## 2014-08-04 DIAGNOSIS — R262 Difficulty in walking, not elsewhere classified: Secondary | ICD-10-CM | POA: Diagnosis not present

## 2014-08-04 DIAGNOSIS — M545 Low back pain: Secondary | ICD-10-CM | POA: Diagnosis not present

## 2014-08-06 DIAGNOSIS — M6281 Muscle weakness (generalized): Secondary | ICD-10-CM | POA: Diagnosis not present

## 2014-08-06 DIAGNOSIS — R2681 Unsteadiness on feet: Secondary | ICD-10-CM | POA: Diagnosis not present

## 2014-08-06 DIAGNOSIS — M545 Low back pain: Secondary | ICD-10-CM | POA: Diagnosis not present

## 2014-08-06 DIAGNOSIS — R262 Difficulty in walking, not elsewhere classified: Secondary | ICD-10-CM | POA: Diagnosis not present

## 2014-08-09 DIAGNOSIS — M6281 Muscle weakness (generalized): Secondary | ICD-10-CM | POA: Diagnosis not present

## 2014-08-09 DIAGNOSIS — M545 Low back pain: Secondary | ICD-10-CM | POA: Diagnosis not present

## 2014-08-09 DIAGNOSIS — R262 Difficulty in walking, not elsewhere classified: Secondary | ICD-10-CM | POA: Diagnosis not present

## 2014-08-09 DIAGNOSIS — R2681 Unsteadiness on feet: Secondary | ICD-10-CM | POA: Diagnosis not present

## 2014-08-11 DIAGNOSIS — R262 Difficulty in walking, not elsewhere classified: Secondary | ICD-10-CM | POA: Diagnosis not present

## 2014-08-11 DIAGNOSIS — M6281 Muscle weakness (generalized): Secondary | ICD-10-CM | POA: Diagnosis not present

## 2014-08-11 DIAGNOSIS — R2681 Unsteadiness on feet: Secondary | ICD-10-CM | POA: Diagnosis not present

## 2014-08-11 DIAGNOSIS — M545 Low back pain: Secondary | ICD-10-CM | POA: Diagnosis not present

## 2014-08-16 DIAGNOSIS — M545 Low back pain: Secondary | ICD-10-CM | POA: Diagnosis not present

## 2014-08-16 DIAGNOSIS — S8001XD Contusion of right knee, subsequent encounter: Secondary | ICD-10-CM | POA: Diagnosis not present

## 2014-08-16 DIAGNOSIS — R2681 Unsteadiness on feet: Secondary | ICD-10-CM | POA: Diagnosis not present

## 2014-08-16 DIAGNOSIS — M6281 Muscle weakness (generalized): Secondary | ICD-10-CM | POA: Diagnosis not present

## 2014-08-16 DIAGNOSIS — R262 Difficulty in walking, not elsewhere classified: Secondary | ICD-10-CM | POA: Diagnosis not present

## 2014-08-18 DIAGNOSIS — M545 Low back pain: Secondary | ICD-10-CM | POA: Diagnosis not present

## 2014-08-18 DIAGNOSIS — R2681 Unsteadiness on feet: Secondary | ICD-10-CM | POA: Diagnosis not present

## 2014-08-18 DIAGNOSIS — R262 Difficulty in walking, not elsewhere classified: Secondary | ICD-10-CM | POA: Diagnosis not present

## 2014-08-18 DIAGNOSIS — M6281 Muscle weakness (generalized): Secondary | ICD-10-CM | POA: Diagnosis not present

## 2014-08-20 DIAGNOSIS — M6281 Muscle weakness (generalized): Secondary | ICD-10-CM | POA: Diagnosis not present

## 2014-08-20 DIAGNOSIS — M545 Low back pain: Secondary | ICD-10-CM | POA: Diagnosis not present

## 2014-08-20 DIAGNOSIS — R2681 Unsteadiness on feet: Secondary | ICD-10-CM | POA: Diagnosis not present

## 2014-08-20 DIAGNOSIS — R262 Difficulty in walking, not elsewhere classified: Secondary | ICD-10-CM | POA: Diagnosis not present

## 2014-08-23 DIAGNOSIS — M6281 Muscle weakness (generalized): Secondary | ICD-10-CM | POA: Diagnosis not present

## 2014-08-23 DIAGNOSIS — R2681 Unsteadiness on feet: Secondary | ICD-10-CM | POA: Diagnosis not present

## 2014-08-23 DIAGNOSIS — M545 Low back pain: Secondary | ICD-10-CM | POA: Diagnosis not present

## 2014-08-23 DIAGNOSIS — R262 Difficulty in walking, not elsewhere classified: Secondary | ICD-10-CM | POA: Diagnosis not present

## 2014-08-25 DIAGNOSIS — R2681 Unsteadiness on feet: Secondary | ICD-10-CM | POA: Diagnosis not present

## 2014-08-25 DIAGNOSIS — M545 Low back pain: Secondary | ICD-10-CM | POA: Diagnosis not present

## 2014-08-25 DIAGNOSIS — M6281 Muscle weakness (generalized): Secondary | ICD-10-CM | POA: Diagnosis not present

## 2014-08-25 DIAGNOSIS — R262 Difficulty in walking, not elsewhere classified: Secondary | ICD-10-CM | POA: Diagnosis not present

## 2014-08-27 DIAGNOSIS — R262 Difficulty in walking, not elsewhere classified: Secondary | ICD-10-CM | POA: Diagnosis not present

## 2014-08-27 DIAGNOSIS — R2681 Unsteadiness on feet: Secondary | ICD-10-CM | POA: Diagnosis not present

## 2014-08-27 DIAGNOSIS — M545 Low back pain: Secondary | ICD-10-CM | POA: Diagnosis not present

## 2014-08-27 DIAGNOSIS — M6281 Muscle weakness (generalized): Secondary | ICD-10-CM | POA: Diagnosis not present

## 2014-10-14 DIAGNOSIS — M5137 Other intervertebral disc degeneration, lumbosacral region: Secondary | ICD-10-CM | POA: Diagnosis not present

## 2014-10-14 DIAGNOSIS — Z6838 Body mass index (BMI) 38.0-38.9, adult: Secondary | ICD-10-CM | POA: Diagnosis not present

## 2014-11-09 DIAGNOSIS — E782 Mixed hyperlipidemia: Secondary | ICD-10-CM | POA: Diagnosis not present

## 2014-11-09 DIAGNOSIS — Z0000 Encounter for general adult medical examination without abnormal findings: Secondary | ICD-10-CM | POA: Diagnosis not present

## 2014-11-09 DIAGNOSIS — E039 Hypothyroidism, unspecified: Secondary | ICD-10-CM | POA: Diagnosis not present

## 2014-11-09 DIAGNOSIS — N951 Menopausal and female climacteric states: Secondary | ICD-10-CM | POA: Diagnosis not present

## 2014-11-09 DIAGNOSIS — N3000 Acute cystitis without hematuria: Secondary | ICD-10-CM | POA: Diagnosis not present

## 2014-11-09 DIAGNOSIS — M4806 Spinal stenosis, lumbar region: Secondary | ICD-10-CM | POA: Diagnosis not present

## 2014-11-09 DIAGNOSIS — M545 Low back pain: Secondary | ICD-10-CM | POA: Diagnosis not present

## 2014-11-09 DIAGNOSIS — I10 Essential (primary) hypertension: Secondary | ICD-10-CM | POA: Diagnosis not present

## 2014-11-09 DIAGNOSIS — B86 Scabies: Secondary | ICD-10-CM | POA: Diagnosis not present

## 2014-11-09 DIAGNOSIS — Z23 Encounter for immunization: Secondary | ICD-10-CM | POA: Diagnosis not present

## 2014-11-30 ENCOUNTER — Ambulatory Visit: Payer: Medicare Other

## 2014-12-06 ENCOUNTER — Ambulatory Visit: Payer: Medicare Other

## 2014-12-08 ENCOUNTER — Ambulatory Visit
Admission: RE | Admit: 2014-12-08 | Discharge: 2014-12-08 | Disposition: A | Discharge status: Home / Self Care | Payer: Medicare Other | Source: Ambulatory Visit

## 2014-12-08 DIAGNOSIS — Z1231 Encounter for screening mammogram for malignant neoplasm of breast: Secondary | ICD-10-CM

## 2015-01-04 DIAGNOSIS — J45909 Unspecified asthma, uncomplicated: Secondary | ICD-10-CM | POA: Diagnosis not present

## 2015-01-04 DIAGNOSIS — R05 Cough: Secondary | ICD-10-CM | POA: Diagnosis not present

## 2015-02-04 ENCOUNTER — Emergency Department (HOSPITAL_COMMUNITY): Payer: Medicare Other

## 2015-02-04 ENCOUNTER — Encounter (HOSPITAL_COMMUNITY): Payer: Self-pay

## 2015-02-04 ENCOUNTER — Emergency Department (HOSPITAL_COMMUNITY)
Admission: EM | Admit: 2015-02-04 | Discharge: 2015-02-04 | Disposition: A | Discharge status: Home / Self Care | Payer: Medicare Other | Attending: Emergency Medicine | Admitting: Emergency Medicine

## 2015-02-04 DIAGNOSIS — W1839XA Other fall on same level, initial encounter: Secondary | ICD-10-CM | POA: Diagnosis not present

## 2015-02-04 DIAGNOSIS — Z87891 Personal history of nicotine dependence: Secondary | ICD-10-CM | POA: Diagnosis not present

## 2015-02-04 DIAGNOSIS — Z7982 Long term (current) use of aspirin: Secondary | ICD-10-CM | POA: Insufficient documentation

## 2015-02-04 DIAGNOSIS — Z872 Personal history of diseases of the skin and subcutaneous tissue: Secondary | ICD-10-CM | POA: Diagnosis not present

## 2015-02-04 DIAGNOSIS — I10 Essential (primary) hypertension: Secondary | ICD-10-CM | POA: Diagnosis not present

## 2015-02-04 DIAGNOSIS — Y92009 Unspecified place in unspecified non-institutional (private) residence as the place of occurrence of the external cause: Secondary | ICD-10-CM | POA: Insufficient documentation

## 2015-02-04 DIAGNOSIS — G8929 Other chronic pain: Secondary | ICD-10-CM | POA: Insufficient documentation

## 2015-02-04 DIAGNOSIS — Z79899 Other long term (current) drug therapy: Secondary | ICD-10-CM | POA: Insufficient documentation

## 2015-02-04 DIAGNOSIS — Y998 Other external cause status: Secondary | ICD-10-CM | POA: Insufficient documentation

## 2015-02-04 DIAGNOSIS — R51 Headache: Secondary | ICD-10-CM | POA: Diagnosis not present

## 2015-02-04 DIAGNOSIS — E039 Hypothyroidism, unspecified: Secondary | ICD-10-CM | POA: Diagnosis not present

## 2015-02-04 DIAGNOSIS — S0990XA Unspecified injury of head, initial encounter: Secondary | ICD-10-CM | POA: Insufficient documentation

## 2015-02-04 DIAGNOSIS — Y9389 Activity, other specified: Secondary | ICD-10-CM | POA: Diagnosis not present

## 2015-02-04 NOTE — Discharge Instructions (Signed)

## 2015-02-04 NOTE — ED Notes (Signed)
Pt here from home after a fall. She fell into the door frame walking to the restroom. She has hematoma noted to the back of her head. Pt is a&o X4, no LOC. Pt denies use of blood thinners.

## 2015-02-04 NOTE — ED Provider Notes (Signed)
CSN: [id number]     Arrival date & time 02/04/15  0741 History   First MD Initiated Contact with Patient 02/04/15 [phone]     Chief Complaint  Patient presents with  . Fall  . Head Injury    HPI Patient presents to the emergency room with complaints of a head injury. Patient has history of prior cervical and lumbar spinal surgery. She has had some episodes of intermittent pain in her leg as well as the feeling of giving away. This has been going off and on for a few months and she has seen an orthopedic doctor. When she got up this morning, she felt her leg gave way while she was walking and she fell into a door frame. Patient struck the back of her head. She developed a large hematoma on this concerned her. She denies loss of consciousness. No nausea or vomiting. She denies any pain in her neck back or extremities. She denies any numbness or weakness. Past Medical History  Diagnosis Date  . Hypertension   . Thyroid condition   . Chronic back pain   . Cervical spinal stenosis   . Hypothyroidism   . Frequent UTI   . Dry skin   . Eczema   . History of blood in urine     Pt was seen by [facility]; scant amount; no current issues  . Fatty tumor     Right wrist  . Numbness and tingling in hands     Bilateral   Past Surgical History  Procedure Laterality Date  . Thyroidectomy  1971  . Total abdominal hysterectomy  1986  . Cervical spine surgery  01/28/2009  . Breast biopsy Right 2006    benign  . Colonoscopy w/ polypectomy    . Tonsillectomy    . Appendectomy     Family History  Problem Relation Age of Onset  . Heart disease Mother   . Kidney failure Father    Social History  Substance Use Topics  . Smoking status: Former Research scientist (life sciences)  . Smokeless tobacco: Never Used  . Alcohol Use: Yes     Comment: quit in 1997; wine occasionally   OB History    No data available     Review of Systems  All other systems reviewed and are negative.     Allergies  Review of patient's  allergies indicates no known allergies.  Home Medications   Prior to Admission medications   Medication Sig Start Date End Date Taking? Authorizing Provider  amLODipine (NORVASC) 5 MG tablet 5 mg. 11/12/14  Yes Historical Provider, MD  aspirin EC 81 MG tablet Take 81 mg by mouth daily.   Yes Historical Provider, MD  colesevelam (WELCHOL) 625 MG tablet Take 1,250 mg by mouth 2 (two) times daily with a meal.   Yes Historical Provider, MD  docusate sodium (COLACE) 100 MG capsule Take 100 mg by mouth daily as needed for mild constipation.    Yes Historical Provider, MD  gemfibrozil (LOPID) 600 MG tablet Take 600 mg by mouth daily.   Yes Historical Provider, MD  ibuprofen (ADVIL,MOTRIN) 800 MG tablet 800 mg. 12/21/14  Yes Historical Provider, MD  levothyroxine (SYNTHROID, LEVOTHROID) 100 MCG tablet Take 100 mcg by mouth daily before breakfast.   Yes Historical Provider, MD  metoprolol succinate (TOPROL-XL) 100 MG 24 hr tablet Take 100 mg by mouth daily.  05/03/14  Yes Historical Provider, MD  Multiple Vitamins-Minerals (MULTIVITAMIN PO) Take 1 tablet by mouth daily.    Yes  Historical Provider, MD  quinapril-hydrochlorothiazide (ACCURETIC) 20-25 MG per tablet Take 1 tablet by mouth daily.   Yes Historical Provider, MD  raloxifene (EVISTA) 60 MG tablet Take 60 mg by mouth daily.   Yes Historical Provider, MD  ranitidine (ZANTAC) 300 MG capsule Take 300 mg by mouth daily as needed for heartburn.    Yes Historical Provider, MD  triamcinolone cream (KENALOG) 0.5 % Apply 1 application topically daily as needed (dry skin).  05/03/14  Yes Historical Provider, MD  cyclobenzaprine (FLEXERIL) 10 MG tablet Take 1 tablet (10 mg total) by mouth 2 (two) times daily as needed for muscle spasms. Patient not taking: Reported on 02/04/2015 07/11/14   Baron Sane, PA-C  HYDROcodone-acetaminophen (NORCO/VICODIN) 5-325 MG per tablet Take 1-2 tablets by mouth every 6 (six) hours as needed for severe pain. Patient not  taking: Reported on 02/04/2015 07/11/14   Anderson Malta Piepenbrink, PA-C   BP 154/85 mmHg  Pulse 80  Temp(Src) 97.9 F (36.6 C) (Oral)  Resp 16  SpO2 96% Physical Exam  Constitutional: She appears well-developed and well-nourished. No distress.  HENT:  Head: Normocephalic.  Right Ear: External ear normal.  Left Ear: External ear normal.  Tenderness palpation posterior occiput, hematoma  Eyes: Conjunctivae are normal. Right eye exhibits no discharge. Left eye exhibits no discharge. No scleral icterus.  Neck: Neck supple. No tracheal deviation present.  Cardiovascular: Normal rate, regular rhythm and intact distal pulses.   Pulmonary/Chest: Effort normal and breath sounds normal. No stridor. No respiratory distress. She has no wheezes. She has no rales.  Abdominal: Soft. Bowel sounds are normal. She exhibits no distension. There is no tenderness. There is no rebound and no guarding.  Musculoskeletal: She exhibits no edema or tenderness.       Cervical back: Normal.       Thoracic back: Normal.       Lumbar back: Normal.  Neurological: She is alert. She has normal strength. No cranial nerve deficit (no facial droop, extraocular movements intact, no slurred speech) or sensory deficit. She exhibits normal muscle tone. She displays no seizure activity. Coordination normal.  Skin: Skin is warm and dry. No rash noted. She is not diaphoretic.  Psychiatric: She has a normal mood and affect.  Nursing note and vitals reviewed.   ED Course  Procedures   Imaging Review Ct Head Wo Contrast  02/04/2015  CLINICAL DATA:  Pain following fall. EXAM: CT HEAD WITHOUT CONTRAST TECHNIQUE: Contiguous axial images were obtained from the base of the skull through the vertex without intravenous contrast. COMPARISON:  July 10, 2014 FINDINGS: The ventricles are normal in size and configuration. There is no intracranial mass, hemorrhage, extra-axial fluid collection, or midline shift. There is slight small vessel  disease in the centra semiovale bilaterally. Elsewhere gray-white compartments appear normal. No acute infarct evident. There is a right parietal scalp hematoma. The bony calvarium appears intact. The mastoid air cells are clear. No intraorbital lesions are appreciable. There is mucosal thickening in the left sphenoid and right maxillary antral regions. IMPRESSION: Mild periventricular small vessel disease. No intracranial mass, hemorrhage, or extra-axial fluid collection. No acute infarct. Areas of paranasal sinus disease. Electronically Signed   By: Lowella Grip III M.D.   On: 02/04/2015 08:29   I have personally reviewed and evaluated these images and lab results as part of my medical decision-making.    MDM   Final diagnoses:  Minor head injury, initial encounter    No syncope. Doubt TIA stroke. No weakness on  exam. No evidence of serious injury associated with the fall.  Consistent with soft tissue injury.  Explained findings to patient and warning signs that should prompt return to the ED.    Dorie Rank, MD 02/04/15 [phone]

## 2015-02-17 ENCOUNTER — Other Ambulatory Visit: Payer: Self-pay | Admitting: Gastroenterology

## 2015-02-17 DIAGNOSIS — Z86010 Personal history of colonic polyps: Secondary | ICD-10-CM | POA: Diagnosis not present

## 2015-02-17 DIAGNOSIS — K621 Rectal polyp: Secondary | ICD-10-CM | POA: Diagnosis not present

## 2015-02-17 DIAGNOSIS — D122 Benign neoplasm of ascending colon: Secondary | ICD-10-CM | POA: Diagnosis not present

## 2015-05-10 DIAGNOSIS — M545 Low back pain: Secondary | ICD-10-CM | POA: Diagnosis not present

## 2015-05-10 DIAGNOSIS — I10 Essential (primary) hypertension: Secondary | ICD-10-CM | POA: Diagnosis not present

## 2015-05-10 DIAGNOSIS — E782 Mixed hyperlipidemia: Secondary | ICD-10-CM | POA: Diagnosis not present

## 2015-05-26 DIAGNOSIS — I10 Essential (primary) hypertension: Secondary | ICD-10-CM | POA: Diagnosis not present

## 2015-05-26 DIAGNOSIS — E782 Mixed hyperlipidemia: Secondary | ICD-10-CM | POA: Diagnosis not present

## 2015-08-24 DIAGNOSIS — M940 Chondrocostal junction syndrome [Tietze]: Secondary | ICD-10-CM | POA: Diagnosis not present

## 2015-08-24 DIAGNOSIS — M79621 Pain in right upper arm: Secondary | ICD-10-CM | POA: Diagnosis not present

## 2015-08-25 DIAGNOSIS — H31092 Other chorioretinal scars, left eye: Secondary | ICD-10-CM | POA: Diagnosis not present

## 2015-10-11 DIAGNOSIS — L309 Dermatitis, unspecified: Secondary | ICD-10-CM | POA: Diagnosis not present

## 2015-10-11 DIAGNOSIS — Z23 Encounter for immunization: Secondary | ICD-10-CM | POA: Diagnosis not present

## 2015-11-08 DIAGNOSIS — L309 Dermatitis, unspecified: Secondary | ICD-10-CM | POA: Diagnosis not present

## 2015-11-08 DIAGNOSIS — Z23 Encounter for immunization: Secondary | ICD-10-CM | POA: Diagnosis not present

## 2015-12-20 DIAGNOSIS — Z1389 Encounter for screening for other disorder: Secondary | ICD-10-CM | POA: Diagnosis not present

## 2015-12-20 DIAGNOSIS — E782 Mixed hyperlipidemia: Secondary | ICD-10-CM | POA: Diagnosis not present

## 2015-12-20 DIAGNOSIS — M545 Low back pain: Secondary | ICD-10-CM | POA: Diagnosis not present

## 2015-12-20 DIAGNOSIS — Z6839 Body mass index (BMI) 39.0-39.9, adult: Secondary | ICD-10-CM | POA: Diagnosis not present

## 2015-12-20 DIAGNOSIS — Z0000 Encounter for general adult medical examination without abnormal findings: Secondary | ICD-10-CM | POA: Diagnosis not present

## 2015-12-20 DIAGNOSIS — M8588 Other specified disorders of bone density and structure, other site: Secondary | ICD-10-CM | POA: Diagnosis not present

## 2015-12-20 DIAGNOSIS — Z23 Encounter for immunization: Secondary | ICD-10-CM | POA: Diagnosis not present

## 2015-12-20 DIAGNOSIS — I10 Essential (primary) hypertension: Secondary | ICD-10-CM | POA: Diagnosis not present

## 2016-01-11 DIAGNOSIS — I10 Essential (primary) hypertension: Secondary | ICD-10-CM | POA: Diagnosis not present

## 2016-01-11 DIAGNOSIS — E782 Mixed hyperlipidemia: Secondary | ICD-10-CM | POA: Diagnosis not present

## 2016-01-17 DIAGNOSIS — H25013 Cortical age-related cataract, bilateral: Secondary | ICD-10-CM | POA: Diagnosis not present

## 2016-01-17 DIAGNOSIS — H25043 Posterior subcapsular polar age-related cataract, bilateral: Secondary | ICD-10-CM | POA: Diagnosis not present

## 2016-01-17 DIAGNOSIS — H2513 Age-related nuclear cataract, bilateral: Secondary | ICD-10-CM | POA: Diagnosis not present

## 2016-01-17 DIAGNOSIS — H18413 Arcus senilis, bilateral: Secondary | ICD-10-CM | POA: Diagnosis not present

## 2016-01-17 DIAGNOSIS — H2511 Age-related nuclear cataract, right eye: Secondary | ICD-10-CM | POA: Diagnosis not present

## 2016-01-25 ENCOUNTER — Other Ambulatory Visit: Payer: Self-pay | Admitting: Family Medicine

## 2016-01-25 DIAGNOSIS — Z1231 Encounter for screening mammogram for malignant neoplasm of breast: Secondary | ICD-10-CM

## 2016-02-06 DIAGNOSIS — M7021 Olecranon bursitis, right elbow: Secondary | ICD-10-CM | POA: Diagnosis not present

## 2016-02-17 DIAGNOSIS — H2512 Age-related nuclear cataract, left eye: Secondary | ICD-10-CM | POA: Diagnosis not present

## 2016-02-17 DIAGNOSIS — H25811 Combined forms of age-related cataract, right eye: Secondary | ICD-10-CM | POA: Diagnosis not present

## 2016-02-17 DIAGNOSIS — H2511 Age-related nuclear cataract, right eye: Secondary | ICD-10-CM | POA: Diagnosis not present

## 2016-03-01 ENCOUNTER — Ambulatory Visit: Payer: Medicare Other

## 2016-03-02 DIAGNOSIS — H2512 Age-related nuclear cataract, left eye: Secondary | ICD-10-CM | POA: Diagnosis not present

## 2016-03-02 DIAGNOSIS — H25812 Combined forms of age-related cataract, left eye: Secondary | ICD-10-CM | POA: Diagnosis not present

## 2016-03-14 ENCOUNTER — Ambulatory Visit
Admission: RE | Admit: 2016-03-14 | Discharge: 2016-03-14 | Disposition: A | Discharge status: Home / Self Care | Payer: Medicare Other | Source: Ambulatory Visit | Attending: Family Medicine | Admitting: Family Medicine

## 2016-03-14 DIAGNOSIS — Z1231 Encounter for screening mammogram for malignant neoplasm of breast: Secondary | ICD-10-CM | POA: Diagnosis not present

## 2016-04-24 DIAGNOSIS — L309 Dermatitis, unspecified: Secondary | ICD-10-CM | POA: Diagnosis not present

## 2016-08-08 DIAGNOSIS — R600 Localized edema: Secondary | ICD-10-CM | POA: Diagnosis not present

## 2016-08-08 DIAGNOSIS — Z1389 Encounter for screening for other disorder: Secondary | ICD-10-CM | POA: Diagnosis not present

## 2016-08-08 DIAGNOSIS — E782 Mixed hyperlipidemia: Secondary | ICD-10-CM | POA: Diagnosis not present

## 2016-08-08 DIAGNOSIS — I10 Essential (primary) hypertension: Secondary | ICD-10-CM | POA: Diagnosis not present

## 2016-10-30 IMAGING — CT CT HEAD W/O CM
3 of 6 series · 14 of 47 positions shown, 16 images · non-contrast
Comparison: Cervical spine radiographs performed earlier today at
[DATE] p.m., MRI of the cervical spine performed 01/20/2009, and MRI
of the brain from 11/29/2008

CLINICAL DATA: Status post fall. Landed on left side, with severe
left shoulder and neck pain. Concern for head injury. Initial
encounter.

EXAM:
CT HEAD WITHOUT CONTRAST
CT CERVICAL SPINE WITHOUT CONTRAST
TECHNIQUE: Multidetector CT imaging of the head and cervical spine was
performed following the standard protocol without intravenous
contrast. Multiplanar CT image reconstructions of the cervical spine
were also generated.

[Series 302: soft tissue, idose (2) · axial · 0.39mm/px · z∈[+78,+216]mm · 8 of 89 slices shown, 10 images]
[im 10/89  brain]
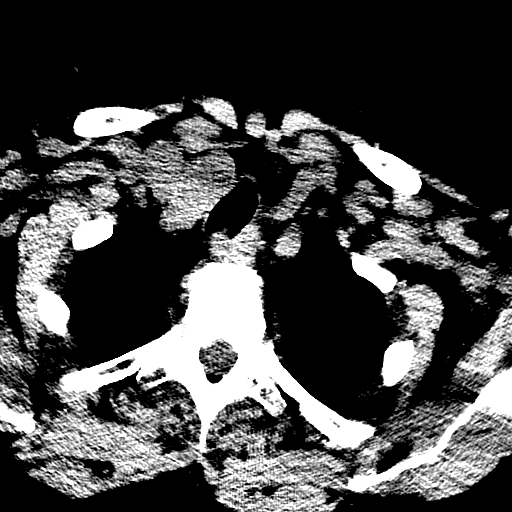
[im 10/89  bone]
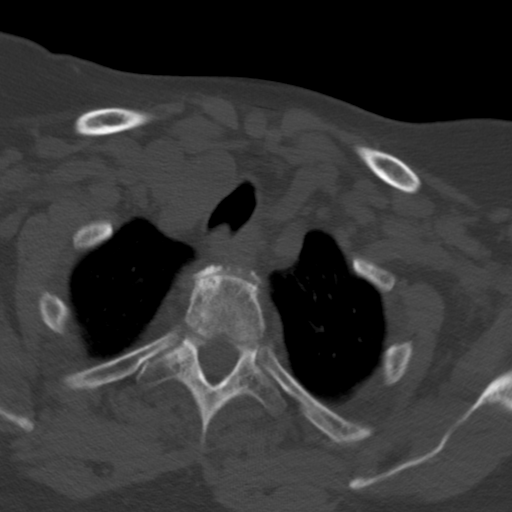
[im 20/89  brain]
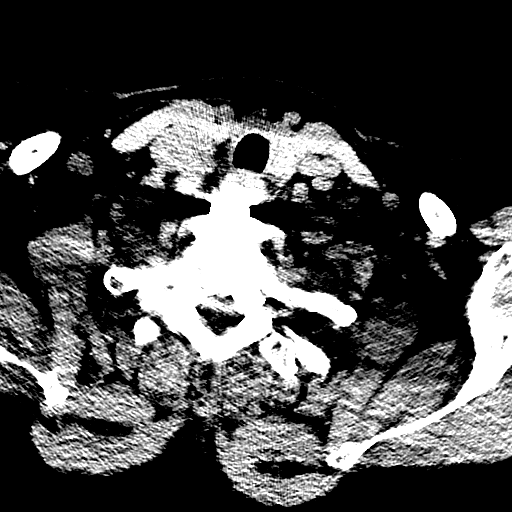
[im 30/89  brain]
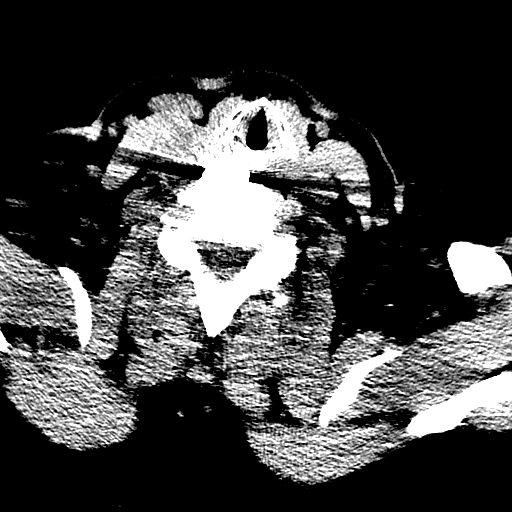
[im 40/89  brain]
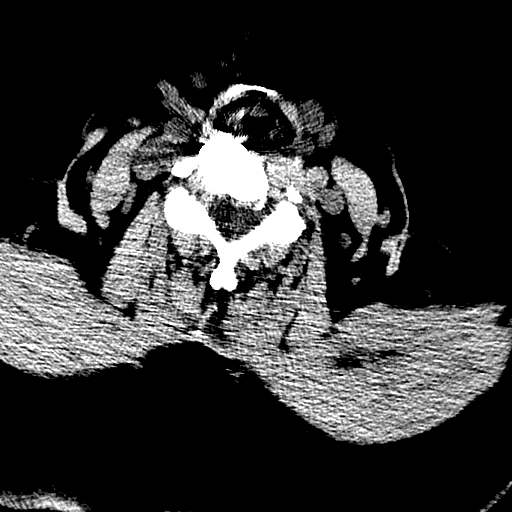
[im 49/89  brain]
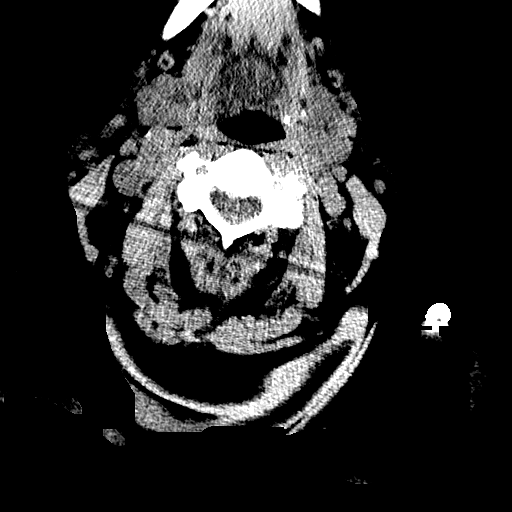
[im 49/89  bone]
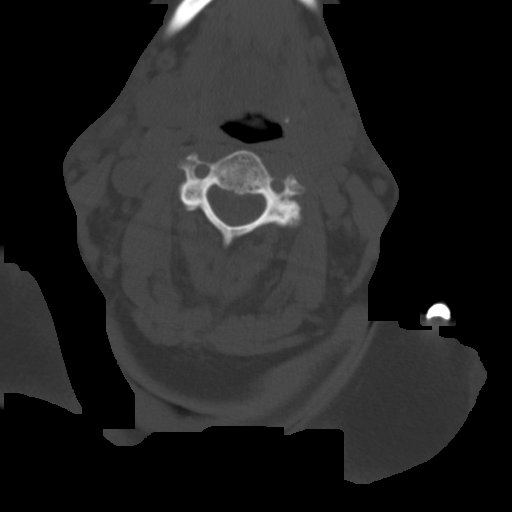
[im 59/89  brain]
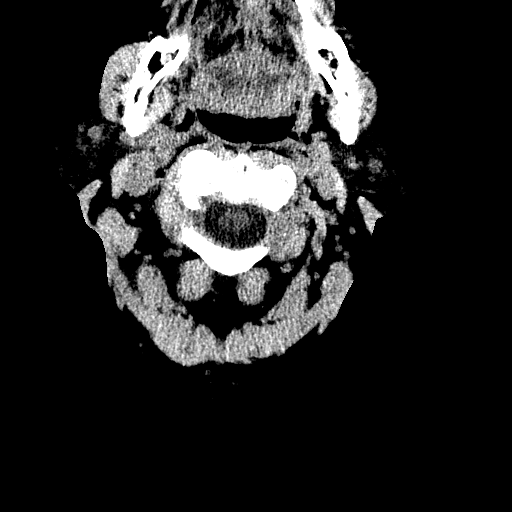
[im 69/89  brain]
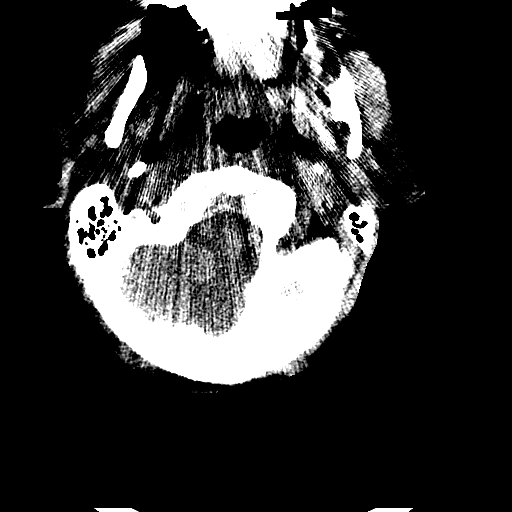
[im 79/89  brain]
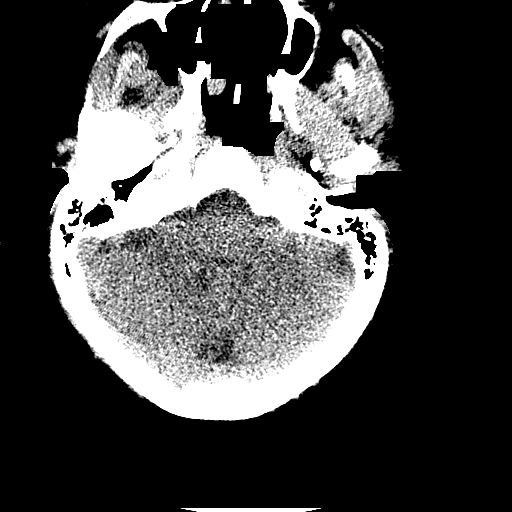

[Series 304: coronal, idose (2) · coronal · 0.35mm/px · 3 of 66 slices shown]
[im 22/66  brain]
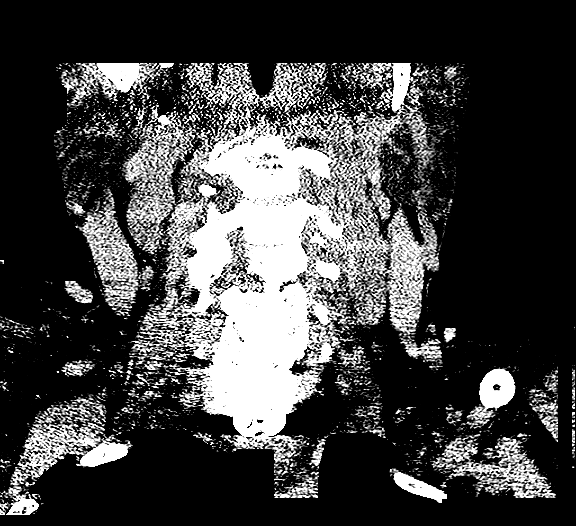
[im 29/66  brain]
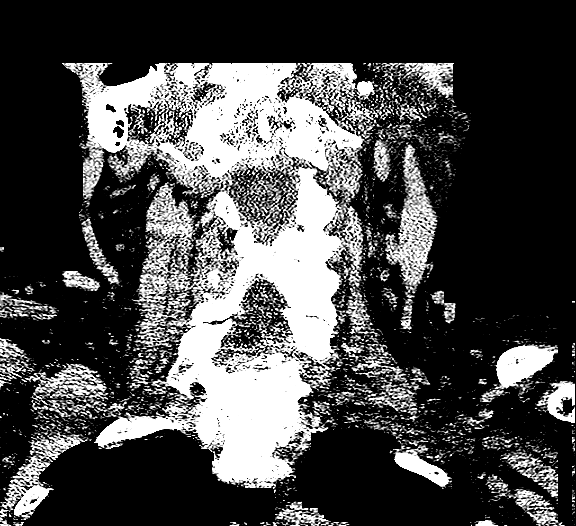
[im 37/66  brain]
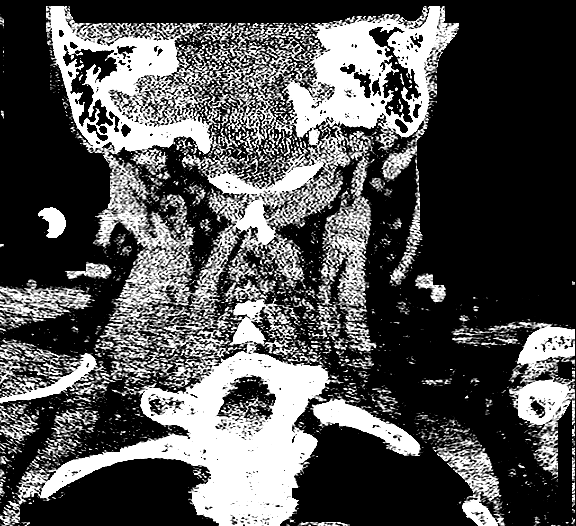

[Series 305: sagittal, idose (2) · sagittal · 0.34mm/px · 3 of 56 slices shown]
[im 19/56  brain]
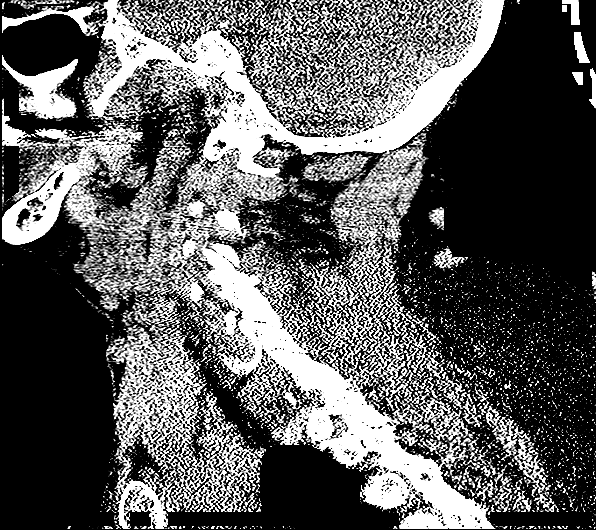
[im 28/56  brain]
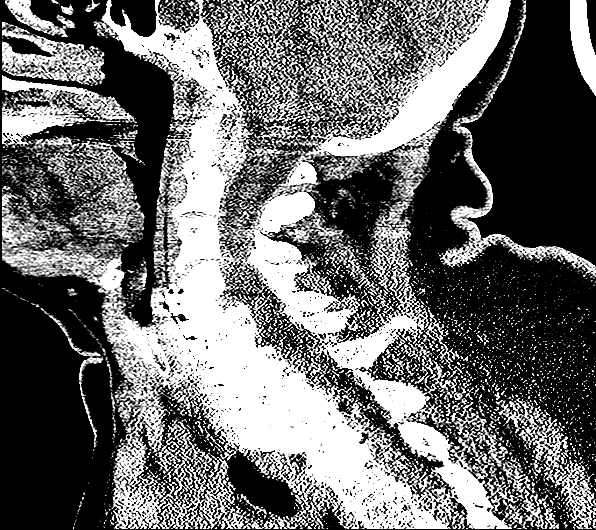
[im 37/56  brain]
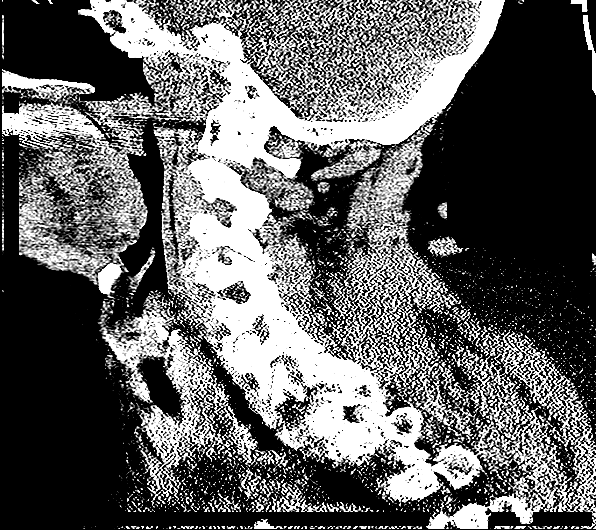

[14 of 47 positions shown; findings below may reference images not displayed]

FINDINGS: CT HEAD FINDINGS

There is no evidence of acute infarction, mass lesion, or intra- or
extra-axial hemorrhage on CT.

Mild periventricular white matter change likely reflects small
vessel ischemic microangiopathy.

The posterior fossa, including the cerebellum, brainstem and fourth
ventricle, is within normal limits. The third and lateral
ventricles, and basal ganglia are unremarkable in appearance. The
cerebral hemispheres demonstrate grossly normal gray-white
differentiation. No mass effect or midline shift is seen.

There is no evidence of fracture; visualized osseous structures are
unremarkable in appearance. The visualized portions of the orbits
are within normal limits. The paranasal sinuses and mastoid air
cells are well-aerated. No significant soft tissue abnormalities are
seen.

CT CERVICAL SPINE FINDINGS

There is no evidence of fracture or subluxation. The patient is
status post anterior cervical spinal fusion at C4-C7. Underlying
degenerative change is noted, with mild grade 1 anterolisthesis of
C7 on T1. Disc space narrowing is noted at T1-T2. Vertebral bodies
demonstrate normal height and alignment. Prevertebral soft tissues
are within normal limits.

The thyroid gland is unremarkable in appearance. The visualized lung
apices are clear. There is diffuse prominence of collateral
vasculature about the neck, of uncertain significance.
IMPRESSION: 1. No evidence of traumatic intracranial injury or fracture.
2. No evidence of fracture or subluxation along the cervical spine.
3. Mild small vessel ischemic microangiopathy noted.
4. Status post anterior cervical spinal fusion at C4-C7, with
underlying degenerative change.
5. Diffuse prominence of collateral vasculature about the neck, of
uncertain significance.

## 2017-01-09 DIAGNOSIS — I10 Essential (primary) hypertension: Secondary | ICD-10-CM | POA: Diagnosis not present

## 2017-01-09 DIAGNOSIS — M549 Dorsalgia, unspecified: Secondary | ICD-10-CM | POA: Diagnosis not present

## 2017-01-09 DIAGNOSIS — Z1389 Encounter for screening for other disorder: Secondary | ICD-10-CM | POA: Diagnosis not present

## 2017-01-09 DIAGNOSIS — Z6841 Body Mass Index (BMI) 40.0 and over, adult: Secondary | ICD-10-CM | POA: Diagnosis not present

## 2017-01-09 DIAGNOSIS — R7301 Impaired fasting glucose: Secondary | ICD-10-CM | POA: Diagnosis not present

## 2017-01-09 DIAGNOSIS — M8588 Other specified disorders of bone density and structure, other site: Secondary | ICD-10-CM | POA: Diagnosis not present

## 2017-01-09 DIAGNOSIS — Z0000 Encounter for general adult medical examination without abnormal findings: Secondary | ICD-10-CM | POA: Diagnosis not present

## 2017-01-09 DIAGNOSIS — R600 Localized edema: Secondary | ICD-10-CM | POA: Diagnosis not present

## 2017-01-09 DIAGNOSIS — E782 Mixed hyperlipidemia: Secondary | ICD-10-CM | POA: Diagnosis not present

## 2017-01-09 DIAGNOSIS — L989 Disorder of the skin and subcutaneous tissue, unspecified: Secondary | ICD-10-CM | POA: Diagnosis not present

## 2017-01-09 DIAGNOSIS — E039 Hypothyroidism, unspecified: Secondary | ICD-10-CM | POA: Diagnosis not present

## 2017-01-15 HISTORY — PX: EYE SURGERY: SHX253

## 2017-02-21 DIAGNOSIS — L820 Inflamed seborrheic keratosis: Secondary | ICD-10-CM | POA: Diagnosis not present

## 2017-03-21 DIAGNOSIS — M8588 Other specified disorders of bone density and structure, other site: Secondary | ICD-10-CM | POA: Diagnosis not present

## 2017-04-15 DIAGNOSIS — M5137 Other intervertebral disc degeneration, lumbosacral region: Secondary | ICD-10-CM | POA: Diagnosis not present

## 2017-04-15 DIAGNOSIS — M4722 Other spondylosis with radiculopathy, cervical region: Secondary | ICD-10-CM | POA: Diagnosis not present

## 2017-04-15 DIAGNOSIS — M549 Dorsalgia, unspecified: Secondary | ICD-10-CM | POA: Diagnosis not present

## 2017-04-19 DIAGNOSIS — M5137 Other intervertebral disc degeneration, lumbosacral region: Secondary | ICD-10-CM | POA: Diagnosis not present

## 2017-04-23 DIAGNOSIS — M5137 Other intervertebral disc degeneration, lumbosacral region: Secondary | ICD-10-CM | POA: Diagnosis not present

## 2017-04-23 DIAGNOSIS — M48061 Spinal stenosis, lumbar region without neurogenic claudication: Secondary | ICD-10-CM | POA: Diagnosis not present

## 2017-05-06 DIAGNOSIS — Z6841 Body Mass Index (BMI) 40.0 and over, adult: Secondary | ICD-10-CM | POA: Diagnosis not present

## 2017-05-06 DIAGNOSIS — I10 Essential (primary) hypertension: Secondary | ICD-10-CM | POA: Diagnosis not present

## 2017-05-06 DIAGNOSIS — M48062 Spinal stenosis, lumbar region with neurogenic claudication: Secondary | ICD-10-CM | POA: Diagnosis not present

## 2017-05-07 ENCOUNTER — Other Ambulatory Visit: Payer: Self-pay | Admitting: Neurosurgery

## 2017-05-08 NOTE — Pre-Procedure Instructions (Signed)
Ashley Mercer  05/08/2017    Your procedure is scheduled on Friday, May 10, 2017 at 7:30 AM.   Report to [facility] Entrance "A" Admitting Office at 5:30 AM.   Call this number if you have problems the morning of surgery: [phone]   Remember:  Do not eat food or drink liquids after midnight tonight.  Take these medicines the morning of surgery with A SIP OF WATER: Amlodipine (Norvasc), Duloxetine (Cymbalta), Levothyroxine (Synthroid), Metoprolol (Toprol XL), Ranitidine (Zantac) - if needed  Stop Aspirin as instructed by surgeon. Stop NSAIDS (Ibuprofen, Aleve, etc) as of today.   Do not wear jewelry, make-up or nail polish.  Do not wear lotions, powders, perfumes or deodorant.  Do not shave 48 hours prior to surgery.    Do not bring valuables to the hospital.  [facility] is not responsible for any belongings or valuables.  Contacts, dentures or bridgework may not be worn into surgery.  Leave your suitcase in the car.  After surgery it may be brought to your room.  For patients admitted to the hospital, discharge time will be determined by your treatment team.  Patients discharged the day of surgery will not be allowed to drive home.   [place] - Preparing for Surgery  Before surgery, you can play an important role.  Because skin is not sterile, your skin needs to be as free of germs as possible.  You can reduce the number of germs on you skin by washing with CHG (chlorahexidine gluconate) soap before surgery.  CHG is an antiseptic cleaner which kills germs and bonds with the skin to continue killing germs even after washing.  Please DO NOT use if you have an allergy to CHG or antibacterial soaps.  If your skin becomes reddened/irritated stop using the CHG and inform your nurse when you arrive at Short Stay.  Do not shave (including legs and underarms) for at least 48 hours prior to the first CHG shower.  You may shave your face.  Please follow these instructions  carefully:   1.  Shower with CHG Soap the night before surgery and the                    morning of Surgery.  2.  If you choose to wash your hair, wash your hair first as usual with your       normal shampoo.  3.  After you shampoo, rinse your hair and body thoroughly to remove the shampoo.  4.  Use CHG as you would any other liquid soap.  You can apply chg directly       to the skin and wash gently with scrungie or a clean washcloth.  5.  Apply the CHG Soap to your body ONLY FROM THE NECK DOWN.        Do not use on open wounds or open sores.  Avoid contact with your eyes, ears, mouth and genitals (private parts).  Wash genitals (private parts) with your normal soap.  6.  Wash thoroughly, paying special attention to the area where your surgery        will be performed.  7.  Thoroughly rinse your body with warm water from the neck down.  8.  DO NOT shower/wash with your normal soap after using and rinsing off       the CHG Soap.  9.  Pat yourself dry with a clean towel.  10.  Wear clean pajamas.            11.  Place clean sheets on your bed the night of your first shower and do not        sleep with pets.  Day of Surgery  Shower as above. Do not apply any lotions/deodorants the morning of surgery.  Please wear clean clothes to the hospital.  Please read over the fact sheets that you were given.

## 2017-05-09 ENCOUNTER — Other Ambulatory Visit: Payer: Self-pay

## 2017-05-09 ENCOUNTER — Other Ambulatory Visit (HOSPITAL_COMMUNITY): Payer: Self-pay | Admitting: *Deleted

## 2017-05-09 ENCOUNTER — Encounter (HOSPITAL_COMMUNITY)
Admission: RE | Admit: 2017-05-09 | Discharge: 2017-05-09 | Disposition: A | Discharge status: Home / Self Care | Payer: Medicare Other | Source: Ambulatory Visit | Attending: Neurosurgery | Admitting: Neurosurgery

## 2017-05-09 ENCOUNTER — Other Ambulatory Visit: Payer: Self-pay | Admitting: Neurosurgery

## 2017-05-09 ENCOUNTER — Encounter (HOSPITAL_COMMUNITY): Payer: Self-pay

## 2017-05-09 DIAGNOSIS — I10 Essential (primary) hypertension: Secondary | ICD-10-CM | POA: Diagnosis not present

## 2017-05-09 DIAGNOSIS — Z6841 Body Mass Index (BMI) 40.0 and over, adult: Secondary | ICD-10-CM | POA: Diagnosis not present

## 2017-05-09 DIAGNOSIS — E669 Obesity, unspecified: Secondary | ICD-10-CM | POA: Diagnosis not present

## 2017-05-09 DIAGNOSIS — M48062 Spinal stenosis, lumbar region with neurogenic claudication: Secondary | ICD-10-CM | POA: Diagnosis not present

## 2017-05-09 DIAGNOSIS — Z87891 Personal history of nicotine dependence: Secondary | ICD-10-CM | POA: Diagnosis not present

## 2017-05-09 HISTORY — DX: Pneumonia, unspecified organism: J18.9

## 2017-05-09 HISTORY — DX: Gastro-esophageal reflux disease without esophagitis: K21.9

## 2017-05-09 HISTORY — DX: Anemia, unspecified: D64.9

## 2017-05-09 HISTORY — DX: Unspecified osteoarthritis, unspecified site: M19.90

## 2017-05-09 LAB — CBC
HCT: 42.1 % (ref 36.0–46.0)
Hemoglobin: 13.6 g/dL (ref 12.0–15.0)
MCH: 27.5 pg (ref 26.0–34.0)
MCHC: 32.3 g/dL (ref 30.0–36.0)
MCV: 85.2 fL (ref 78.0–100.0)
Platelets: 358 10*3/uL (ref 150–400)
RBC: 4.94 MIL/uL (ref 3.87–5.11)
RDW: 15.1 % (ref 11.5–15.5)
WBC: 12.3 10*3/uL — ABNORMAL HIGH (ref 4.0–10.5)

## 2017-05-09 LAB — BASIC METABOLIC PANEL
Anion gap: 13 (ref 5–15)
BUN: 14 mg/dL (ref 6–20)
CO2: 27 mmol/L (ref 22–32)
Calcium: 9.5 mg/dL (ref 8.9–10.3)
Chloride: 101 mmol/L (ref 101–111)
Creatinine, Ser: 0.99 mg/dL (ref 0.44–1.00)
GFR calc Af Amer: >60 mL/min (ref >60)
GFR calc non Af Amer: 53 mL/min — ABNORMAL LOW (ref >60)
Glucose, Bld: 154 mg/dL — ABNORMAL HIGH (ref 65–99)
Potassium: 3.4 mmol/L — ABNORMAL LOW (ref 3.5–5.1)
Sodium: 141 mmol/L (ref 135–145)

## 2017-05-09 LAB — SURGICAL PCR SCREEN
MRSA, PCR: NEGATIVE
Staphylococcus aureus: NEGATIVE

## 2017-05-09 NOTE — Anesthesia Preprocedure Evaluation (Addendum)
Anesthesia Evaluation  Patient identified by MRN, date of birth, ID band Patient awake    Reviewed: Allergy & Precautions, NPO status , Patient's Chart, lab work & pertinent test results  Airway Mallampati: II  TM Distance: >3 FB Neck ROM: Full    Dental  (+) Teeth Intact, Dental Advisory Given   Pulmonary former smoker,    Pulmonary exam normal        Cardiovascular hypertension, Pt. on medications and Pt. on home beta blockers Normal cardiovascular exam Rhythm:Regular Rate:Normal     Neuro/Psych    GI/Hepatic   Endo/Other    Renal/GU      Musculoskeletal   Abdominal Normal abdominal exam  (+) + obese,   Peds  Hematology   Anesthesia Other Findings   Reproductive/Obstetrics                           Anesthesia Physical Anesthesia Plan  ASA: III  Anesthesia Plan: General   Post-op Pain Management:    Induction: Intravenous  PONV Risk Score and Plan: 4 or greater  Airway Management Planned: Oral ETT  Additional Equipment:   Intra-op Plan:   Post-operative Plan: Extubation in OR  Informed Consent: I have reviewed the patients History and Physical, chart, labs and discussed the procedure including the risks, benefits and alternatives for the proposed anesthesia with the patient or authorized representative who has indicated his/her understanding and acceptance.   Dental advisory given  Plan Discussed with: CRNA and Surgeon  Anesthesia Plan Comments:        Anesthesia Quick Evaluation

## 2017-05-09 NOTE — Progress Notes (Signed)
Pt denies cardiac history, chest pain or sob. Pt states she is not diabetic. 

## 2017-05-10 ENCOUNTER — Ambulatory Visit (HOSPITAL_COMMUNITY): Payer: Medicare Other

## 2017-05-10 ENCOUNTER — Inpatient Hospital Stay (HOSPITAL_COMMUNITY)
Admission: RE | Admit: 2017-05-10 | Discharge: 2017-05-13 | DRG: 519 | Disposition: A | Discharge status: Home / Self Care | Payer: Medicare Other | Source: Ambulatory Visit | Attending: Neurosurgery | Admitting: Neurosurgery

## 2017-05-10 ENCOUNTER — Encounter (HOSPITAL_COMMUNITY): Payer: Self-pay | Admitting: *Deleted

## 2017-05-10 ENCOUNTER — Inpatient Hospital Stay (HOSPITAL_COMMUNITY)
Admission: RE | Disposition: A | Discharge status: Home / Self Care | Payer: Self-pay | Source: Ambulatory Visit | Attending: Neurosurgery

## 2017-05-10 DIAGNOSIS — Z6841 Body Mass Index (BMI) 40.0 and over, adult: Secondary | ICD-10-CM

## 2017-05-10 DIAGNOSIS — Z419 Encounter for procedure for purposes other than remedying health state, unspecified: Secondary | ICD-10-CM

## 2017-05-10 DIAGNOSIS — M4316 Spondylolisthesis, lumbar region: Secondary | ICD-10-CM | POA: Diagnosis not present

## 2017-05-10 DIAGNOSIS — M48062 Spinal stenosis, lumbar region with neurogenic claudication: Principal | ICD-10-CM | POA: Diagnosis present

## 2017-05-10 DIAGNOSIS — M4712 Other spondylosis with myelopathy, cervical region: Secondary | ICD-10-CM | POA: Diagnosis not present

## 2017-05-10 DIAGNOSIS — E669 Obesity, unspecified: Secondary | ICD-10-CM | POA: Diagnosis present

## 2017-05-10 DIAGNOSIS — M4326 Fusion of spine, lumbar region: Secondary | ICD-10-CM | POA: Diagnosis not present

## 2017-05-10 DIAGNOSIS — I10 Essential (primary) hypertension: Secondary | ICD-10-CM | POA: Diagnosis present

## 2017-05-10 DIAGNOSIS — Z87891 Personal history of nicotine dependence: Secondary | ICD-10-CM

## 2017-05-10 HISTORY — PX: LUMBAR LAMINECTOMY/DECOMPRESSION MICRODISCECTOMY: SHX5026

## 2017-05-10 SURGERY — LUMBAR LAMINECTOMY/DECOMPRESSION MICRODISCECTOMY 1 LEVEL
Anesthesia: General

## 2017-05-10 MED ORDER — CEFAZOLIN SODIUM-DEXTROSE 2-4 GM/100ML-% IV SOLN
INTRAVENOUS | Status: AC
  Filled 2017-05-10: qty 100

## 2017-05-10 MED ORDER — PROPOFOL 10 MG/ML IV BOLUS
INTRAVENOUS | Status: AC
  Filled 2017-05-10: qty 20

## 2017-05-10 MED ORDER — FENTANYL CITRATE (PF) 100 MCG/2ML IJ SOLN
INTRAMUSCULAR | Status: DC | PRN
  Administered 2017-05-10: 25 ug via INTRAVENOUS
  Administered 2017-05-10: 50 ug via INTRAVENOUS
  Administered 2017-05-10: 100 ug via INTRAVENOUS
  Administered 2017-05-10: 50 ug via INTRAVENOUS

## 2017-05-10 MED ORDER — ARTIFICIAL TEARS OPHTHALMIC OINT
TOPICAL_OINTMENT | OPHTHALMIC | Status: DC | PRN
  Administered 2017-05-10: 1 via OPHTHALMIC

## 2017-05-10 MED ORDER — ONDANSETRON HCL 4 MG PO TABS: 4.0000 mg | ORAL_TABLET | Freq: Four times a day (QID) | ORAL | Status: DC | PRN

## 2017-05-10 MED ORDER — DIAZEPAM 5 MG PO TABS
ORAL_TABLET | ORAL | Status: AC
  Filled 2017-05-10: qty 1

## 2017-05-10 MED ORDER — POVIDONE-IODINE 10 % EX OINT
TOPICAL_OINTMENT | CUTANEOUS | Status: AC
  Filled 2017-05-10: qty 28.35

## 2017-05-10 MED ORDER — SODIUM CHLORIDE 0.9 % IV SOLN: 250.0000 mL | INTRAVENOUS | Status: DC

## 2017-05-10 MED ORDER — CEFAZOLIN SODIUM-DEXTROSE 1-4 GM/50ML-% IV SOLN
INTRAVENOUS | Status: AC
  Filled 2017-05-10: qty 50

## 2017-05-10 MED ORDER — PROPOFOL 10 MG/ML IV BOLUS
INTRAVENOUS | Status: DC | PRN
  Administered 2017-05-10: 150 mg via INTRAVENOUS

## 2017-05-10 MED ORDER — CHLORHEXIDINE GLUCONATE CLOTH 2 % EX PADS: 6.0000 | MEDICATED_PAD | Freq: Once | CUTANEOUS | Status: DC

## 2017-05-10 MED ORDER — PHENYLEPHRINE HCL 10 MG/ML IJ SOLN: INTRAVENOUS | Status: DC | PRN

## 2017-05-10 MED ORDER — HYDROCODONE-ACETAMINOPHEN 5-325 MG PO TABS: 1.0000 | ORAL_TABLET | ORAL | Status: DC | PRN

## 2017-05-10 MED ORDER — LIDOCAINE-EPINEPHRINE 0.5 %-1:200000 IJ SOLN
INTRAMUSCULAR | Status: AC
  Filled 2017-05-10: qty 1

## 2017-05-10 MED ORDER — ONDANSETRON HCL 4 MG/2ML IJ SOLN: 4.0000 mg | Freq: Four times a day (QID) | INTRAMUSCULAR | Status: DC | PRN

## 2017-05-10 MED ORDER — DEXAMETHASONE SODIUM PHOSPHATE 10 MG/ML IJ SOLN
INTRAMUSCULAR | Status: DC | PRN
  Administered 2017-05-10: 10 mg via INTRAVENOUS

## 2017-05-10 MED ORDER — BISACODYL 5 MG PO TBEC: 5.0000 mg | DELAYED_RELEASE_TABLET | Freq: Every day | ORAL | Status: DC | PRN

## 2017-05-10 MED ORDER — SODIUM CHLORIDE 0.9% FLUSH
3.0000 mL | Freq: Two times a day (BID) | INTRAVENOUS | Status: DC
  Administered 2017-05-10 – 2017-05-13 (×6): 3 mL via INTRAVENOUS

## 2017-05-10 MED ORDER — ACETAMINOPHEN 325 MG PO TABS
650.0000 mg | ORAL_TABLET | ORAL | Status: DC | PRN
  Filled 2017-05-10 (×2): qty 2

## 2017-05-10 MED ORDER — ROCURONIUM BROMIDE 10 MG/ML (PF) SYRINGE
PREFILLED_SYRINGE | INTRAVENOUS | Status: AC
  Filled 2017-05-10: qty 5

## 2017-05-10 MED ORDER — ADULT MULTIVITAMIN LIQUID CH
15.0000 mL | Freq: Every day | ORAL | Status: DC
  Filled 2017-05-10 (×3): qty 15

## 2017-05-10 MED ORDER — 0.9 % SODIUM CHLORIDE (POUR BTL) OPTIME
TOPICAL | Status: DC | PRN
  Administered 2017-05-10: 1000 mL

## 2017-05-10 MED ORDER — LISINOPRIL 20 MG PO TABS
20.0000 mg | ORAL_TABLET | Freq: Every day | ORAL | Status: DC
  Administered 2017-05-11 – 2017-05-12 (×2): 20 mg via ORAL
  Filled 2017-05-10 (×3): qty 1

## 2017-05-10 MED ORDER — ROCURONIUM BROMIDE 100 MG/10ML IV SOLN
INTRAVENOUS | Status: DC | PRN
  Administered 2017-05-10: 50 mg via INTRAVENOUS

## 2017-05-10 MED ORDER — DULOXETINE HCL 60 MG PO CPEP
60.0000 mg | ORAL_CAPSULE | Freq: Every day | ORAL | Status: DC
  Administered 2017-05-11: 60 mg via ORAL
  Filled 2017-05-10: qty 1

## 2017-05-10 MED ORDER — BACITRACIN ZINC 500 UNIT/GM EX OINT
TOPICAL_OINTMENT | CUTANEOUS | Status: AC
  Filled 2017-05-10: qty 28.35

## 2017-05-10 MED ORDER — SENNOSIDES-DOCUSATE SODIUM 8.6-50 MG PO TABS: 1.0000 | ORAL_TABLET | Freq: Every evening | ORAL | Status: DC | PRN

## 2017-05-10 MED ORDER — GABAPENTIN 300 MG PO CAPS
300.0000 mg | ORAL_CAPSULE | Freq: Three times a day (TID) | ORAL | Status: DC
  Administered 2017-05-10 – 2017-05-13 (×9): 300 mg via ORAL
  Filled 2017-05-10 (×9): qty 1

## 2017-05-10 MED ORDER — HYDROCODONE-ACETAMINOPHEN 5-325 MG PO TABS
ORAL_TABLET | ORAL | Status: AC
Start: 2017-05-10 — End: 2017-05-10
  Filled 2017-05-10: qty 2

## 2017-05-10 MED ORDER — GEMFIBROZIL 600 MG PO TABS
600.0000 mg | ORAL_TABLET | Freq: Every day | ORAL | Status: DC
  Administered 2017-05-10 – 2017-05-12 (×3): 600 mg via ORAL
  Filled 2017-05-10 (×4): qty 1

## 2017-05-10 MED ORDER — DIAZEPAM 5 MG PO TABS
5.0000 mg | ORAL_TABLET | Freq: Four times a day (QID) | ORAL | Status: DC | PRN
  Administered 2017-05-10 – 2017-05-12 (×3): 5 mg via ORAL
  Filled 2017-05-10 (×4): qty 1

## 2017-05-10 MED ORDER — ONDANSETRON HCL 4 MG/2ML IJ SOLN
INTRAMUSCULAR | Status: AC
  Filled 2017-05-10: qty 2

## 2017-05-10 MED ORDER — METOPROLOL SUCCINATE ER 100 MG PO TB24
100.0000 mg | ORAL_TABLET | Freq: Every day | ORAL | Status: DC
  Administered 2017-05-12: 100 mg via ORAL
  Filled 2017-05-10 (×3): qty 1

## 2017-05-10 MED ORDER — LIDOCAINE 2% (20 MG/ML) 5 ML SYRINGE
INTRAMUSCULAR | Status: AC
  Filled 2017-05-10: qty 5

## 2017-05-10 MED ORDER — LIDOCAINE HCL (CARDIAC) PF 100 MG/5ML IV SOSY
PREFILLED_SYRINGE | INTRAVENOUS | Status: DC | PRN
  Administered 2017-05-10: 100 mg via INTRAVENOUS

## 2017-05-10 MED ORDER — FAMOTIDINE 40 MG PO TABS
40.0000 mg | ORAL_TABLET | Freq: Every day | ORAL | Status: DC
  Administered 2017-05-10 – 2017-05-12 (×3): 40 mg via ORAL
  Filled 2017-05-10: qty 2
  Filled 2017-05-10: qty 1
  Filled 2017-05-10: qty 2
  Filled 2017-05-10 (×2): qty 1
  Filled 2017-05-10: qty 2
  Filled 2017-05-10: qty 1

## 2017-05-10 MED ORDER — SODIUM CHLORIDE 0.9% FLUSH
3.0000 mL | INTRAVENOUS | Status: DC | PRN
  Administered 2017-05-12: 3 mL via INTRAVENOUS
  Filled 2017-05-10: qty 3

## 2017-05-10 MED ORDER — LEVOTHYROXINE SODIUM 100 MCG PO TABS
100.0000 ug | ORAL_TABLET | Freq: Every day | ORAL | Status: DC
  Administered 2017-05-11 – 2017-05-13 (×3): 100 ug via ORAL
  Filled 2017-05-10 (×3): qty 1

## 2017-05-10 MED ORDER — HYDROCHLOROTHIAZIDE 25 MG PO TABS
25.0000 mg | ORAL_TABLET | Freq: Every day | ORAL | Status: DC
  Administered 2017-05-11 – 2017-05-13 (×3): 25 mg via ORAL
  Filled 2017-05-10 (×3): qty 1

## 2017-05-10 MED ORDER — POTASSIUM CHLORIDE IN NACL 20-0.9 MEQ/L-% IV SOLN
INTRAVENOUS | Status: DC
  Filled 2017-05-10: qty 1000

## 2017-05-10 MED ORDER — PROMETHAZINE HCL 25 MG/ML IJ SOLN: 6.2500 mg | INTRAMUSCULAR | Status: DC | PRN

## 2017-05-10 MED ORDER — QUINAPRIL-HYDROCHLOROTHIAZIDE 20-25 MG PO TABS: 1.0000 | ORAL_TABLET | Freq: Every day | ORAL | Status: DC

## 2017-05-10 MED ORDER — DOCUSATE SODIUM 100 MG PO CAPS: 100.0000 mg | ORAL_CAPSULE | Freq: Every day | ORAL | Status: DC | PRN

## 2017-05-10 MED ORDER — ACETAMINOPHEN 650 MG RE SUPP: 650.0000 mg | RECTAL | Status: DC | PRN

## 2017-05-10 MED ORDER — MAGNESIUM CITRATE PO SOLN: 1.0000 | Freq: Once | ORAL | Status: DC | PRN

## 2017-05-10 MED ORDER — PHENOL 1.4 % MT LIQD: 1.0000 | OROMUCOSAL | Status: DC | PRN

## 2017-05-10 MED ORDER — AMLODIPINE BESYLATE 5 MG PO TABS
5.0000 mg | ORAL_TABLET | Freq: Every day | ORAL | Status: DC
  Administered 2017-05-11 – 2017-05-12 (×2): 5 mg via ORAL
  Filled 2017-05-10 (×3): qty 1

## 2017-05-10 MED ORDER — ASPIRIN EC 81 MG PO TBEC
81.0000 mg | DELAYED_RELEASE_TABLET | Freq: Every day | ORAL | Status: DC
  Administered 2017-05-10 – 2017-05-13 (×4): 81 mg via ORAL
  Filled 2017-05-10 (×4): qty 1

## 2017-05-10 MED ORDER — LACTATED RINGERS IV SOLN
INTRAVENOUS | Status: DC | PRN
  Administered 2017-05-10: 07:00:00 via INTRAVENOUS

## 2017-05-10 MED ORDER — KETOROLAC TROMETHAMINE 30 MG/ML IJ SOLN: 30.0000 mg | Freq: Once | INTRAMUSCULAR | Status: DC | PRN

## 2017-05-10 MED ORDER — THROMBIN (RECOMBINANT) 5000 UNITS EX SOLR
CUTANEOUS | Status: DC | PRN
  Administered 2017-05-10 (×2): 5000 [IU] via TOPICAL

## 2017-05-10 MED ORDER — KETOROLAC TROMETHAMINE 15 MG/ML IJ SOLN
INTRAMUSCULAR | Status: AC
  Filled 2017-05-10: qty 1

## 2017-05-10 MED ORDER — PHENYLEPHRINE HCL 10 MG/ML IJ SOLN
INTRAMUSCULAR | Status: DC | PRN
  Administered 2017-05-10 (×2): 40 ug via INTRAVENOUS
  Administered 2017-05-10: 120 ug via INTRAVENOUS

## 2017-05-10 MED ORDER — OXYCODONE HCL ER 10 MG PO T12A
10.0000 mg | EXTENDED_RELEASE_TABLET | Freq: Two times a day (BID) | ORAL | Status: DC
  Administered 2017-05-10 – 2017-05-13 (×6): 10 mg via ORAL
  Filled 2017-05-10 (×6): qty 1

## 2017-05-10 MED ORDER — DOCUSATE SODIUM 100 MG PO CAPS
100.0000 mg | ORAL_CAPSULE | Freq: Two times a day (BID) | ORAL | Status: DC
  Administered 2017-05-10 – 2017-05-13 (×5): 100 mg via ORAL
  Filled 2017-05-10 (×6): qty 1

## 2017-05-10 MED ORDER — DEXAMETHASONE SODIUM PHOSPHATE 10 MG/ML IJ SOLN
INTRAMUSCULAR | Status: AC
  Filled 2017-05-10: qty 1

## 2017-05-10 MED ORDER — THROMBIN 5000 UNITS EX SOLR
CUTANEOUS | Status: AC
  Filled 2017-05-10: qty 10000

## 2017-05-10 MED ORDER — RALOXIFENE HCL 60 MG PO TABS: 60.0000 mg | ORAL_TABLET | Freq: Every day | ORAL | Status: DC

## 2017-05-10 MED ORDER — MULTIVITAMIN PO LIQD: 1.0000 | Freq: Every day | ORAL | Status: DC

## 2017-05-10 MED ORDER — CEFAZOLIN SODIUM-DEXTROSE 1-4 GM/50ML-% IV SOLN
1.0000 g | Freq: Three times a day (TID) | INTRAVENOUS | Status: AC
  Administered 2017-05-10 – 2017-05-11 (×2): 1 g via INTRAVENOUS
  Filled 2017-05-10: qty 50

## 2017-05-10 MED ORDER — LIDOCAINE-EPINEPHRINE 0.5 %-1:200000 IJ SOLN
INTRAMUSCULAR | Status: DC | PRN
  Administered 2017-05-10: 10 mL

## 2017-05-10 MED ORDER — CELECOXIB 200 MG PO CAPS
200.0000 mg | ORAL_CAPSULE | Freq: Two times a day (BID) | ORAL | Status: DC
  Administered 2017-05-10 – 2017-05-13 (×6): 200 mg via ORAL
  Filled 2017-05-10 (×6): qty 1

## 2017-05-10 MED ORDER — COLESEVELAM HCL 625 MG PO TABS
1250.0000 mg | ORAL_TABLET | Freq: Two times a day (BID) | ORAL | Status: DC
  Administered 2017-05-11 – 2017-05-12 (×3): 1250 mg via ORAL
  Filled 2017-05-10 (×4): qty 2

## 2017-05-10 MED ORDER — HYDROMORPHONE HCL 2 MG/ML IJ SOLN: 0.2500 mg | INTRAMUSCULAR | Status: DC | PRN

## 2017-05-10 MED ORDER — FENTANYL CITRATE (PF) 250 MCG/5ML IJ SOLN
INTRAMUSCULAR | Status: AC
  Filled 2017-05-10: qty 5

## 2017-05-10 MED ORDER — ONDANSETRON HCL 4 MG/2ML IJ SOLN
INTRAMUSCULAR | Status: DC | PRN
  Administered 2017-05-10: 4 mg via INTRAVENOUS

## 2017-05-10 MED ORDER — CLOBETASOL PROPIONATE 0.05 % EX CREA: 1.0000 "application " | TOPICAL_CREAM | Freq: Every day | CUTANEOUS | Status: DC | PRN

## 2017-05-10 MED ORDER — MENTHOL 3 MG MT LOZG: 1.0000 | LOZENGE | OROMUCOSAL | Status: DC | PRN

## 2017-05-10 MED ORDER — KETOROLAC TROMETHAMINE 15 MG/ML IJ SOLN
7.5000 mg | Freq: Four times a day (QID) | INTRAMUSCULAR | Status: AC
  Administered 2017-05-10 – 2017-05-11 (×2): 7.5 mg via INTRAVENOUS
  Filled 2017-05-10: qty 1

## 2017-05-10 MED ORDER — EPHEDRINE SULFATE 50 MG/ML IJ SOLN
INTRAMUSCULAR | Status: DC | PRN
  Administered 2017-05-10: 15 mg via INTRAVENOUS

## 2017-05-10 MED ORDER — CEFAZOLIN SODIUM-DEXTROSE 2-4 GM/100ML-% IV SOLN
2.0000 g | INTRAVENOUS | Status: AC
  Administered 2017-05-10: 2 g via INTRAVENOUS

## 2017-05-10 MED ORDER — HYDROCODONE-ACETAMINOPHEN 5-325 MG PO TABS
2.0000 | ORAL_TABLET | ORAL | Status: DC | PRN
  Administered 2017-05-10 – 2017-05-12 (×4): 2 via ORAL
  Filled 2017-05-10 (×5): qty 2

## 2017-05-10 MED ORDER — MORPHINE SULFATE (PF) 2 MG/ML IV SOLN: 1.0000 mg | INTRAVENOUS | Status: DC | PRN

## 2017-05-10 MED ORDER — HEMOSTATIC AGENTS (NO CHARGE) OPTIME
TOPICAL | Status: DC | PRN
  Administered 2017-05-10: 1 via TOPICAL

## 2017-05-10 MED ORDER — ACETAMINOPHEN 500 MG PO TABS
1000.0000 mg | ORAL_TABLET | Freq: Four times a day (QID) | ORAL | Status: AC
  Administered 2017-05-10 – 2017-05-11 (×2): 1000 mg via ORAL
  Filled 2017-05-10 (×2): qty 2

## 2017-05-10 SURGICAL SUPPLY — 51 items
BENZOIN TINCTURE PRP APPL 2/3 (GAUZE/BANDAGES/DRESSINGS) IMPLANT
BLADE CLIPPER SURG (BLADE) IMPLANT
BUR MATCHSTICK NEURO 3.0 LAGG (BURR) ×2 IMPLANT
BUR PRECISION FLUTE 5.0 (BURR) IMPLANT
CANISTER SUCT 3000ML PPV (MISCELLANEOUS) ×2 IMPLANT
CARTRIDGE OIL MAESTRO DRILL (MISCELLANEOUS) ×1 IMPLANT
DECANTER SPIKE VIAL GLASS SM (MISCELLANEOUS) ×2 IMPLANT
DERMABOND ADVANCED (GAUZE/BANDAGES/DRESSINGS) ×1
DERMABOND ADVANCED .7 DNX12 (GAUZE/BANDAGES/DRESSINGS) ×1 IMPLANT
DIFFUSER DRILL AIR PNEUMATIC (MISCELLANEOUS) ×2 IMPLANT
DRAPE C-ARM 42X72 X-RAY (DRAPES) ×4 IMPLANT
DRAPE C-ARMOR (DRAPES) ×2 IMPLANT
DRAPE LAPAROTOMY 100X72X124 (DRAPES) ×2 IMPLANT
DRAPE MICROSCOPE LEICA (MISCELLANEOUS) ×2 IMPLANT
DRAPE SURG 17X23 STRL (DRAPES) ×2 IMPLANT
DURAPREP 26ML APPLICATOR (WOUND CARE) ×2 IMPLANT
DURASEAL APPLICATOR TIP (TIP) ×2 IMPLANT
DURASEAL SPINE SEALANT 5 POLY (MISCELLANEOUS) ×2 IMPLANT
ELECT REM PT RETURN 9FT ADLT (ELECTROSURGICAL) ×2
ELECTRODE REM PT RTRN 9FT ADLT (ELECTROSURGICAL) ×1 IMPLANT
GAUZE SPONGE 4X4 12PLY STRL (GAUZE/BANDAGES/DRESSINGS) IMPLANT
GAUZE SPONGE 4X4 16PLY XRAY LF (GAUZE/BANDAGES/DRESSINGS) IMPLANT
GLOVE ECLIPSE 6.5 STRL STRAW (GLOVE) ×2 IMPLANT
GLOVE EXAM NITRILE LRG STRL (GLOVE) IMPLANT
GLOVE EXAM NITRILE XL STR (GLOVE) IMPLANT
GLOVE EXAM NITRILE XS STR PU (GLOVE) IMPLANT
GOWN STRL REUS W/ TWL LRG LVL3 (GOWN DISPOSABLE) ×2 IMPLANT
GOWN STRL REUS W/ TWL XL LVL3 (GOWN DISPOSABLE) IMPLANT
GOWN STRL REUS W/TWL 2XL LVL3 (GOWN DISPOSABLE) IMPLANT
GOWN STRL REUS W/TWL LRG LVL3 (GOWN DISPOSABLE) ×2
GOWN STRL REUS W/TWL XL LVL3 (GOWN DISPOSABLE)
KIT BASIN OR (CUSTOM PROCEDURE TRAY) ×2 IMPLANT
KIT TURNOVER KIT B (KITS) ×2 IMPLANT
NEEDLE HYPO 25X1 1.5 SAFETY (NEEDLE) ×2 IMPLANT
NEEDLE SPNL 18GX3.5 QUINCKE PK (NEEDLE) IMPLANT
NS IRRIG 1000ML POUR BTL (IV SOLUTION) ×2 IMPLANT
OIL CARTRIDGE MAESTRO DRILL (MISCELLANEOUS) ×2
PACK LAMINECTOMY NEURO (CUSTOM PROCEDURE TRAY) ×2 IMPLANT
PAD ARMBOARD 7.5X6 YLW CONV (MISCELLANEOUS) ×6 IMPLANT
RUBBERBAND STERILE (MISCELLANEOUS) ×4 IMPLANT
SPONGE LAP 4X18 X RAY DECT (DISPOSABLE) IMPLANT
SPONGE SURGIFOAM ABS GEL SZ50 (HEMOSTASIS) ×2 IMPLANT
STRIP CLOSURE SKIN 1/2X4 (GAUZE/BANDAGES/DRESSINGS) IMPLANT
SUT PROLENE 6 0 BV (SUTURE) ×8 IMPLANT
SUT VIC AB 0 CT1 18XCR BRD8 (SUTURE) ×1 IMPLANT
SUT VIC AB 0 CT1 8-18 (SUTURE) ×1
SUT VIC AB 2-0 CT1 18 (SUTURE) ×2 IMPLANT
SUT VIC AB 3-0 SH 8-18 (SUTURE) ×2 IMPLANT
TOWEL GREEN STERILE (TOWEL DISPOSABLE) ×2 IMPLANT
TOWEL GREEN STERILE FF (TOWEL DISPOSABLE) ×2 IMPLANT
WATER STERILE IRR 1000ML POUR (IV SOLUTION) ×2 IMPLANT

## 2017-05-10 NOTE — Anesthesia Postprocedure Evaluation (Signed)
Anesthesia Post Note  Patient: EVADNE OSE  Procedure(s) Performed: LAMINECTOMY LUMBAR 5- SACRAL 1 (N/A )     Patient location during evaluation: PACU Anesthesia Type: General Level of consciousness: awake Pain management: pain level controlled Vital Signs Assessment: post-procedure vital signs reviewed and stable Respiratory status: spontaneous breathing Cardiovascular status: stable Postop Assessment: no apparent nausea or vomiting Anesthetic complications: no    Last Vitals:  Vitals:   05/10/17 1145 05/10/17 1200  BP: (!) 105/57 (!) 101/57  Pulse: 68 67  Resp: (!) 22 (!) 21  Temp:    SpO2: 93% 93%    Last Pain:  Vitals:   05/10/17 1200  TempSrc:   PainSc: Asleep   Pain Goal: Patients Stated Pain Goal: 2 (05/10/17 0549)  LLE Motor Response: Purposeful movement;Responds to commands (05/10/17 1200) LLE Sensation: No numbness;No tingling (05/10/17 1200) RLE Motor Response: Purposeful movement;Responds to commands (05/10/17 1200) RLE Sensation: No numbness;No tingling (05/10/17 1200)      [place]

## 2017-05-10 NOTE — H&P (Addendum)
BP 128/65   Pulse 92   Temp 98.3 F (36.8 C) (Oral)   Resp 20   Ht 5\' 2"  (1.575 m)   Wt 102.1 kg (225 lb)   SpO2 97%   BMI 41.15 kg/m  Ashley Mercer is a former patient of Dr. Harley Hallmark who underwent a long 7 lumbar fusion in 2016. The canal is at L2-3 and L4-5 and 2-3, 3-4, and 4-5 with interbody arthrodesis. He put in a single at 3-4 and at 4-5. He did not put anything interbody at 2-3. ALLERGIES: She has no known drug allergies. PAST SURGICAL HISTORY: She has undergone a thyroidectomy, tonsillectomy, total abdominal hysterectomy with fibroid removal. MEDICATIONS: She takes Welchol, Cymbalta, Quinapril, Toprol, Synthroid, Lasix, Gemfibrozil, and an over-the-counter medication. PHYSICAL EXAMINATION: Vital Signs: She is 5 feet 2 inches, weighs 227 pounds. Temperature is 98.9, blood pressure 147/80, pulse 79. Pain is 5/10. General: She is alert, oriented by 4, answers all questions appropriately. Neurologic: Memory, language, attention span, and fund of knowledge normal. She is in obvious distress, having to get up in a very stooped position. Her daughter helps her with walking. She has normal strength in the lower extremities but has extremely bad posture. She does have reflexes at the knees and ankles. Pupils equal, round, and reactive to light. Full extraocular movements. Full visual fields. She states that she has a significant amount of back pain across the lower back, not so much into the lower extremities. She has had this pain now for a year and a half. She took a fall and just did not think about calling us about her pain. She has had x-rays. I will obtain an MRI of the lumbar spine. Hopefully, this will give me some more information. I will also obtain some plain x-rays of the lower back. REVIEW OF SYSTEMS: Positive for hypercholesterolemia, swelling in the feet and hands, skin disease, joint pain, back pain, anxiety. Says she has been gaining weight secondary to lack of  exercise. Says she has been getting stronger riding a bike. She has tingling in the hands after the cervical surgery. Weakness in the legs, but PDF created with pdfFactory trial version www.pdffactory.com  that is getting stronger. She cannot stand for longer than 5 to 10 minutes before she is in a great deal of pain. Rated her pain today 4/5. FAMILY HISTORY: Mother died age 41. Father died at age 78. Kidney disease, heart disease. She has full strength in the lower extremities, though she does walk stooped, has a great deal of discomfort. Reflexes not elicited at the knees, trace at the ankles. [name] returns with an MRI of the lumbar spine and what it shows is that she has lumbar stenosis present at L5-S1. The region through the fusion and above the fusion actually looks pretty good. This is one level below the lowest level that has been fused at 4-5. She is 5 feet 2 inches and weighs 224 pounds. Blood pressure is 122/77. Pulse is 85. Temperature is 99.2. She proclaims her back pain is 6/10. She is alert and oriented by 4 today. Strength is full. She has a markedly antalgic gait, does need to hold onto wall or onto her daughter's arm. Memory, language, attention span, and fund of knowledge are normal. I explained the operation, risks and benefits, bleeding, infection, no relief. She has had lumbar procedures before, so is actually well-versed in the operation. I would expect her to be able to leave the next day. We did speak about  the CSF leak which would keep her, but other than that, there would not be any significant impediment.

## 2017-05-10 NOTE — Anesthesia Procedure Notes (Addendum)
Procedure Name: Intubation Date/Time: 05/10/2017 7:48 AM Performed by: Lyn Hollingshead, MD Pre-anesthesia Checklist: Patient identified, Emergency Drugs available, Suction available and Patient being monitored Patient Re-evaluated:Patient Re-evaluated prior to induction Oxygen Delivery Method: Circle System Utilized Preoxygenation: Pre-oxygenation with 100% oxygen Induction Type: IV induction Ventilation: Mask ventilation without difficulty Laryngoscope Size: Mac and 3 Grade View: Grade I Tube type: Oral Tube size: 7.0 mm Number of attempts: 1 Airway Equipment and Method: Stylet Placement Confirmation: ETT inserted through vocal cords under direct vision,  positive ETCO2 and breath sounds checked- equal and bilateral Secured at: 21 cm Tube secured with: Tape Dental Injury: Teeth and Oropharynx as per pre-operative assessment  Comments: Performed by Sydnee Levans

## 2017-05-10 NOTE — Transfer of Care (Signed)
Immediate Anesthesia Transfer of Care Note  Patient: Ashley Mercer  Procedure(s) Performed: LAMINECTOMY LUMBAR 5- SACRAL 1 (N/A )  Patient Location: PACU  Anesthesia Type:General  Level of Consciousness: drowsy  Airway & Oxygen Therapy: Patient Spontanous Breathing, Patient connected to face mask oxygen and 7.5 nasal trumpet L nare   Post-op Assessment: Report given to RN, Post -op Vital signs reviewed and stable and Patient moving all extremities  Post vital signs: Reviewed and stable  Last Vitals:  Vitals Value Taken Time  BP 133/64 05/10/2017 10:18 AM  Temp 36.5 C 05/10/2017 10:15 AM  Pulse 73 05/10/2017 10:20 AM  Resp 23 05/10/2017 10:20 AM  SpO2 87 % 05/10/2017 10:20 AM  Vitals shown include unvalidated device data.  Last Pain:  Vitals:   05/10/17 0555  TempSrc: Oral  PainSc:       Patients Stated Pain Goal: 2 (25/63/89 3734)  Complications: No apparent anesthesia complications

## 2017-05-10 NOTE — Op Note (Signed)
05/10/2017  10:20 AM  PATIENT:  Ashley Mercer  77 y.o. female  PRE-OPERATIVE DIAGNOSIS:  LUMBAR STENOSIS WITH NEUROGENIC CLAUDICATION  POST-OPERATIVE DIAGNOSIS:  lumbar stenosis with neurogenic claudication L5/S1  PROCEDURE:  Procedure(s): LAMINECTOMY LUMBAR 5- SACRAL 1  SURGEON: Surgeon(s): Ashok Pall, MD Newman Pies, MD  ASSISTANTS:Jenkins  ANESTHESIA:   general  EBL:  No intake/output data recorded.  BLOOD ADMINISTERED:none  CELL SAVER GIVEN:none  COUNT:per nursing  DRAINS: none   SPECIMEN:  No Specimen  DICTATION: Ashley Mercer was taken to the operating room, intubated, and placed under a general anesthetic without difficulty. She was positioned prone on the wilson frame with all pressure points padded. Her lumbar region was prepped and draped in a sterile manner. I opened the skin with a 10 blade and dissected sharply to the sacrum and L5 lamina. I used the drill to perform an L5 hemilaminectomy along with Kerrison punches, and dissectors. I opened the dura during the decompression and closed the dural opening primarily. Dr. Arnoldo Morale assisted with the decompression and the closure. The spinal canal was well decompressed. I placed a dural sealant on the repaired sites. I closed the wound in layers, approximating the thoracolumbar fascia, subcuatneous, and subcuticular planes with vicryl sutures. I used a running nylon on the skin edges. I placed a sterile dressing.   PLAN OF CARE: Admit to inpatient   PATIENT DISPOSITION:  PACU - hemodynamically stable.   Delay start of Pharmacological VTE agent (>24hrs) due to surgical blood loss or risk of bleeding:  yes

## 2017-05-11 ENCOUNTER — Encounter (HOSPITAL_COMMUNITY): Payer: Self-pay | Admitting: Neurosurgery

## 2017-05-11 MED ORDER — MORPHINE SULFATE (PF) 4 MG/ML IV SOLN
1.0000 mg | INTRAVENOUS | Status: DC | PRN
  Administered 2017-05-11 – 2017-05-12 (×2): 1 mg via INTRAVENOUS
  Filled 2017-05-11 (×2): qty 1

## 2017-05-11 NOTE — Progress Notes (Signed)
Subjective: The patient is alert and pleasant.  She is in no apparent distress.  She denies present headache.  Objective: Vital signs in last 24 hours: Temp:  [97.6 F (36.4 C)-98.3 F (36.8 C)] 98.3 F (36.8 C) (04/27 1600) Pulse Rate:  [66-85] 67 (04/27 1215) Resp:  [19-23] 20 (04/27 0329) BP: (91-137)/(51-76) 96/53 (04/27 1607) SpO2:  [92 %-99 %] 93 % (04/27 1215) Estimated body mass index is 41.15 kg/m as calculated from the following:   Height as of this encounter: 5\' 2"  (1.575 m).   Weight as of this encounter: 102.1 kg (225 lb).   Intake/Output from previous day: 04/26 0701 - 04/27 0700 In: 170 [P.O.:170] Out: -  Intake/Output this shift: No intake/output data recorded.  Physical exam the patient is alert and pleasant.  She is moving her lower extremities well.  Lab Results: Recent Labs    05/09/17 0937  WBC 12.3*  HGB 13.6  HCT 42.1  PLT 358   BMET Recent Labs    05/09/17 0937  NA 141  K 3.4*  CL 101  CO2 27  GLUCOSE 154*  BUN 14  CREATININE 0.99  CALCIUM 9.5    Studies/Results: Dg Lumbar Spine 1 View  Result Date: 05/10/2017 CLINICAL DATA:  L5-S1 decompression. EXAM: DG C-ARM 61-120 MIN; LUMBAR SPINE - 1 VIEW FLUOROSCOPY TIME:  20 seconds. COMPARISON:  MRI of April 23, 2017. FINDINGS: Single intraoperative fluoroscopic image of the lower lumbar spine demonstrate the patient be status post surgical posterior fusion of L3-4 and L4-5 with bilateral intrapedicular screw placement and interbody fusion. IMPRESSION: Status post surgical posterior fusion as described above. Electronically Signed   By: Marijo Conception, M.D.   On: 05/10/2017 10:34   Dg C-arm 1-60 Min  Result Date: 05/10/2017 CLINICAL DATA:  L5-S1 decompression. EXAM: DG C-ARM 61-120 MIN; LUMBAR SPINE - 1 VIEW FLUOROSCOPY TIME:  20 seconds. COMPARISON:  MRI of April 23, 2017. FINDINGS: Single intraoperative fluoroscopic image of the lower lumbar spine demonstrate the patient be status post  surgical posterior fusion of L3-4 and L4-5 with bilateral intrapedicular screw placement and interbody fusion. IMPRESSION: Status post surgical posterior fusion as described above. Electronically Signed   By: Marijo Conception, M.D.   On: 05/10/2017 10:34    Assessment/Plan: Postop day #1: We will keep her at bedrest per Dr. Lacy Duverney orders until tomorrow.  LOS: 1 day     Ashley Mercer 05/11/2017, 7:05 PM

## 2017-05-12 MED ORDER — COLESEVELAM HCL 625 MG PO TABS
1250.0000 mg | ORAL_TABLET | ORAL | Status: DC
  Administered 2017-05-12 – 2017-05-13 (×2): 1250 mg via ORAL
  Filled 2017-05-12 (×4): qty 2

## 2017-05-12 MED ORDER — DULOXETINE HCL 60 MG PO CPEP
60.0000 mg | ORAL_CAPSULE | Freq: Every day | ORAL | Status: DC
  Administered 2017-05-12: 60 mg via ORAL
  Filled 2017-05-12: qty 1

## 2017-05-12 NOTE — Progress Notes (Signed)
Subjective: The patient is alert and pleasant.  She denies headaches.  She wants to get up.  Objective: Vital signs in last 24 hours: Temp:  [97.7 F (36.5 C)-98.3 F (36.8 C)] 97.7 F (36.5 C) (04/28 0300) Pulse Rate:  [60-69] 60 (04/28 0300) Resp:  [15-16] 16 (04/28 0300) BP: (91-133)/(51-76) 133/76 (04/28 0300) SpO2:  [93 %-97 %] 95 % (04/28 0300) Estimated body mass index is 41.15 kg/m as calculated from the following:   Height as of this encounter: 5\' 2"  (1.575 m).   Weight as of this encounter: 102.1 kg (225 lb).   Intake/Output from previous day: 04/27 0701 - 04/28 0700 In: 240 [P.O.:240] Out: 200 [Urine:200] Intake/Output this shift: No intake/output data recorded.  Physical exam patient is alert and pleasant.  She is moving her lower extremities well.  Lab Results: Recent Labs    05/09/17 0937  WBC 12.3*  HGB 13.6  HCT 42.1  PLT 358   BMET Recent Labs    05/09/17 0937  NA 141  K 3.4*  CL 101  CO2 27  GLUCOSE 154*  BUN 14  CREATININE 0.99  CALCIUM 9.5    Studies/Results: No results found.  Assessment/Plan: Postop day #2: I will raise her head of bed to 45 degrees.  If she does not have any headaches we will mobilize her.  LOS: 2 days     Ashley Mercer 05/12/2017, 9:22 AM

## 2017-05-13 MED ORDER — TIZANIDINE HCL 4 MG PO TABS: 4.0000 mg | ORAL_TABLET | Freq: Four times a day (QID) | ORAL | 0 refills | Status: DC | PRN

## 2017-05-13 MED ORDER — OXYCODONE HCL 5 MG PO TABS: 5.0000 mg | ORAL_TABLET | Freq: Four times a day (QID) | ORAL | 0 refills | Status: DC | PRN

## 2017-05-13 MED FILL — Thrombin For Soln 5000 Unit: CUTANEOUS | Qty: 5000 | Status: AC

## 2017-05-13 NOTE — Discharge Summary (Signed)
BP 117/66   Pulse 66   Temp (!) 97.5 F (36.4 C)   Resp 16   Ht 5\' 2"  (1.575 m)   Wt 102.1 kg (225 lb)   SpO2 92%   BMI 41.15 kg/m  Physician Discharge Summary  Patient ID: Ashley Mercer MRN: [id number] DOB/AGE: January 03, 1941 77 y.o.  Admit date: 05/10/2017 Discharge date: 05/13/2017  Admission Diagnoses:Lumbar stenosis, neurogenic claudication  Discharge Diagnoses:  Active Problems:   Lumbar stenosis with neurogenic claudication   Discharged Condition: good  Hospital Course: Ashley Mercer was admitted after a lumbar decompression at L5/S1. Post op she was alert, oriented, had full strength in the lower extremities. Her wound is clean, dry, no signs of infection. Moving well, ambulating, voiding, and tolerating a regular diet.   Treatments: surgery: LAMINECTOMY LUMBAR 5- SACRAL 1   Discharge Exam: Blood pressure 117/66, pulse 66, temperature (!) 97.5 F (36.4 C), resp. rate 16, height 5\' 2"  (1.575 m), weight 102.1 kg (225 lb), SpO2 92 %. General appearance: alert, cooperative, appears stated age and mild distress Neurologic: Alert and oriented X 3, normal strength and tone. Normal symmetric reflexes. Normal coordination and gait  Disposition: Discharge disposition: 01-Home or Self Care      LUMBAR STENOSIS WITH NEUROGENIC CLAUDICATION  Allergies as of 05/13/2017   No Known Allergies     Medication List    TAKE these medications   amLODipine 5 MG tablet Commonly known as:  NORVASC Take 5 mg by mouth daily.   aspirin EC 81 MG tablet Take 81 mg by mouth daily.   clobetasol cream 0.05 % Commonly known as:  TEMOVATE Apply 1 application topically daily as needed (psorisis).   colesevelam 625 MG tablet Commonly known as:  WELCHOL Take 1,250 mg by mouth 2 (two) times daily with a meal.   docusate sodium 100 MG capsule Commonly known as:  COLACE Take 100 mg by mouth daily as needed for mild constipation.   DULoxetine 60 MG capsule Commonly known as:   CYMBALTA Take 60 mg by mouth daily.   ibuprofen 800 MG tablet Commonly known as:  ADVIL,MOTRIN Take 800 mg by mouth every 8 (eight) hours as needed for mild pain.   levothyroxine 100 MCG tablet Commonly known as:  SYNTHROID, LEVOTHROID Take 100 mcg by mouth daily before breakfast.   LOPID 600 MG tablet Generic drug:  gemfibrozil Take 600 mg by mouth daily.   metoprolol succinate 100 MG 24 hr tablet Commonly known as:  TOPROL-XL Take 100 mg by mouth daily.   MULTIVITAMIN PO Take 1 tablet by mouth daily.   oxyCODONE 5 MG immediate release tablet Commonly known as:  ROXICODONE Take 1 tablet (5 mg total) by mouth every 6 (six) hours as needed for severe pain.   quinapril-hydrochlorothiazide 20-25 MG tablet Commonly known as:  ACCURETIC Take 1 tablet by mouth daily.   raloxifene 60 MG tablet Commonly known as:  EVISTA Take 60 mg by mouth daily.   ranitidine 300 MG capsule Commonly known as:  ZANTAC Take 300 mg by mouth daily as needed for heartburn.   tiZANidine 4 MG tablet Commonly known as:  ZANAFLEX Take 1 tablet (4 mg total) by mouth every 6 (six) hours as needed for muscle spasms.      Follow-up Information    Ashok Pall, MD Follow up in 1 week(s).   Specialty:  Neurosurgery Why:  call to make an appointment for suture removal Contact information: [address] [place] [address]  [phone]           Signed: Nadia Viar L 05/13/2017, 6:21 PM

## 2017-05-13 NOTE — Discharge Instructions (Signed)
.  kc °

## 2017-06-13 DIAGNOSIS — M545 Low back pain: Secondary | ICD-10-CM | POA: Diagnosis not present

## 2017-06-13 DIAGNOSIS — R262 Difficulty in walking, not elsewhere classified: Secondary | ICD-10-CM | POA: Diagnosis not present

## 2017-06-13 DIAGNOSIS — R2681 Unsteadiness on feet: Secondary | ICD-10-CM | POA: Diagnosis not present

## 2017-06-13 DIAGNOSIS — M48062 Spinal stenosis, lumbar region with neurogenic claudication: Secondary | ICD-10-CM | POA: Diagnosis not present

## 2017-06-14 DIAGNOSIS — M48062 Spinal stenosis, lumbar region with neurogenic claudication: Secondary | ICD-10-CM | POA: Diagnosis not present

## 2017-06-14 DIAGNOSIS — R262 Difficulty in walking, not elsewhere classified: Secondary | ICD-10-CM | POA: Diagnosis not present

## 2017-06-14 DIAGNOSIS — R2681 Unsteadiness on feet: Secondary | ICD-10-CM | POA: Diagnosis not present

## 2017-06-14 DIAGNOSIS — M545 Low back pain: Secondary | ICD-10-CM | POA: Diagnosis not present

## 2017-06-17 DIAGNOSIS — R262 Difficulty in walking, not elsewhere classified: Secondary | ICD-10-CM | POA: Diagnosis not present

## 2017-06-17 DIAGNOSIS — M48062 Spinal stenosis, lumbar region with neurogenic claudication: Secondary | ICD-10-CM | POA: Diagnosis not present

## 2017-06-17 DIAGNOSIS — R2681 Unsteadiness on feet: Secondary | ICD-10-CM | POA: Diagnosis not present

## 2017-06-17 DIAGNOSIS — M545 Low back pain: Secondary | ICD-10-CM | POA: Diagnosis not present

## 2017-06-18 DIAGNOSIS — R2681 Unsteadiness on feet: Secondary | ICD-10-CM | POA: Diagnosis not present

## 2017-06-18 DIAGNOSIS — M48062 Spinal stenosis, lumbar region with neurogenic claudication: Secondary | ICD-10-CM | POA: Diagnosis not present

## 2017-06-18 DIAGNOSIS — R262 Difficulty in walking, not elsewhere classified: Secondary | ICD-10-CM | POA: Diagnosis not present

## 2017-06-18 DIAGNOSIS — M545 Low back pain: Secondary | ICD-10-CM | POA: Diagnosis not present

## 2017-06-19 DIAGNOSIS — M545 Low back pain: Secondary | ICD-10-CM | POA: Diagnosis not present

## 2017-06-19 DIAGNOSIS — R2681 Unsteadiness on feet: Secondary | ICD-10-CM | POA: Diagnosis not present

## 2017-06-19 DIAGNOSIS — M48062 Spinal stenosis, lumbar region with neurogenic claudication: Secondary | ICD-10-CM | POA: Diagnosis not present

## 2017-06-19 DIAGNOSIS — R262 Difficulty in walking, not elsewhere classified: Secondary | ICD-10-CM | POA: Diagnosis not present

## 2017-06-21 DIAGNOSIS — M545 Low back pain: Secondary | ICD-10-CM | POA: Diagnosis not present

## 2017-06-21 DIAGNOSIS — M48062 Spinal stenosis, lumbar region with neurogenic claudication: Secondary | ICD-10-CM | POA: Diagnosis not present

## 2017-06-21 DIAGNOSIS — R262 Difficulty in walking, not elsewhere classified: Secondary | ICD-10-CM | POA: Diagnosis not present

## 2017-06-21 DIAGNOSIS — R2681 Unsteadiness on feet: Secondary | ICD-10-CM | POA: Diagnosis not present

## 2017-06-25 DIAGNOSIS — M545 Low back pain: Secondary | ICD-10-CM | POA: Diagnosis not present

## 2017-06-25 DIAGNOSIS — M48062 Spinal stenosis, lumbar region with neurogenic claudication: Secondary | ICD-10-CM | POA: Diagnosis not present

## 2017-06-25 DIAGNOSIS — R2681 Unsteadiness on feet: Secondary | ICD-10-CM | POA: Diagnosis not present

## 2017-06-25 DIAGNOSIS — R262 Difficulty in walking, not elsewhere classified: Secondary | ICD-10-CM | POA: Diagnosis not present

## 2017-06-26 DIAGNOSIS — M545 Low back pain: Secondary | ICD-10-CM | POA: Diagnosis not present

## 2017-06-26 DIAGNOSIS — R262 Difficulty in walking, not elsewhere classified: Secondary | ICD-10-CM | POA: Diagnosis not present

## 2017-06-26 DIAGNOSIS — R2681 Unsteadiness on feet: Secondary | ICD-10-CM | POA: Diagnosis not present

## 2017-06-26 DIAGNOSIS — M48062 Spinal stenosis, lumbar region with neurogenic claudication: Secondary | ICD-10-CM | POA: Diagnosis not present

## 2017-06-28 DIAGNOSIS — R2681 Unsteadiness on feet: Secondary | ICD-10-CM | POA: Diagnosis not present

## 2017-06-28 DIAGNOSIS — M48062 Spinal stenosis, lumbar region with neurogenic claudication: Secondary | ICD-10-CM | POA: Diagnosis not present

## 2017-06-28 DIAGNOSIS — M545 Low back pain: Secondary | ICD-10-CM | POA: Diagnosis not present

## 2017-06-28 DIAGNOSIS — R262 Difficulty in walking, not elsewhere classified: Secondary | ICD-10-CM | POA: Diagnosis not present

## 2017-07-01 DIAGNOSIS — R262 Difficulty in walking, not elsewhere classified: Secondary | ICD-10-CM | POA: Diagnosis not present

## 2017-07-01 DIAGNOSIS — R2681 Unsteadiness on feet: Secondary | ICD-10-CM | POA: Diagnosis not present

## 2017-07-01 DIAGNOSIS — M48062 Spinal stenosis, lumbar region with neurogenic claudication: Secondary | ICD-10-CM | POA: Diagnosis not present

## 2017-07-01 DIAGNOSIS — M545 Low back pain: Secondary | ICD-10-CM | POA: Diagnosis not present

## 2017-07-02 DIAGNOSIS — M48062 Spinal stenosis, lumbar region with neurogenic claudication: Secondary | ICD-10-CM | POA: Diagnosis not present

## 2017-07-02 DIAGNOSIS — M545 Low back pain: Secondary | ICD-10-CM | POA: Diagnosis not present

## 2017-07-02 DIAGNOSIS — R262 Difficulty in walking, not elsewhere classified: Secondary | ICD-10-CM | POA: Diagnosis not present

## 2017-07-02 DIAGNOSIS — R2681 Unsteadiness on feet: Secondary | ICD-10-CM | POA: Diagnosis not present

## 2017-07-03 DIAGNOSIS — M545 Low back pain: Secondary | ICD-10-CM | POA: Diagnosis not present

## 2017-07-03 DIAGNOSIS — R2681 Unsteadiness on feet: Secondary | ICD-10-CM | POA: Diagnosis not present

## 2017-07-03 DIAGNOSIS — M48062 Spinal stenosis, lumbar region with neurogenic claudication: Secondary | ICD-10-CM | POA: Diagnosis not present

## 2017-07-03 DIAGNOSIS — R262 Difficulty in walking, not elsewhere classified: Secondary | ICD-10-CM | POA: Diagnosis not present

## 2017-07-04 DIAGNOSIS — R262 Difficulty in walking, not elsewhere classified: Secondary | ICD-10-CM | POA: Diagnosis not present

## 2017-07-04 DIAGNOSIS — M48062 Spinal stenosis, lumbar region with neurogenic claudication: Secondary | ICD-10-CM | POA: Diagnosis not present

## 2017-07-04 DIAGNOSIS — M545 Low back pain: Secondary | ICD-10-CM | POA: Diagnosis not present

## 2017-07-04 DIAGNOSIS — R2681 Unsteadiness on feet: Secondary | ICD-10-CM | POA: Diagnosis not present

## 2017-07-30 DIAGNOSIS — R7309 Other abnormal glucose: Secondary | ICD-10-CM | POA: Diagnosis not present

## 2017-07-30 DIAGNOSIS — N3000 Acute cystitis without hematuria: Secondary | ICD-10-CM | POA: Diagnosis not present

## 2017-07-30 DIAGNOSIS — L309 Dermatitis, unspecified: Secondary | ICD-10-CM | POA: Diagnosis not present

## 2017-07-30 DIAGNOSIS — I10 Essential (primary) hypertension: Secondary | ICD-10-CM | POA: Diagnosis not present

## 2017-07-30 DIAGNOSIS — E782 Mixed hyperlipidemia: Secondary | ICD-10-CM | POA: Diagnosis not present

## 2017-07-30 DIAGNOSIS — Z6841 Body Mass Index (BMI) 40.0 and over, adult: Secondary | ICD-10-CM | POA: Diagnosis not present

## 2017-07-30 DIAGNOSIS — N390 Urinary tract infection, site not specified: Secondary | ICD-10-CM | POA: Diagnosis not present

## 2017-07-30 DIAGNOSIS — R809 Proteinuria, unspecified: Secondary | ICD-10-CM | POA: Diagnosis not present

## 2017-08-22 DIAGNOSIS — H2513 Age-related nuclear cataract, bilateral: Secondary | ICD-10-CM | POA: Diagnosis not present

## 2018-01-15 DIAGNOSIS — R809 Proteinuria, unspecified: Secondary | ICD-10-CM

## 2018-01-15 HISTORY — DX: Proteinuria, unspecified: R80.9

## 2018-02-28 ENCOUNTER — Other Ambulatory Visit: Payer: Self-pay | Admitting: Obstetrics and Gynecology

## 2018-02-28 DIAGNOSIS — Z1231 Encounter for screening mammogram for malignant neoplasm of breast: Secondary | ICD-10-CM

## 2018-03-31 ENCOUNTER — Ambulatory Visit: Payer: Medicare Other

## 2018-06-19 DIAGNOSIS — K219 Gastro-esophageal reflux disease without esophagitis: Secondary | ICD-10-CM | POA: Diagnosis not present

## 2018-06-19 DIAGNOSIS — Z1389 Encounter for screening for other disorder: Secondary | ICD-10-CM | POA: Diagnosis not present

## 2018-06-19 DIAGNOSIS — M8588 Other specified disorders of bone density and structure, other site: Secondary | ICD-10-CM | POA: Diagnosis not present

## 2018-06-19 DIAGNOSIS — E782 Mixed hyperlipidemia: Secondary | ICD-10-CM | POA: Diagnosis not present

## 2018-06-19 DIAGNOSIS — E039 Hypothyroidism, unspecified: Secondary | ICD-10-CM | POA: Diagnosis not present

## 2018-06-19 DIAGNOSIS — I10 Essential (primary) hypertension: Secondary | ICD-10-CM | POA: Diagnosis not present

## 2018-06-19 DIAGNOSIS — M545 Low back pain: Secondary | ICD-10-CM | POA: Diagnosis not present

## 2018-06-19 DIAGNOSIS — R202 Paresthesia of skin: Secondary | ICD-10-CM | POA: Diagnosis not present

## 2018-06-19 DIAGNOSIS — Z0000 Encounter for general adult medical examination without abnormal findings: Secondary | ICD-10-CM | POA: Diagnosis not present

## 2018-06-19 DIAGNOSIS — Z6841 Body Mass Index (BMI) 40.0 and over, adult: Secondary | ICD-10-CM | POA: Diagnosis not present

## 2018-06-19 DIAGNOSIS — R7303 Prediabetes: Secondary | ICD-10-CM | POA: Diagnosis not present

## 2018-07-14 DIAGNOSIS — E039 Hypothyroidism, unspecified: Secondary | ICD-10-CM | POA: Diagnosis not present

## 2018-07-14 DIAGNOSIS — R7303 Prediabetes: Secondary | ICD-10-CM | POA: Diagnosis not present

## 2018-07-14 DIAGNOSIS — E782 Mixed hyperlipidemia: Secondary | ICD-10-CM | POA: Diagnosis not present

## 2018-07-14 DIAGNOSIS — I10 Essential (primary) hypertension: Secondary | ICD-10-CM | POA: Diagnosis not present

## 2018-07-28 DIAGNOSIS — R809 Proteinuria, unspecified: Secondary | ICD-10-CM | POA: Diagnosis not present

## 2018-08-08 DIAGNOSIS — N390 Urinary tract infection, site not specified: Secondary | ICD-10-CM | POA: Diagnosis not present

## 2018-09-15 ENCOUNTER — Other Ambulatory Visit: Payer: Self-pay

## 2018-09-15 ENCOUNTER — Ambulatory Visit
Admission: RE | Admit: 2018-09-15 | Discharge: 2018-09-15 | Disposition: A | Discharge status: Home / Self Care | Payer: Medicare Other | Source: Ambulatory Visit | Attending: Obstetrics and Gynecology | Admitting: Obstetrics and Gynecology

## 2018-09-15 DIAGNOSIS — Z1231 Encounter for screening mammogram for malignant neoplasm of breast: Secondary | ICD-10-CM

## 2018-09-16 ENCOUNTER — Other Ambulatory Visit: Payer: Self-pay

## 2018-09-16 DIAGNOSIS — Z20822 Contact with and (suspected) exposure to covid-19: Secondary | ICD-10-CM

## 2018-09-16 DIAGNOSIS — R6889 Other general symptoms and signs: Secondary | ICD-10-CM | POA: Diagnosis not present

## 2018-09-17 ENCOUNTER — Other Ambulatory Visit: Payer: Self-pay | Admitting: Obstetrics and Gynecology

## 2018-09-17 DIAGNOSIS — R928 Other abnormal and inconclusive findings on diagnostic imaging of breast: Secondary | ICD-10-CM

## 2018-09-18 ENCOUNTER — Other Ambulatory Visit: Payer: Self-pay | Admitting: Family Medicine

## 2018-09-18 DIAGNOSIS — E039 Hypothyroidism, unspecified: Secondary | ICD-10-CM | POA: Diagnosis not present

## 2018-09-18 DIAGNOSIS — R928 Other abnormal and inconclusive findings on diagnostic imaging of breast: Secondary | ICD-10-CM

## 2018-09-18 DIAGNOSIS — I10 Essential (primary) hypertension: Secondary | ICD-10-CM | POA: Diagnosis not present

## 2018-09-18 DIAGNOSIS — E782 Mixed hyperlipidemia: Secondary | ICD-10-CM | POA: Diagnosis not present

## 2018-09-18 DIAGNOSIS — E7800 Pure hypercholesterolemia, unspecified: Secondary | ICD-10-CM | POA: Diagnosis not present

## 2018-09-18 LAB — NOVEL CORONAVIRUS, NAA: SARS-CoV-2, NAA: NOT DETECTED

## 2018-09-23 ENCOUNTER — Other Ambulatory Visit: Payer: Self-pay | Admitting: Obstetrics and Gynecology

## 2018-09-23 DIAGNOSIS — R928 Other abnormal and inconclusive findings on diagnostic imaging of breast: Secondary | ICD-10-CM

## 2018-09-24 ENCOUNTER — Ambulatory Visit
Admission: RE | Admit: 2018-09-24 | Discharge: 2018-09-24 | Disposition: A | Discharge status: Home / Self Care | Payer: Self-pay | Source: Ambulatory Visit | Attending: Obstetrics and Gynecology | Admitting: Obstetrics and Gynecology

## 2018-09-24 ENCOUNTER — Other Ambulatory Visit: Payer: Self-pay

## 2018-09-24 ENCOUNTER — Ambulatory Visit: Payer: Medicare Other

## 2018-09-24 DIAGNOSIS — R928 Other abnormal and inconclusive findings on diagnostic imaging of breast: Secondary | ICD-10-CM | POA: Diagnosis not present

## 2018-09-24 DIAGNOSIS — R921 Mammographic calcification found on diagnostic imaging of breast: Secondary | ICD-10-CM | POA: Diagnosis not present

## 2018-10-01 DIAGNOSIS — R809 Proteinuria, unspecified: Secondary | ICD-10-CM | POA: Diagnosis not present

## 2018-10-01 DIAGNOSIS — R399 Unspecified symptoms and signs involving the genitourinary system: Secondary | ICD-10-CM | POA: Diagnosis not present

## 2018-10-01 DIAGNOSIS — I129 Hypertensive chronic kidney disease with stage 1 through stage 4 chronic kidney disease, or unspecified chronic kidney disease: Secondary | ICD-10-CM | POA: Diagnosis not present

## 2018-10-01 DIAGNOSIS — E669 Obesity, unspecified: Secondary | ICD-10-CM | POA: Diagnosis not present

## 2018-10-07 DIAGNOSIS — M79604 Pain in right leg: Secondary | ICD-10-CM | POA: Diagnosis not present

## 2018-10-07 DIAGNOSIS — G259 Extrapyramidal and movement disorder, unspecified: Secondary | ICD-10-CM | POA: Diagnosis not present

## 2018-10-07 DIAGNOSIS — R29898 Other symptoms and signs involving the musculoskeletal system: Secondary | ICD-10-CM | POA: Diagnosis not present

## 2018-10-07 DIAGNOSIS — R0789 Other chest pain: Secondary | ICD-10-CM | POA: Diagnosis not present

## 2018-10-07 DIAGNOSIS — B354 Tinea corporis: Secondary | ICD-10-CM | POA: Diagnosis not present

## 2018-10-08 ENCOUNTER — Other Ambulatory Visit: Payer: Self-pay | Admitting: Internal Medicine

## 2018-10-08 DIAGNOSIS — R809 Proteinuria, unspecified: Secondary | ICD-10-CM

## 2018-10-14 ENCOUNTER — Ambulatory Visit
Admission: RE | Admit: 2018-10-14 | Discharge: 2018-10-14 | Disposition: A | Discharge status: Home / Self Care | Payer: Medicare Other | Source: Ambulatory Visit | Attending: Internal Medicine | Admitting: Internal Medicine

## 2018-10-14 ENCOUNTER — Other Ambulatory Visit: Payer: Self-pay | Admitting: Family Medicine

## 2018-10-14 ENCOUNTER — Ambulatory Visit
Admission: RE | Admit: 2018-10-14 | Discharge: 2018-10-14 | Disposition: A | Discharge status: Home / Self Care | Payer: Medicare Other | Source: Ambulatory Visit | Attending: Family Medicine | Admitting: Family Medicine

## 2018-10-14 DIAGNOSIS — M79604 Pain in right leg: Secondary | ICD-10-CM

## 2018-10-14 DIAGNOSIS — N281 Cyst of kidney, acquired: Secondary | ICD-10-CM | POA: Diagnosis not present

## 2018-10-14 DIAGNOSIS — R809 Proteinuria, unspecified: Secondary | ICD-10-CM

## 2018-10-14 DIAGNOSIS — M79661 Pain in right lower leg: Secondary | ICD-10-CM | POA: Diagnosis not present

## 2018-10-23 ENCOUNTER — Telehealth: Payer: Self-pay | Admitting: Neurology

## 2018-10-23 NOTE — Telephone Encounter (Signed)
Ashley Mercer with me. -VRP

## 2018-10-23 NOTE — Telephone Encounter (Signed)
We've received a referral on patient for jerky leg movements when walking and leg weakness. It looks like she was assigned to Dr. Leta Baptist back in 2014 after Dr. Erling Cruz left, and had a follow up with him. She is now requesting to see Dr. Jaynee Eagles for this. Would you both be ok with this change?

## 2018-10-23 NOTE — Telephone Encounter (Signed)
I don't mind, but can I see the referral first and Dr. Tressia Danas notes in the past please? Want to make sure she doesn't need a movement disorder specialist. thanks

## 2018-10-26 NOTE — Telephone Encounter (Signed)
Transfer is fine with me, thanks

## 2018-11-03 DIAGNOSIS — I10 Essential (primary) hypertension: Secondary | ICD-10-CM | POA: Diagnosis not present

## 2018-11-03 DIAGNOSIS — E7800 Pure hypercholesterolemia, unspecified: Secondary | ICD-10-CM | POA: Diagnosis not present

## 2018-11-03 DIAGNOSIS — E782 Mixed hyperlipidemia: Secondary | ICD-10-CM | POA: Diagnosis not present

## 2018-11-03 DIAGNOSIS — E039 Hypothyroidism, unspecified: Secondary | ICD-10-CM | POA: Diagnosis not present

## 2018-11-17 ENCOUNTER — Institutional Professional Consult (permissible substitution): Payer: Medicare Other | Admitting: Diagnostic Neuroimaging

## 2018-11-17 ENCOUNTER — Other Ambulatory Visit: Payer: Self-pay | Admitting: Family Medicine

## 2018-11-19 ENCOUNTER — Other Ambulatory Visit: Payer: Self-pay | Admitting: Family Medicine

## 2018-11-19 DIAGNOSIS — M4722 Other spondylosis with radiculopathy, cervical region: Secondary | ICD-10-CM

## 2018-12-01 ENCOUNTER — Other Ambulatory Visit: Payer: Self-pay

## 2018-12-01 ENCOUNTER — Ambulatory Visit (INDEPENDENT_AMBULATORY_CARE_PROVIDER_SITE_OTHER): Payer: Medicare Other | Admitting: Neurology

## 2018-12-01 ENCOUNTER — Encounter: Payer: Self-pay | Admitting: Neurology

## 2018-12-01 VITALS — BP 121/78 | HR 67 | Temp 97.4°F | Ht 62.0 in | Wt 220.0 lb

## 2018-12-01 DIAGNOSIS — R202 Paresthesia of skin: Secondary | ICD-10-CM

## 2018-12-01 DIAGNOSIS — R200 Anesthesia of skin: Secondary | ICD-10-CM

## 2018-12-01 DIAGNOSIS — G959 Disease of spinal cord, unspecified: Secondary | ICD-10-CM | POA: Diagnosis not present

## 2018-12-01 DIAGNOSIS — R531 Weakness: Secondary | ICD-10-CM | POA: Diagnosis not present

## 2018-12-01 NOTE — Patient Instructions (Signed)
- Emg/ncs of bilateral uppers to evaluate for CTS, however may also be symptomatic/residual from cervical myelopathy. Also one leg to ensure no muscle disorders.  - MRI cervical spine already ordered - Weakness in the legs chronic, had stenosis s/p lumbar decompression, she also has a fear of falling, she has localized pain in the knee and leg and she says it is severe and brings tears to her eyes and this may be causing leg disuse and pain.  - Physical Therapy - F/u with Dr. Christella Noa to ensure lumbar surgery stable - F/u with Dr. Alyson Ingles for localized pain    Fall Prevention in the Home, Adult Falls can cause injuries. They can happen to people of all ages. There are many things you can do to make your home safe and to help prevent falls. Ask for help when making these changes, if needed. What actions can I take to prevent falls? General Instructions  Use good lighting in all rooms. Replace any light bulbs that burn out.  Turn on the lights when you go into a dark area. Use night-lights.  Keep items that you use often in easy-to-reach places. Lower the shelves around your home if necessary.  Set up your furniture so you have a clear path. Avoid moving your furniture around.  Do not have throw rugs and other things on the floor that can make you trip.  Avoid walking on wet floors.  If any of your floors are uneven, fix them.  Add color or contrast paint or tape to clearly mark and help you see: ? Any grab bars or handrails. ? First and last steps of stairways. ? Where the edge of each step is.  If you use a stepladder: ? Make sure that it is fully opened. Do not climb a closed stepladder. ? Make sure that both sides of the stepladder are locked into place. ? Ask someone to hold the stepladder for you while you use it.  If there are any pets around you, be aware of where they are. What can I do in the bathroom?      Keep the floor dry. Clean up any water that spills onto the  floor as soon as it happens.  Remove soap buildup in the tub or shower regularly.  Use non-skid mats or decals on the floor of the tub or shower.  Attach bath mats securely with double-sided, non-slip rug tape.  If you need to sit down in the shower, use a plastic, non-slip stool.  Install grab bars by the toilet and in the tub and shower. Do not use towel bars as grab bars. What can I do in the bedroom?  Make sure that you have a light by your bed that is easy to reach.  Do not use any sheets or blankets that are too big for your bed. They should not hang down onto the floor.  Have a firm chair that has side arms. You can use this for support while you get dressed. What can I do in the kitchen?  Clean up any spills right away.  If you need to reach something above you, use a strong step stool that has a grab bar.  Keep electrical cords out of the way.  Do not use floor polish or wax that makes floors slippery. If you must use wax, use non-skid floor wax. What can I do with my stairs?  Do not leave any items on the stairs.  Make sure that you have  a light switch at the top of the stairs and the bottom of the stairs. If you do not have them, ask someone to add them for you.  Make sure that there are handrails on both sides of the stairs, and use them. Fix handrails that are broken or loose. Make sure that handrails are as long as the stairways.  Install non-slip stair treads on all stairs in your home.  Avoid having throw rugs at the top or bottom of the stairs. If you do have throw rugs, attach them to the floor with carpet tape.  Choose a carpet that does not hide the edge of the steps on the stairway.  Check any carpeting to make sure that it is firmly attached to the stairs. Fix any carpet that is loose or worn. What can I do on the outside of my home?  Use bright outdoor lighting.  Regularly fix the edges of walkways and driveways and fix any cracks.  Remove  anything that might make you trip as you walk through a door, such as a raised step or threshold.  Trim any bushes or trees on the path to your home.  Regularly check to see if handrails are loose or broken. Make sure that both sides of any steps have handrails.  Install guardrails along the edges of any raised decks and porches.  Clear walking paths of anything that might make someone trip, such as tools or rocks.  Have any leaves, snow, or ice cleared regularly.  Use sand or salt on walking paths during winter.  Clean up any spills in your garage right away. This includes grease or oil spills. What other actions can I take?  Wear shoes that: ? Have a low heel. Do not wear high heels. ? Have rubber bottoms. ? Are comfortable and fit you well. ? Are closed at the toe. Do not wear open-toe sandals.  Use tools that help you move around (mobility aids) if they are needed. These include: ? Canes. ? Walkers. ? Scooters. ? Crutches.  Review your medicines with your doctor. Some medicines can make you feel dizzy. This can increase your chance of falling. Ask your doctor what other things you can do to help prevent falls. Where to find more information  Centers for Disease Control and Prevention, STEADI: https://garcia.biz/  [employer] on Aging: BrainJudge.co.uk Contact a doctor if:  You are afraid of falling at home.  You feel weak, drowsy, or dizzy at home.  You fall at home. Summary  There are many simple things that you can do to make your home safe and to help prevent falls.  Ways to make your home safe include removing tripping hazards and installing grab bars in the bathroom.  Ask for help when making these changes in your home. This information is not intended to replace advice given to you by your health care provider. Make sure you discuss any questions you have with your health care provider. Document Released: 10/28/2008 Document Revised:  04/24/2018 Document Reviewed: 08/16/2016 Elsevier Patient Education  2020 [employer].

## 2018-12-01 NOTE — Progress Notes (Signed)
[facility]    Provider:  Dr Jaynee Eagles Requesting Provider: Maury Dus, MD Primary Care Provider:  Maury Dus, MD  CC:  weakness  HPI:  Ashley Mercer is a 78 y.o. female here as requested by Maury Dus, MD for movement disorder. She has a PMHx of myelopathy cervical spine s/p decompression 2011, gait weakness and lumbar decompression in 04/2017 as well as prior lumbar surgery. She was seen by Dr. Erling Cruz in 2011 and followed in the practice, saw Dr. Leta Baptist in 2014. She has also had 3 back surgeries. She feels she is getting weaker, her legs and her arms, she cannot pick up thins with her fingers. The legs give out which is new as well. In dece,ber they all flew down to [place] for Christmas and she weak but she was able to walk. When the pandemic came she did not go out as much but she was unable to walk as much. She felt tingling and numbness in the hands. Lyrica helps. She has no strength in the hands, numb all the time, the hands started January of 2020 and worsening slowly. Both hands are symmetric. The legs are a separate issue. Her leg issues started worsening after surgery by Dr. Christella Noa in 2019 for lumbar stenosis,  She has had weakness for years, she cant walk up and down stairs, she had a lot of PT right after the surgery but none this year. She has stairs, takes time to walk down she can't make it down, she has localized pain in the knee and leg. No other focal neurologic deficits, associated symptoms, inciting events or modifiable factors.  Reviewed notes, labs and imaging from outside physicians, which showed:  I reviewed past notes from Dr. Erling Cruz in 2011 and 2012 as well as Dr. Gladstone Lighter notes from 2013 and 2014.  In June 2010 she noticed difficulty getting up to go to the bathroom and loss of balance, stumbling, she noticed weakness in her arms, difficulty with numbness in the hands, heat on the lateral aspect of both legs, MRI study of the brain from November  2010 showed a herniated nucleus pulposus at C4-C5 (I personally reviewed images and agree with findings) and in the sagittal view as well as severe spinal stenosis but there were no brain abnormalities.  She underwent anterior cervical discectomy, decompression and fusion on January 28, 2009.  Since then her symptoms did improve but she remained with numbness and tingling in the fingers, also having continued finger dexterity issues and weakness albeit improved since surgery.  She started having back pain since the summer 2012 and has since undergone multiple back surgeries.   CMP in the past 1 year ago showed BUN 14 and creatinine 0.99.  Review of Systems: Patient complains of symptoms per HPI as well as the following symptoms: weakness. Pertinent negatives and positives per HPI. All others negative.   Social History   Socioeconomic History  . Marital status: Married    Spouse name: Ashley Mercer  . Number of children: 5  . Years of education: College  . Highest education level: Not on file  Occupational History  . Occupation: Retired    Comment: Animal nutritionist  . Financial resource strain: Not on file  . Food insecurity    Worry: Not on file    Inability: Not on file  . Transportation needs    Medical: Not on file    Non-medical: Not on file  Tobacco Use  . Smoking status: Former Research scientist (life sciences)  .  Smokeless tobacco: Never Used  Substance and Sexual Activity  . Alcohol use: Yes    Comment: quit in 1997; wine occasionally  . Drug use: No  . Sexual activity: Not on file  Lifestyle  . Physical activity    Days per week: Not on file    Minutes per session: Not on file  . Stress: Not on file  Relationships  . Social Herbalist on phone: Not on file    Gets together: Not on file    Attends religious service: Not on file    Active member of club or organization: Not on file    Attends meetings of clubs or organizations: Not on file    Relationship status: Not on file  . Intimate  partner violence    Fear of current or ex partner: Not on file    Emotionally abused: Not on file    Physically abused: Not on file    Forced sexual activity: Not on file  Other Topics Concern  . Not on file  Social History Narrative   Patient lives at home with her spouse.   Caffeine Use: 2 cups daily   Right handed    Family History  Problem Relation Age of Onset  . Heart disease Mother   . Diabetes Mother   . Cancer Maternal Grandfather   . Breast cancer Neg Hx     Past Medical History:  Diagnosis Date  . Anemia    during pregnancy  . Anxiety   . Arthritis   . Cervical spinal stenosis   . Chronic back pain   . Dry skin   . Eczema   . Fatty tumor    Right wrist  . Frequent UTI   . GERD (gastroesophageal reflux disease)   . High cholesterol   . History of blood in urine    Pt was seen by [facility]; scant amount; no current issues  . Hypertension   . Hypothyroidism   . Lumbar spinal stenosis   . Numbness and tingling in hands    Bilateral  . Pneumonia   . Proteinuria 2020   seeing a kidney specialist  . Thyroid condition     Patient Active Problem List   Diagnosis Date Noted  . Lumbar stenosis with neurogenic claudication 05/10/2017  . Spondylolisthesis of lumbar region 05/14/2014  . Spinal stenosis of lumbar region 10/14/2012  . Spondylosis, cervical, with myelopathy 10/14/2012  . Gait difficulty 10/14/2012    Past Surgical History:  Procedure Laterality Date  . APPENDECTOMY    . BREAST BIOPSY Left 2006   benign  . BREAST EXCISIONAL BIOPSY Left 2006   papilloma  . CERVICAL SPINE SURGERY  01/28/2009  . COLONOSCOPY W/ POLYPECTOMY    . EYE SURGERY Bilateral 01/2017   cataract surgery  . LUMBAR LAMINECTOMY/DECOMPRESSION MICRODISCECTOMY N/A 05/10/2017   Procedure: LAMINECTOMY LUMBAR 5- SACRAL 1;  Surgeon: Ashok Pall, MD;  Location: [facility];  Service: Neurosurgery;  Laterality: N/A;  LAMINECTOMY LUMBAR 5- SACRAL 1  . LUMBAR SPINE SURGERY  2018   . THYROIDECTOMY  1971  . TONSILLECTOMY    . TOTAL ABDOMINAL HYSTERECTOMY  1986    Current Outpatient Medications  Medication Sig Dispense Refill  . amLODipine (NORVASC) 5 MG tablet Take 5 mg by mouth daily.     Marland Kitchen aspirin EC 81 MG tablet Take 81 mg by mouth daily.    . Calcium-Magnesium 500-250 MG TABS Take 1 tablet by mouth daily.    Marland Kitchen  colesevelam (WELCHOL) 625 MG tablet Take 1,250 mg by mouth 2 (two) times daily with a meal.    . cyclobenzaprine (FLEXERIL) 10 MG tablet Take 5-10 mg by mouth at bedtime as needed (back spasms).    . docusate sodium (COLACE) 100 MG capsule Take 100 mg by mouth 4 (four) times daily as needed for mild constipation.     . DULoxetine (CYMBALTA) 60 MG capsule Take 60 mg by mouth daily.    . famotidine (PEPCID) 40 MG tablet Take 40 mg by mouth at bedtime.    . furosemide (LASIX) 20 MG tablet Take 1-2 tablets by mouth daily.     Marland Kitchen gemfibrozil (LOPID) 600 MG tablet Take 600 mg by mouth 2 (two) times daily.     Marland Kitchen levothyroxine (SYNTHROID, LEVOTHROID) 100 MCG tablet Take 100 mcg by mouth daily before breakfast.    . metoprolol succinate (TOPROL-XL) 100 MG 24 hr tablet Take 100 mg by mouth daily.   0  . pregabalin (LYRICA) 75 MG capsule Take 75 mg by mouth 2 (two) times daily.    . quinapril-hydrochlorothiazide (ACCURETIC) 20-25 MG per tablet Take 1 tablet by mouth daily.    . raloxifene (EVISTA) 60 MG tablet Take 60 mg by mouth daily.    Marland Kitchen oxyCODONE (ROXICODONE) 5 MG immediate release tablet Take 1 tablet (5 mg total) by mouth every 6 (six) hours as needed for severe pain. 30 tablet 0   No current facility-administered medications for this visit.     Allergies as of 12/01/2018  . (No Known Allergies)    Vitals: BP 121/78 (BP Location: Right Arm, Patient Position: Sitting)   Pulse 67   Temp (!) 97.4 F (36.3 C) Comment: daughter 9; taken at front  Ht 5\' 2"  (1.575 m)   Wt 220 lb (99.8 kg)   BMI 40.24 kg/m  Last Weight:  Wt Readings from Last 1  Encounters:  12/01/18 220 lb (99.8 kg)   Last Height:   Ht Readings from Last 1 Encounters:  12/01/18 5\' 2"  (1.575 m)     Physical exam: Exam: Gen: NAD, conversant, well nourised, obese, well groomed                     CV: RRR, no +SEM. No Carotid Bruits. + mild distal peripheral edema, warm, nontender Eyes: Conjunctivae clear without exudates or hemorrhage  Neuro: Detailed Neurologic Exam  Speech:    Speech is normal; fluent and spontaneous with normal comprehension.  Cognition:    The patient is oriented to person, place, and time;     recent and remote memory intact;     language fluent;     normal attention, concentration,     fund of knowledge Cranial Nerves:    The pupils are equal, round, and reactive to light. Attempted fundoscopy could not visualize. Visual fields are full to finger confrontation. Extraocular movements are intact. Trigeminal sensation is intact and the muscles of mastication are normal. The face is symmetric. The palate elevates in the midline. Hearing intact. Voice is normal. Shoulder shrug is normal. The tongue has normal motion without fasciculations.   Coordination:    Normal finger to nose and heel to shin. Normal rapid alternating movements.   Gait:    Heel-toe and tandem gait are normal.   Motor Observation:    No asymmetry, no atrophy, and no involuntary movements noted. Tone:    Normal muscle tone.    Posture:    Posture is normal. normal  erect    Strength: Strength is symmetric, no focal weakness, good strength in the legs.      Sensation: intact to LT     Reflex Exam:  DTR's:   Hypo AJs otherwise deep tendon reflexes in the upper and lower extremities are brisk (moreso on the left) bilaterally.   Toes:    The toes are equiv bilaterally.   Clonus:    Clonus AJ left side    Assessment/Plan:  [age] year old female here with her daughter who also provides much information. She has a PMHx of myelopathy cervical spine s/p  decompression 2011, gait weakness and lumbar decompression in 04/2017 as well as prior lumbar surgery. She was seen by Dr. Erling Cruz in 2011 and followed in the practice, saw Dr. Leta Baptist in 2014.  - Emg/ncs of bilateral uppers to evaluate for CTS, however may also be symptomatic/residual from cervical myelopathy. Also right leg to ensure no muscle disorders.  - MRI cervical spine already ordered - Weakness in the legs chronic, had stenosis s/p lumbar decompression, she also has a fear of falling, she has localized pain in the knee and leg and she says it is severe and brings tears to her eyes and this may be causing leg disuse and perceived weakness her strength is actually quite good on exam in the lower extremities (when she stands she reports severe right knee pain) - Physical Therapy - recommend, she declines at this time due to pain - F/u with Dr. Christella Noa to ensure lumbar surgery stable - F/u with Dr. Alyson Ingles for localized pain   Orders Placed This Encounter  Procedures  . NCV with EMG(electromyography)    Cc: Maury Dus, MD  Sarina Ill, MD  [facility] [address] [place], [place] [address]  Phone [phone] Fax [phone]

## 2018-12-04 ENCOUNTER — Other Ambulatory Visit: Payer: Self-pay

## 2018-12-04 ENCOUNTER — Ambulatory Visit
Admission: RE | Admit: 2018-12-04 | Discharge: 2018-12-04 | Disposition: A | Discharge status: Home / Self Care | Payer: Medicare Other | Source: Ambulatory Visit | Attending: Family Medicine | Admitting: Family Medicine

## 2018-12-04 DIAGNOSIS — M4802 Spinal stenosis, cervical region: Secondary | ICD-10-CM | POA: Diagnosis not present

## 2018-12-04 DIAGNOSIS — M4722 Other spondylosis with radiculopathy, cervical region: Secondary | ICD-10-CM

## 2018-12-15 DIAGNOSIS — M4722 Other spondylosis with radiculopathy, cervical region: Secondary | ICD-10-CM | POA: Diagnosis not present

## 2018-12-15 DIAGNOSIS — M4712 Other spondylosis with myelopathy, cervical region: Secondary | ICD-10-CM | POA: Diagnosis not present

## 2018-12-15 DIAGNOSIS — M48062 Spinal stenosis, lumbar region with neurogenic claudication: Secondary | ICD-10-CM | POA: Diagnosis not present

## 2018-12-18 ENCOUNTER — Other Ambulatory Visit: Payer: Self-pay | Admitting: Neurosurgery

## 2018-12-18 DIAGNOSIS — M48062 Spinal stenosis, lumbar region with neurogenic claudication: Secondary | ICD-10-CM

## 2018-12-19 DIAGNOSIS — M170 Bilateral primary osteoarthritis of knee: Secondary | ICD-10-CM | POA: Diagnosis not present

## 2018-12-19 DIAGNOSIS — M25561 Pain in right knee: Secondary | ICD-10-CM | POA: Diagnosis not present

## 2019-01-07 ENCOUNTER — Other Ambulatory Visit: Payer: Self-pay

## 2019-01-07 ENCOUNTER — Ambulatory Visit
Admission: RE | Admit: 2019-01-07 | Discharge: 2019-01-07 | Disposition: A | Discharge status: Home / Self Care | Payer: Medicare Other | Source: Ambulatory Visit | Attending: Neurosurgery | Admitting: Neurosurgery

## 2019-01-07 DIAGNOSIS — M48062 Spinal stenosis, lumbar region with neurogenic claudication: Secondary | ICD-10-CM

## 2019-01-07 MED ORDER — GADOBENATE DIMEGLUMINE 529 MG/ML IV SOLN
20.0000 mL | Freq: Once | INTRAVENOUS | Status: AC | PRN
  Administered 2019-01-07: 20 mL via INTRAVENOUS

## 2019-01-22 ENCOUNTER — Encounter: Payer: Medicare Other | Admitting: Neurology

## 2019-02-05 DIAGNOSIS — I10 Essential (primary) hypertension: Secondary | ICD-10-CM | POA: Diagnosis not present

## 2019-02-05 DIAGNOSIS — E039 Hypothyroidism, unspecified: Secondary | ICD-10-CM | POA: Diagnosis not present

## 2019-02-05 DIAGNOSIS — E7800 Pure hypercholesterolemia, unspecified: Secondary | ICD-10-CM | POA: Diagnosis not present

## 2019-02-05 DIAGNOSIS — E782 Mixed hyperlipidemia: Secondary | ICD-10-CM | POA: Diagnosis not present

## 2019-02-25 DIAGNOSIS — R809 Proteinuria, unspecified: Secondary | ICD-10-CM | POA: Diagnosis not present

## 2019-02-25 DIAGNOSIS — E782 Mixed hyperlipidemia: Secondary | ICD-10-CM | POA: Diagnosis not present

## 2019-02-25 DIAGNOSIS — Z01818 Encounter for other preprocedural examination: Secondary | ICD-10-CM | POA: Diagnosis not present

## 2019-02-25 DIAGNOSIS — I10 Essential (primary) hypertension: Secondary | ICD-10-CM | POA: Diagnosis not present

## 2019-02-25 DIAGNOSIS — R7309 Other abnormal glucose: Secondary | ICD-10-CM | POA: Diagnosis not present

## 2019-03-02 ENCOUNTER — Telehealth: Payer: Self-pay | Admitting: Neurology

## 2019-03-02 DIAGNOSIS — G9520 Unspecified cord compression: Secondary | ICD-10-CM

## 2019-03-02 DIAGNOSIS — M4802 Spinal stenosis, cervical region: Secondary | ICD-10-CM

## 2019-03-02 NOTE — Telephone Encounter (Signed)
Ashley Mercer, looks like patient has c3-c4 cord compression. This MR was completed by Dr. Maury Dus so I did no tget results. She is scheduled for an emg/ncs this Thursday but this should be cacelled and she needs to be seen in neurosurgery instead if she has not already. I don;t have Access to Dr. Noland Fordyce notes so I don;t know if she went but we may cancel the emg/ncs until she gets the compression addressed. Can you call and make sure Dr. Noland Fordyce office followed up on that? thanks

## 2019-03-02 NOTE — Patient Instructions (Signed)
DUE TO COVID-19 ONLY ONE VISITOR IS ALLOWED TO COME WITH YOU AND STAY IN THE WAITING ROOM ONLY DURING PRE OP AND PROCEDURE DAY OF SURGERY. THE 1 VISITOR MAY VISIT WITH YOU AFTER SURGERY IN YOUR PRIVATE ROOM DURING VISITING HOURS ONLY!  YOU NEED TO HAVE A COVID 19 TEST ON_2/18/21______ @__11 :30_____, THIS TEST MUST BE DONE BEFORE SURGERY, COME  [place] , [address].  ([place]) ONCE YOUR COVID TEST IS COMPLETED, PLEASE BEGIN THE QUARANTINE INSTRUCTIONS AS OUTLINED IN YOUR HANDOUT.                Ashley Mercer    Your procedure is scheduled on: 03/09/19   Report to [facility] Main  Entrance   Report to admitting at  10:10 AM     Call this number if you have problems the morning of surgery [phone]   . BRUSH YOUR TEETH MORNING OF SURGERY AND RINSE YOUR MOUTH OUT, NO CHEWING GUM CANDY OR MINTS.   Do not eat food After Midnight  YOU MAY HAVE CLEAR LIQUIDS FROM MIDNIGHT UNTIL 9:30 AM    CLEAR LIQUID DIET   Foods Allowed                                                                     Foods Excluded  Coffee and tea, regular and decaf                             liquids that you cannot  Plain Jell-O any favor except red or purple                                           see through such as: Fruit ices (not with fruit pulp)                                     milk, soups, orange juice  Iced Popsicles                                    All solid food Carbonated beverages, regular and diet                                    Cranberry, grape and apple juices Sports drinks like Gatorade Lightly seasoned clear broth or consume(fat free) Sugar, honey syrup    . At 9:30 AM Please finish the prescribed Pre-Surgery  drink  . Nothing by mouth after you finish the  drink !   Take these medicines the morning of surgery with A SIP OF WATER:  Lyrica, Metoprolol, Amlodipine,Raloxifene, Levothyroxine,Pepcid                                  You may not have any metal on your body  including hair pins and              piercings  Do not wear jewelry, make-up, lotions, powders or perfumes, deodorant             Do not wear nail polish on your fingernails.  Do not shave  48 hours prior to surgery.               Do not bring valuables to the hospital. [place].  Contacts, dentures or bridgework may not be worn into surgery.                   Please read over the following fact sheets you were given: _____________________________________________________________________             [facility] - Preparing for Surgery  Before surgery, you can play an important role.   Because skin is not sterile, your skin needs to be as free of germs as possible.   You can reduce the number of germs on your skin by washing with CHG (chlorahexidine gluconate) soap before surgery.   CHG is an antiseptic cleaner which kills germs and bonds with the skin to continue killing germs even after washing. Please DO NOT use if you have an allergy to CHG or antibacterial soaps.   If your skin becomes reddened/irritated stop using the CHG and inform your nurse when you arrive at Short Stay. Do not shave (including legs and underarms) for at least 48 hours prior to the first CHG shower.    Please follow these instructions carefully:  1.  Shower with CHG Soap the night before surgery and the  morning of Surgery.  2.  If you choose to wash your hair, wash your hair first as usual with your  normal  shampoo.  3.  After you shampoo, rinse your hair and body thoroughly to remove the  shampoo.                                        4.  Use CHG as you would any other liquid soap.  You can apply chg directly  to the skin and wash                       Gently with a scrungie or clean washcloth.  5.  Apply the CHG Soap to your body ONLY FROM THE NECK DOWN.   Do not use on face/ open                           Wound  or open sores. Avoid contact with eyes, ears mouth and genitals (private parts).                       Wash face,  Genitals (private parts) with your normal soap.             6.  Wash thoroughly, paying special attention to the area where your surgery  will be performed.  7.  Thoroughly rinse your body with warm water from the neck down.  8.  DO NOT shower/wash with your normal soap after using and rinsing off  the CHG Soap.  9.  Pat yourself dry with a clean towel.            10.  Wear clean pajamas.            11.  Place clean sheets on your bed the night of your first shower and do not  sleep with pets. Day of Surgery : Do not apply any lotions/deodorants the morning of surgery.  Please wear clean clothes to the hospital/surgery center.  FAILURE TO FOLLOW THESE INSTRUCTIONS MAY RESULT IN THE CANCELLATION OF YOUR SURGERY PATIENT SIGNATURE_________________________________  NURSE SIGNATURE__________________________________  ________________________________________________________________________   Adam Phenix  An incentive spirometer is a tool that can help keep your lungs clear and active. This tool measures how well you are filling your lungs with each breath. Taking long deep breaths may help reverse or decrease the chance of developing breathing (pulmonary) problems (especially infection) following:  A long period of time when you are unable to move or be active. BEFORE THE PROCEDURE   If the spirometer includes an indicator to show your best effort, your nurse or respiratory therapist will set it to a desired goal.  If possible, sit up straight or lean slightly forward. Try not to slouch.  Hold the incentive spirometer in an upright position. INSTRUCTIONS FOR USE  1. Sit on the edge of your bed if possible, or sit up as far as you can in bed or on a chair. 2. Hold the incentive spirometer in an upright position. 3. Breathe out normally. 4. Place the  mouthpiece in your mouth and seal your lips tightly around it. 5. Breathe in slowly and as deeply as possible, raising the piston or the ball toward the top of the column. 6. Hold your breath for 3-5 seconds or for as long as possible. Allow the piston or ball to fall to the bottom of the column. 7. Remove the mouthpiece from your mouth and breathe out normally. 8. Rest for a few seconds and repeat Steps 1 through 7 at least 10 times every 1-2 hours when you are awake. Take your time and take a few normal breaths between deep breaths. 9. The spirometer may include an indicator to show your best effort. Use the indicator as a goal to work toward during each repetition. 10. After each set of 10 deep breaths, practice coughing to be sure your lungs are clear. If you have an incision (the cut made at the time of surgery), support your incision when coughing by placing a pillow or rolled up towels firmly against it. Once you are able to get out of bed, walk around indoors and cough well. You may stop using the incentive spirometer when instructed by your caregiver.  RISKS AND COMPLICATIONS  Take your time so you do not get dizzy or light-headed.  If you are in pain, you may need to take or ask for pain medication before doing incentive spirometry. It is harder to take a deep breath if you are having pain. AFTER USE  Rest and breathe slowly and easily.  It can be helpful to keep track of a log of your progress. Your caregiver can provide you with a simple table to help with this. If you are using the spirometer at home, follow these instructions: [place] IF:   You are having difficultly using the spirometer.  You have trouble using the spirometer as often as instructed.  Your pain medication is not giving enough relief while using the spirometer.  You develop  fever of 100.5 F (38.1 C) or higher. SEEK IMMEDIATE MEDICAL CARE IF:   You cough up bloody sputum that had not been present  before.  You develop fever of 102 F (38.9 C) or greater.  You develop worsening pain at or near the incision site. MAKE SURE YOU:   Understand these instructions.  Will watch your condition.  Will get help right away if you are not doing well or get worse. Document Released: 05/14/2006 Document Revised: 03/26/2011 Document Reviewed: 07/15/2006 ExitCare Patient Information 2014 ExitCare, [place].   ________________________________________________________________________  WHAT IS A BLOOD TRANSFUSION? Blood Transfusion Information  A transfusion is the replacement of blood or some of its parts. Blood is made up of multiple cells which provide different functions.  Red blood cells carry oxygen and are used for blood loss replacement.  White blood cells fight against infection.  Platelets control bleeding.  Plasma helps clot blood.  Other blood products are available for specialized needs, such as hemophilia or other clotting disorders. BEFORE THE TRANSFUSION  Who gives blood for transfusions?   Healthy volunteers who are fully evaluated to make sure their blood is safe. This is blood bank blood. Transfusion therapy is the safest it has ever been in the practice of medicine. Before blood is taken from a donor, a complete history is taken to make sure that person has no history of diseases nor engages in risky social behavior (examples are intravenous drug use or sexual activity with multiple partners). The donor's travel history is screened to minimize risk of transmitting infections, such as malaria. The donated blood is tested for signs of infectious diseases, such as HIV and hepatitis. The blood is then tested to be sure it is compatible with you in order to minimize the chance of a transfusion reaction. If you or a relative donates blood, this is often done in anticipation of surgery and is not appropriate for emergency situations. It takes many days to process the donated  blood. RISKS AND COMPLICATIONS Although transfusion therapy is very safe and saves many lives, the main dangers of transfusion include:   Getting an infectious disease.  Developing a transfusion reaction. This is an allergic reaction to something in the blood you were given. Every precaution is taken to prevent this. The decision to have a blood transfusion has been considered carefully by your caregiver before blood is given. Blood is not given unless the benefits outweigh the risks. AFTER THE TRANSFUSION  Right after receiving a blood transfusion, you will usually feel much better and more energetic. This is especially true if your red blood cells have gotten low (anemic). The transfusion raises the level of the red blood cells which carry oxygen, and this usually causes an energy increase.  The nurse administering the transfusion will monitor you carefully for complications. HOME CARE INSTRUCTIONS  No special instructions are needed after a transfusion. You may find your energy is better. Speak with your caregiver about any limitations on activity for underlying diseases you may have. SEEK MEDICAL CARE IF:   Your condition is not improving after your transfusion.  You develop redness or irritation at the intravenous (IV) site. SEEK IMMEDIATE MEDICAL CARE IF:  Any of the following symptoms occur over the next 12 hours:  Shaking chills.  You have a temperature by mouth above 102 F (38.9 C), not controlled by medicine.  Chest, back, or muscle pain.  People around you feel you are not acting correctly or are confused.  Shortness of breath  or difficulty breathing.  Dizziness and fainting.  You get a rash or develop hives.  You have a decrease in urine output.  Your urine turns a dark color or changes to pink, red, or brown. Any of the following symptoms occur over the next 10 days:  You have a temperature by mouth above 102 F (38.9 C), not controlled by  medicine.  Shortness of breath.  Weakness after normal activity.  The white part of the eye turns yellow (jaundice).  You have a decrease in the amount of urine or are urinating less often.  Your urine turns a dark color or changes to pink, red, or brown. Document Released: 12/30/1999 Document Revised: 03/26/2011 Document Reviewed: 08/18/2007 [facility] Patient Information 2014 [place], [place].  _______________________________________________________________________

## 2019-03-03 ENCOUNTER — Telehealth: Payer: Self-pay | Admitting: Neurology

## 2019-03-03 NOTE — Telephone Encounter (Signed)
Urgent referral faxed to [facility].

## 2019-03-03 NOTE — Telephone Encounter (Signed)
I called the pt and gave her Dr. Cathren Laine message. I rescheduled the pt for EMG/NCV this Thurs 2/18 @ 10:00 AM arrival 9:30 AM. Pt aware Dr. Jaynee Eagles will discuss with her and daughter and review images at that time. Pt verbalized appreciation for the call. Looks like she may have a COVID test or other screening on Thurs AM. She will cone day surgery to reschedule for another time that day.

## 2019-03-03 NOTE — Telephone Encounter (Signed)
Referral to neurosurgery canceled per Dr. Jaynee Eagles as she spoke with Dr. Christella Noa.

## 2019-03-03 NOTE — Telephone Encounter (Signed)
I spoke to Dr. Christella Noa, we will proceed with emg/ncs we will put her back on the schedule. Dr. Christella Noa is fine with her proceeding to surgery next week, I can discuss with patient and daughter Thursday thanks

## 2019-03-03 NOTE — Addendum Note (Signed)
Addended by: Gildardo Griffes on: 03/03/2019 11:13 AM   Modules accepted: Orders

## 2019-03-03 NOTE — Addendum Note (Signed)
Addended by: Gildardo Griffes on: 03/03/2019 01:39 PM   Modules accepted: Orders

## 2019-03-03 NOTE — Telephone Encounter (Signed)
I spoke with Dr. Alyson Ingles today, patient with C3-C4 cord compression she was informed and instructed to see Dr. Christella Noa in neurosurgery.  I discussed with Dr. Alyson Ingles today unfortunately he did not have neurosurgery notes and I do not have access to their notes either so we did not know what happened, and patient denied knowing about the MRI of the cervical spine or seeing neurosurgery.  I spoke with Dr. Hewitt Shorts admin today, patient was seen apparently in November, Dr. Christella Noa may still want her to have the EMG nerve conduction study to ensure there is no other etiology for her symptoms, I have asked for a call from Dr. Christella Noa to see if we should proceed with emg/ncs.  Also patient is having surgery on Monday for knee replacement, I am not sure this is wise given the cervical cord compression in case she needs to be intubated but I am not really sure what is involved in knee surgery.  In any case I will wait for Dr. Hewitt Shorts call, and he can instruct patient if knee surgery is still advisable, and we can also proceed with EMG nerve conduction study if he would like [place] to.

## 2019-03-03 NOTE — Telephone Encounter (Signed)
Spoke with patient and discussed the message from Dr. Jaynee Eagles. Pt stated she was unaware about her need to see neurosurgery for her neck. She is agreeable to [place] sending a referral (per Dr. Jaynee Eagles) to Dr. Christella Noa whom she is established with. She is also in agreement to cancel EMG. Pt stated she is supposed to have a knee replacement on Monday. She verbalized appreciation for the call.  I canceled the EMG. Dr. Jaynee Eagles is reaching out to Dr. Noland Fordyce office at this time.

## 2019-03-04 ENCOUNTER — Other Ambulatory Visit: Payer: Self-pay

## 2019-03-04 ENCOUNTER — Encounter (HOSPITAL_COMMUNITY)
Admission: RE | Admit: 2019-03-04 | Discharge: 2019-03-04 | Disposition: A | Discharge status: Home / Self Care | Payer: Medicare Other | Source: Ambulatory Visit | Attending: Orthopedic Surgery | Admitting: Orthopedic Surgery

## 2019-03-04 ENCOUNTER — Encounter (HOSPITAL_COMMUNITY): Payer: Self-pay

## 2019-03-04 NOTE — Progress Notes (Signed)
Anesthesia Chart Review   Case: [id number] Date/Time: 03/09/19 1221   Procedure: TOTAL KNEE ARTHROPLASTY (Right Knee) - 75min   Anesthesia type: Choice   Pre-op diagnosis: RIGHT KNEE OSTEOARTHRITIS   Location: [facility]   Surgeons: Gaynelle Arabian, MD      DISCUSSION:79 y.o. former smoker (2.5 pack years, quit 03/03/88) with h/o HTN, hypothyroidism, GERD, chronic LBBB, cervical spine stenosis, right knee OA scheduled for above procedure 03/09/19 with Dr. Gaynelle Arabian.   Pt cleared by PCP, clearance on chart.   History of lumbar fusion with hardware in place L2-L5.   Pt seen by neurologist, 12/01/2018.  Per Dr. Sarina Ill 03/02/19, "Bethany, looks like patient has c3-c4 cord compression. This MR was completed by Dr. Maury Dus so I did no tget results. She is scheduled for an emg/ncs this Thursday but this should be cacelled and she needs to be seen in neurosurgery instead if she has not already. I don;t have Access to Dr. Noland Fordyce notes so I don;t know if she went but we may cancel the emg/ncs until she gets the compression addressed."  She was referred to neurosurgeon.    Per telephone note by Dr. Sarina Ill 03/03/2019, "I spoke to Dr. Christella Noa, we will proceed with emg/ncs we will put her back on the schedule. Dr. Christella Noa is fine with her proceeding to surgery next week, I can discuss with patient and daughter Thursday thanks"  Anticipate pt can proceed with planned procedure barring acute status change.   VS: There were no vitals taken for this visit.  PROVIDERS: Maury Dus, MD is PCP    LABS: Labs reviewed: Acceptable for surgery. and labs on chart.  type and screen DOS (pt missed PAT appointment) (all labs ordered are listed, but only abnormal results are displayed)  Labs Reviewed - No data to display   IMAGES:   EKG: EKG 02/25/19 Rate 64 bpm  Sinus rhythm  Left bundle branch block   CV:  Past Medical History:  Diagnosis Date  . Anemia    during  pregnancy  . Anxiety   . Arthritis   . Cervical spinal stenosis   . Chronic back pain   . Dry skin   . Eczema   . Fatty tumor    Right wrist  . Frequent UTI   . GERD (gastroesophageal reflux disease)   . High cholesterol   . History of blood in urine    Pt was seen by [facility]; scant amount; no current issues  . Hypertension   . Hypothyroidism   . Lumbar spinal stenosis   . Numbness and tingling in hands    Bilateral  . Pneumonia   . Proteinuria 2020   seeing a kidney specialist  . Thyroid condition     Past Surgical History:  Procedure Laterality Date  . APPENDECTOMY    . BREAST BIOPSY Left 2006   benign  . BREAST EXCISIONAL BIOPSY Left 2006   papilloma  . CERVICAL SPINE SURGERY  01/28/2009  . COLONOSCOPY W/ POLYPECTOMY    . EYE SURGERY Bilateral 01/2017   cataract surgery  . LUMBAR LAMINECTOMY/DECOMPRESSION MICRODISCECTOMY N/A 05/10/2017   Procedure: LAMINECTOMY LUMBAR 5- SACRAL 1;  Surgeon: Ashok Pall, MD;  Location: [facility];  Service: Neurosurgery;  Laterality: N/A;  LAMINECTOMY LUMBAR 5- SACRAL 1  . LUMBAR SPINE SURGERY  2018  . THYROIDECTOMY  1971  . TONSILLECTOMY    . TOTAL ABDOMINAL HYSTERECTOMY  1986    MEDICATIONS: . acetaminophen (  TYLENOL) 650 MG CR tablet  . amLODipine (NORVASC) 5 MG tablet  . aspirin EC 81 MG tablet  . CALCIUM-MAGNESIUM PO  . clobetasol ointment (TEMOVATE) 0.05 %  . colesevelam (WELCHOL) 625 MG tablet  . DULoxetine (CYMBALTA) 60 MG capsule  . famotidine (PEPCID) 40 MG tablet  . furosemide (LASIX) 20 MG tablet  . gemfibrozil (LOPID) 600 MG tablet  . ibuprofen (ADVIL) 800 MG tablet  . levothyroxine (SYNTHROID, LEVOTHROID) 100 MCG tablet  . metoprolol succinate (TOPROL-XL) 100 MG 24 hr tablet  . Multiple Vitamin (MULTIVITAMIN WITH MINERALS) TABS tablet  . pregabalin (LYRICA) 75 MG capsule  . quinapril-hydrochlorothiazide (ACCURETIC) 20-25 MG per tablet  . raloxifene (EVISTA) 60 MG tablet   No current  facility-administered medications for this encounter.   Maia Plan [facility] Pre-Surgical Testing [phone] 03/04/19  4:12 PM

## 2019-03-04 NOTE — Progress Notes (Signed)
PCP - Dr. Alyson Ingles Cardiologist - none  Chest x-ray - no EKG - 02/25/19 Stress Test - no ECHO -no  Cardiac Cath - no  Sleep Study - no CPAP -   Fasting Blood Sugar - NA Checks Blood Sugar _____ times a day  Blood Thinner Instructions:ASA stop 5 days prior to DOS by Dr. Alyson Ingles Aspirin Instructions: Last Dose:03/04/19  Anesthesia review:   Patient denies shortness of breath, fever, cough and chest pain at PAT appointment yes  Patient verbalized understanding of instructions that were given to them at the PAT appointment. Patient was also instructed that they will need to review over the PAT instructions again at home before surgery. yes

## 2019-03-04 NOTE — Anesthesia Preprocedure Evaluation (Addendum)
Anesthesia Evaluation  Patient identified by MRN, date of birth, ID band Patient awake    Reviewed: Allergy & Precautions, NPO status , Patient's Chart, lab work & pertinent test results, reviewed documented beta blocker date and time   History of Anesthesia Complications Negative for: history of anesthetic complications  Airway Mallampati: III  TM Distance: >3 FB Neck ROM: Full    Dental  (+) Dental Advisory Given   Pulmonary former smoker,    breath sounds clear to auscultation       Cardiovascular hypertension, Pt. on medications and Pt. on home beta blockers (-) angina(-) Past MI and (-) CHF  Rhythm:Regular     Neuro/Psych PSYCHIATRIC DISORDERS Anxiety C 3-4 stenosis    GI/Hepatic GERD  Medicated and Controlled,  Endo/Other  Hypothyroidism   Renal/GU      Musculoskeletal  (+) Arthritis ,   Abdominal   Peds  Hematology Plt 358 NO anticoags   Anesthesia Other Findings   Reproductive/Obstetrics                           Anesthesia Physical Anesthesia Plan  ASA: III  Anesthesia Plan: MAC, Regional and Spinal   Post-op Pain Management:  Regional for Post-op pain   Induction: Intravenous  PONV Risk Score and Plan: 2 and Treatment may vary due to age or medical condition and Propofol infusion  Airway Management Planned: Nasal Cannula  Additional Equipment: None  Intra-op Plan:   Post-operative Plan:   Informed Consent: I have reviewed the patients History and Physical, chart, labs and discussed the procedure including the risks, benefits and alternatives for the proposed anesthesia with the patient or authorized representative who has indicated his/her understanding and acceptance.     Dental advisory given  Plan Discussed with: CRNA and Surgeon  Anesthesia Plan Comments: (See PAT note 03/04/19, Konrad Felix, PA-C)       Anesthesia Quick Evaluation

## 2019-03-05 ENCOUNTER — Other Ambulatory Visit (HOSPITAL_COMMUNITY): Payer: Medicare Other

## 2019-03-05 ENCOUNTER — Encounter: Payer: Self-pay | Admitting: Neurology

## 2019-03-05 ENCOUNTER — Encounter: Payer: Medicare Other | Admitting: Neurology

## 2019-03-05 NOTE — H&P (Signed)
TOTAL KNEE ADMISSION H&P  Patient is being admitted for right total knee arthroplasty.  Subjective:  Chief Complaint:right knee pain.  HPI: Ashley Mercer, 78 y.o. female, has a history of pain and functional disability in the right knee due to arthritis and has failed non-surgical conservative treatments for greater than 12 weeks to includeNSAID's and/or analgesics, corticosteriod injections, flexibility and strengthening excercises, use of assistive devices and activity modification.  Onset of symptoms was gradual, starting 8 years ago with gradually worsening course since that time. The patient noted no past surgery on the right knee(s).  Patient currently rates pain in the right knee(s) at 7 out of 10 with activity. Patient has night pain, worsening of pain with activity and weight bearing, pain that interferes with activities of daily living, pain with passive range of motion, crepitus and joint swelling.  Patient has evidence of periarticular osteophytes and joint space narrowing by imaging studies.  There is no active infection.  Patient Active Problem List   Diagnosis Date Noted  . Lumbar stenosis with neurogenic claudication 05/10/2017  . Spondylolisthesis of lumbar region 05/14/2014  . Spinal stenosis of lumbar region 10/14/2012  . Spondylosis, cervical, with myelopathy 10/14/2012  . Gait difficulty 10/14/2012   Past Medical History:  Diagnosis Date  . Anemia    during pregnancy  . Anxiety   . Arthritis   . Cervical spinal stenosis   . Chronic back pain   . Dry skin   . Eczema   . Fatty tumor    Right wrist  . Frequent UTI   . GERD (gastroesophageal reflux disease)   . High cholesterol   . History of blood in urine    Pt was seen by [facility]; scant amount; no current issues  . Hypertension   . Hypothyroidism   . Lumbar spinal stenosis   . Numbness and tingling in hands    Bilateral  . Pneumonia   . Proteinuria 2020   seeing a kidney specialist  . Thyroid  condition     Past Surgical History:  Procedure Laterality Date  . APPENDECTOMY    . BREAST BIOPSY Left 2006   benign  . BREAST EXCISIONAL BIOPSY Left 2006   papilloma  . CERVICAL SPINE SURGERY  01/28/2009  . COLONOSCOPY W/ POLYPECTOMY    . EYE SURGERY Bilateral 01/2017   cataract surgery  . LUMBAR LAMINECTOMY/DECOMPRESSION MICRODISCECTOMY N/A 05/10/2017   Procedure: LAMINECTOMY LUMBAR 5- SACRAL 1;  Surgeon: Ashok Pall, MD;  Location: [facility];  Service: Neurosurgery;  Laterality: N/A;  LAMINECTOMY LUMBAR 5- SACRAL 1  . LUMBAR SPINE SURGERY  2018  . THYROIDECTOMY  1971  . TONSILLECTOMY    . TOTAL ABDOMINAL HYSTERECTOMY  1986       Current Outpatient Medications  Medication Sig Dispense Refill Last Dose  . acetaminophen (TYLENOL) 650 MG CR tablet Take 1,300 mg by mouth every 8 (eight) hours as needed for pain.     Marland Kitchen amLODipine (NORVASC) 5 MG tablet Take 5 mg by mouth daily.      Marland Kitchen aspirin EC 81 MG tablet Take 81 mg by mouth daily.     Marland Kitchen CALCIUM-MAGNESIUM PO Take 1 tablet by mouth daily.     . clobetasol ointment (TEMOVATE) [id number] % Apply 1 application topically daily as needed (eczema).      . colesevelam (WELCHOL) 625 MG tablet Take 1,250 mg by mouth 2 (two) times daily with a meal.     . DULoxetine (CYMBALTA) 60 MG capsule Take  60 mg by mouth at bedtime.      . famotidine (PEPCID) 40 MG tablet Take 40 mg by mouth at bedtime.     . furosemide (LASIX) 20 MG tablet Take 20 mg by mouth daily.      Marland Kitchen gemfibrozil (LOPID) 600 MG tablet Take 600 mg by mouth 2 (two) times daily.      Marland Kitchen ibuprofen (ADVIL) 800 MG tablet Take 800 mg by mouth every 8 (eight) hours as needed for moderate pain.     Marland Kitchen levothyroxine (SYNTHROID, LEVOTHROID) 100 MCG tablet Take 100 mcg by mouth daily before breakfast.     . metoprolol succinate (TOPROL-XL) 100 MG 24 hr tablet Take 100 mg by mouth daily.   0   . Multiple Vitamin (MULTIVITAMIN WITH MINERALS) TABS tablet Take 1 tablet by mouth daily.     . pregabalin  (LYRICA) 75 MG capsule Take 75 mg by mouth 2 (two) times daily.     . quinapril-hydrochlorothiazide (ACCURETIC) 20-25 MG per tablet Take 1 tablet by mouth daily.     . raloxifene (EVISTA) 60 MG tablet Take 60 mg by mouth daily.      No Known Allergies  Social History   Tobacco Use  . Smoking status: Former Smoker    Packs/day: 0.25    Years: 10.00    Pack years: 2.50    Types: Cigarettes    Quit date: 03/03/1988    Years since quitting: 31.0  . Smokeless tobacco: Never Used  Substance Use Topics  . Alcohol use: Yes    Comment: quit in 1997; wine occasionally    Family History  Problem Relation Age of Onset  . Heart disease Mother   . Diabetes Mother   . Cancer Maternal Grandfather   . Breast cancer Neg Hx      Review of Systems  Constitutional: Negative.   HENT: Negative.   Eyes: Negative.   Respiratory: Negative.   Cardiovascular: Negative.   Gastrointestinal: Negative.   Endocrine: Negative.   Musculoskeletal: Positive for arthralgias, joint swelling and myalgias. Negative for back pain, gait problem, neck pain and neck stiffness.  Skin: Negative.   Allergic/Immunologic: Negative.   Neurological: Negative.   Hematological: Negative.   Psychiatric/Behavioral: Negative.     Objective:  Physical Exam  Constitutional: She is oriented to person, place, and time. She appears well-developed. No distress.  Morbidly bese  HENT:  Head: Normocephalic and atraumatic.  Right Ear: External ear normal.  Left Ear: External ear normal.  Nose: Nose normal.  Mouth/Throat: Oropharynx is clear and moist.  Eyes: Conjunctivae and EOM are normal.  Cardiovascular: Normal rate, regular rhythm, normal heart sounds and intact distal pulses.  No murmur heard. Respiratory: Effort normal and breath sounds normal. No respiratory distress. She has no wheezes.  GI: Soft. She exhibits no distension. There is no abdominal tenderness.  Musculoskeletal:     Cervical back: Normal range of  motion and neck supple.     Comments: Bilateral Hip Exam: The range of motion: normal without discomfort.  Right Knee Exam: No effusion. Valgus deformity. Range of motion is 5 to 100 degrees. Positive medial and lateral joint line tenderness. Stable knee.  Left Knee Exam: No effusion. Slight varus deformity. Range of motion is 5 to 105 degrees. Moderate crepitus on range of motion of the knee. Positive medial, greater than lateral, joint line tenderness. Stable knee.  Neurological: She is alert and oriented to person, place, and time. She has normal strength. No sensory deficit.  Skin: No rash noted. She is not diaphoretic. No erythema.  Psychiatric: She has a normal mood and affect. Her behavior is normal.    Ht: 5 ft 2 in  Wt: 215  BMI: 39.3 BP: 124/82 sitting L arm  Pulse: 72 bpm regular   Imaging Review Plain radiographs demonstrate severe degenerative joint disease of the right knee(s). The overall alignment ismild varus. The bone quality appears to be good for age and reported activity level.    Assessment/Plan:  End stage primary osteoarthritis, right knee   The patient history, physical examination, clinical judgment of the provider and imaging studies are consistent with end stage degenerative joint disease of the right knee(s) and total knee arthroplasty is deemed medically necessary. The treatment options including medical management, injection therapy arthroscopy and arthroplasty were discussed at length. The risks and benefits of total knee arthroplasty were presented and reviewed. The risks due to aseptic loosening, infection, stiffness, patella tracking problems, thromboembolic complications and other imponderables were discussed. The patient acknowledged the explanation, agreed to proceed with the plan and consent was signed. Patient is being admitted for inpatient treatment for surgery, pain control, PT, OT, prophylactic antibiotics, VTE prophylaxis, progressive  ambulation and ADL's and discharge planning. The patient is planning to be discharged home .    Anticipated LOS equal to or greater than 2 midnights due to - Age 63 and older with one or more of the following:  - Obesity  - Expected need for hospital services (PT, OT, Nursing) required for safe  discharge  - Anticipated need for postoperative skilled nursing care or inpatient rehab  - Active co-morbidities: None OR   - Unanticipated findings during/Post Surgery: None  - Patient is a high risk of re-admission due to: None    Risks and benefits of the surgery were discussed with the patient and Dr.Aluisio at their previous office visit, and the patient has elected to move forward with the aforementioned surgery. Post-operative care plans were discussed with the patient today.    Therapy Plans: outpatient therapy at [facility] Disposition: Home with daughters Planned DVT prophylaxis: aspirin 325mg  BID DME needed: none PCP: Dr. Maury Dus- clearance received Other: has hardware THROUGHOUT spine- cervical through lumbar No anesthesia concerns  Instructed patient on meds to stop prior to surgery   Ardeen Jourdain, PA-C

## 2019-03-06 ENCOUNTER — Other Ambulatory Visit: Payer: Self-pay

## 2019-03-06 ENCOUNTER — Other Ambulatory Visit (HOSPITAL_COMMUNITY)
Admission: RE | Admit: 2019-03-06 | Discharge: 2019-03-06 | Disposition: A | Discharge status: Home / Self Care | Payer: Medicare Other | Source: Ambulatory Visit | Attending: Orthopedic Surgery | Admitting: Orthopedic Surgery

## 2019-03-06 DIAGNOSIS — Z20822 Contact with and (suspected) exposure to covid-19: Secondary | ICD-10-CM | POA: Diagnosis not present

## 2019-03-06 DIAGNOSIS — Z01812 Encounter for preprocedural laboratory examination: Secondary | ICD-10-CM | POA: Insufficient documentation

## 2019-03-06 LAB — SARS CORONAVIRUS 2 (TAT 6-24 HRS): SARS Coronavirus 2: NEGATIVE

## 2019-03-08 MED ORDER — BUPIVACAINE LIPOSOME 1.3 % IJ SUSP
20.0000 mL | INTRAMUSCULAR | Status: DC
  Filled 2019-03-08: qty 20

## 2019-03-09 ENCOUNTER — Inpatient Hospital Stay (HOSPITAL_COMMUNITY)
Admission: RE | Admit: 2019-03-09 | Discharge: 2019-03-11 | DRG: 470 | Disposition: A | Discharge status: Home Health | Payer: Medicare Other | Attending: Orthopedic Surgery | Admitting: Orthopedic Surgery

## 2019-03-09 ENCOUNTER — Encounter (HOSPITAL_COMMUNITY)
Admission: RE | Disposition: A | Discharge status: Home Health | Payer: Self-pay | Source: Home / Self Care | Attending: Orthopedic Surgery

## 2019-03-09 ENCOUNTER — Encounter (HOSPITAL_COMMUNITY): Payer: Self-pay | Admitting: Orthopedic Surgery

## 2019-03-09 ENCOUNTER — Ambulatory Visit (HOSPITAL_COMMUNITY): Payer: Medicare Other | Admitting: Certified Registered"

## 2019-03-09 ENCOUNTER — Ambulatory Visit (HOSPITAL_COMMUNITY): Payer: Medicare Other | Admitting: Physician Assistant

## 2019-03-09 DIAGNOSIS — F419 Anxiety disorder, unspecified: Secondary | ICD-10-CM | POA: Diagnosis not present

## 2019-03-09 DIAGNOSIS — Z20822 Contact with and (suspected) exposure to covid-19: Secondary | ICD-10-CM | POA: Diagnosis present

## 2019-03-09 DIAGNOSIS — M48062 Spinal stenosis, lumbar region with neurogenic claudication: Secondary | ICD-10-CM | POA: Diagnosis present

## 2019-03-09 DIAGNOSIS — Z79899 Other long term (current) drug therapy: Secondary | ICD-10-CM

## 2019-03-09 DIAGNOSIS — Z90710 Acquired absence of both cervix and uterus: Secondary | ICD-10-CM | POA: Diagnosis not present

## 2019-03-09 DIAGNOSIS — Z96651 Presence of right artificial knee joint: Secondary | ICD-10-CM

## 2019-03-09 DIAGNOSIS — E890 Postprocedural hypothyroidism: Secondary | ICD-10-CM | POA: Diagnosis present

## 2019-03-09 DIAGNOSIS — E669 Obesity, unspecified: Secondary | ICD-10-CM | POA: Diagnosis not present

## 2019-03-09 DIAGNOSIS — L309 Dermatitis, unspecified: Secondary | ICD-10-CM | POA: Diagnosis not present

## 2019-03-09 DIAGNOSIS — M62838 Other muscle spasm: Secondary | ICD-10-CM | POA: Diagnosis not present

## 2019-03-09 DIAGNOSIS — Z87440 Personal history of urinary (tract) infections: Secondary | ICD-10-CM

## 2019-03-09 DIAGNOSIS — M25761 Osteophyte, right knee: Secondary | ICD-10-CM | POA: Diagnosis present

## 2019-03-09 DIAGNOSIS — I10 Essential (primary) hypertension: Secondary | ICD-10-CM | POA: Diagnosis present

## 2019-03-09 DIAGNOSIS — Z7982 Long term (current) use of aspirin: Secondary | ICD-10-CM

## 2019-03-09 DIAGNOSIS — Z6839 Body mass index (BMI) 39.0-39.9, adult: Secondary | ICD-10-CM

## 2019-03-09 DIAGNOSIS — Z8249 Family history of ischemic heart disease and other diseases of the circulatory system: Secondary | ICD-10-CM

## 2019-03-09 DIAGNOSIS — M1711 Unilateral primary osteoarthritis, right knee: Principal | ICD-10-CM | POA: Diagnosis present

## 2019-03-09 DIAGNOSIS — K219 Gastro-esophageal reflux disease without esophagitis: Secondary | ICD-10-CM | POA: Diagnosis not present

## 2019-03-09 DIAGNOSIS — Z87891 Personal history of nicotine dependence: Secondary | ICD-10-CM

## 2019-03-09 DIAGNOSIS — Z833 Family history of diabetes mellitus: Secondary | ICD-10-CM

## 2019-03-09 DIAGNOSIS — M179 Osteoarthritis of knee, unspecified: Secondary | ICD-10-CM | POA: Diagnosis present

## 2019-03-09 DIAGNOSIS — M1710 Unilateral primary osteoarthritis, unspecified knee: Secondary | ICD-10-CM | POA: Diagnosis present

## 2019-03-09 DIAGNOSIS — Z79890 Hormone replacement therapy (postmenopausal): Secondary | ICD-10-CM

## 2019-03-09 DIAGNOSIS — G8918 Other acute postprocedural pain: Secondary | ICD-10-CM | POA: Diagnosis not present

## 2019-03-09 DIAGNOSIS — E7800 Pure hypercholesterolemia, unspecified: Secondary | ICD-10-CM | POA: Diagnosis not present

## 2019-03-09 HISTORY — PX: TOTAL KNEE ARTHROPLASTY: SHX125

## 2019-03-09 LAB — COMPREHENSIVE METABOLIC PANEL
ALT: 16 U/L (ref 0–44)
AST: 18 U/L (ref 15–41)
Albumin: 3.1 g/dL — ABNORMAL LOW (ref 3.5–5.0)
Alkaline Phosphatase: 59 U/L (ref 38–126)
Anion gap: 8 (ref 5–15)
BUN: 12 mg/dL (ref 8–23)
CO2: 27 mmol/L (ref 22–32)
Calcium: 8.5 mg/dL — ABNORMAL LOW (ref 8.9–10.3)
Chloride: 105 mmol/L (ref 98–111)
Creatinine, Ser: 0.74 mg/dL (ref 0.44–1.00)
GFR calc Af Amer: >60 mL/min (ref >60)
GFR calc non Af Amer: >60 mL/min (ref >60)
Glucose, Bld: 82 mg/dL (ref 70–99)
Potassium: 3.3 mmol/L — ABNORMAL LOW (ref 3.5–5.1)
Sodium: 140 mmol/L (ref 135–145)
Total Bilirubin: 0.4 mg/dL (ref 0.3–1.2)
Total Protein: 6.1 g/dL — ABNORMAL LOW (ref 6.5–8.1)

## 2019-03-09 LAB — CBC
HCT: 39.5 % (ref 36.0–46.0)
Hemoglobin: 12 g/dL (ref 12.0–15.0)
MCH: 26.5 pg (ref 26.0–34.0)
MCHC: 30.4 g/dL (ref 30.0–36.0)
MCV: 87.4 fL (ref 80.0–100.0)
Platelets: 334 10*3/uL (ref 150–400)
RBC: 4.52 MIL/uL (ref 3.87–5.11)
RDW: 15.1 % (ref 11.5–15.5)
WBC: 10 10*3/uL (ref 4.0–10.5)
nRBC: 0 % (ref 0.0–0.2)

## 2019-03-09 LAB — ABO/RH: ABO/RH(D): A NEG

## 2019-03-09 LAB — TYPE AND SCREEN
ABO/RH(D): A NEG
Antibody Screen: NEGATIVE

## 2019-03-09 LAB — PROTIME-INR
INR: 1 (ref 0.8–1.2)
Prothrombin Time: 13.3 seconds (ref 11.4–15.2)

## 2019-03-09 LAB — APTT: aPTT: 32 seconds (ref 24–36)

## 2019-03-09 SURGERY — ARTHROPLASTY, KNEE, TOTAL
Anesthesia: Monitor Anesthesia Care | Site: Knee | Laterality: Right

## 2019-03-09 MED ORDER — SODIUM CHLORIDE (PF) 0.9 % IJ SOLN
INTRAMUSCULAR | Status: AC
  Filled 2019-03-09: qty 50

## 2019-03-09 MED ORDER — FLEET ENEMA 7-19 GM/118ML RE ENEM: 1.0000 | ENEMA | Freq: Once | RECTAL | Status: DC | PRN

## 2019-03-09 MED ORDER — LACTATED RINGERS IV SOLN: INTRAVENOUS | Status: DC

## 2019-03-09 MED ORDER — FAMOTIDINE 20 MG PO TABS
40.0000 mg | ORAL_TABLET | Freq: Every day | ORAL | Status: DC
  Administered 2019-03-09 – 2019-03-10 (×2): 40 mg via ORAL
  Filled 2019-03-09 (×2): qty 2

## 2019-03-09 MED ORDER — ONDANSETRON HCL 4 MG/2ML IJ SOLN: 4.0000 mg | Freq: Four times a day (QID) | INTRAMUSCULAR | Status: DC | PRN

## 2019-03-09 MED ORDER — POLYETHYLENE GLYCOL 3350 17 G PO PACK: 17.0000 g | PACK | Freq: Every day | ORAL | Status: DC | PRN

## 2019-03-09 MED ORDER — BUPIVACAINE IN DEXTROSE 0.75-8.25 % IT SOLN
INTRATHECAL | Status: DC | PRN
  Administered 2019-03-09: 1.8 mL via INTRATHECAL

## 2019-03-09 MED ORDER — MIDAZOLAM HCL 2 MG/2ML IJ SOLN: 1.0000 mg | Freq: Once | INTRAMUSCULAR | Status: DC

## 2019-03-09 MED ORDER — LIDOCAINE 2% (20 MG/ML) 5 ML SYRINGE
INTRAMUSCULAR | Status: DC | PRN
  Administered 2019-03-09: 40 mg via INTRAVENOUS

## 2019-03-09 MED ORDER — METOCLOPRAMIDE HCL 5 MG/ML IJ SOLN: 5.0000 mg | Freq: Three times a day (TID) | INTRAMUSCULAR | Status: DC | PRN

## 2019-03-09 MED ORDER — OXYCODONE HCL 5 MG/5ML PO SOLN: 5.0000 mg | Freq: Once | ORAL | Status: DC | PRN

## 2019-03-09 MED ORDER — DEXAMETHASONE SODIUM PHOSPHATE 10 MG/ML IJ SOLN
8.0000 mg | Freq: Once | INTRAMUSCULAR | Status: AC
  Administered 2019-03-09: 8 mg via INTRAVENOUS

## 2019-03-09 MED ORDER — DOCUSATE SODIUM 100 MG PO CAPS
100.0000 mg | ORAL_CAPSULE | Freq: Two times a day (BID) | ORAL | Status: DC
  Administered 2019-03-09 – 2019-03-11 (×4): 100 mg via ORAL
  Filled 2019-03-09 (×4): qty 1

## 2019-03-09 MED ORDER — ONDANSETRON HCL 4 MG/2ML IJ SOLN
INTRAMUSCULAR | Status: AC
  Filled 2019-03-09: qty 2

## 2019-03-09 MED ORDER — MIDAZOLAM HCL 2 MG/2ML IJ SOLN
INTRAMUSCULAR | Status: AC
  Filled 2019-03-09: qty 2

## 2019-03-09 MED ORDER — SODIUM CHLORIDE (PF) 0.9 % IJ SOLN
INTRAMUSCULAR | Status: DC | PRN
  Administered 2019-03-09: 60 mL

## 2019-03-09 MED ORDER — PHENOL 1.4 % MT LIQD
1.0000 | OROMUCOSAL | Status: DC | PRN
  Filled 2019-03-09: qty 177

## 2019-03-09 MED ORDER — CEFAZOLIN SODIUM-DEXTROSE 2-4 GM/100ML-% IV SOLN
2.0000 g | Freq: Four times a day (QID) | INTRAVENOUS | Status: AC
  Administered 2019-03-09 (×2): 2 g via INTRAVENOUS
  Filled 2019-03-09 (×2): qty 100

## 2019-03-09 MED ORDER — EPHEDRINE 5 MG/ML INJ
INTRAVENOUS | Status: AC
  Filled 2019-03-09: qty 10

## 2019-03-09 MED ORDER — PHENYLEPHRINE HCL (PRESSORS) 10 MG/ML IV SOLN
INTRAVENOUS | Status: AC
  Filled 2019-03-09: qty 1

## 2019-03-09 MED ORDER — OXYCODONE HCL 5 MG PO TABS: 5.0000 mg | ORAL_TABLET | Freq: Once | ORAL | Status: DC | PRN

## 2019-03-09 MED ORDER — DEXAMETHASONE SODIUM PHOSPHATE 10 MG/ML IJ SOLN
10.0000 mg | Freq: Once | INTRAMUSCULAR | Status: AC
  Administered 2019-03-10: 10:00:00 10 mg via INTRAVENOUS
  Filled 2019-03-09: qty 1

## 2019-03-09 MED ORDER — DIPHENHYDRAMINE HCL 12.5 MG/5ML PO ELIX: 12.5000 mg | ORAL_SOLUTION | ORAL | Status: DC | PRN

## 2019-03-09 MED ORDER — FENTANYL CITRATE (PF) 100 MCG/2ML IJ SOLN: 50.0000 ug | Freq: Once | INTRAMUSCULAR | Status: AC

## 2019-03-09 MED ORDER — DEXAMETHASONE SODIUM PHOSPHATE 10 MG/ML IJ SOLN
INTRAMUSCULAR | Status: AC
  Filled 2019-03-09: qty 1

## 2019-03-09 MED ORDER — ROPIVACAINE HCL 7.5 MG/ML IJ SOLN
INTRAMUSCULAR | Status: DC | PRN
  Administered 2019-03-09: 20 mL via PERINEURAL

## 2019-03-09 MED ORDER — LEVOTHYROXINE SODIUM 100 MCG PO TABS
100.0000 ug | ORAL_TABLET | Freq: Every day | ORAL | Status: DC
  Administered 2019-03-10 – 2019-03-11 (×2): 100 ug via ORAL
  Filled 2019-03-09 (×2): qty 1

## 2019-03-09 MED ORDER — ASPIRIN EC 325 MG PO TBEC
325.0000 mg | DELAYED_RELEASE_TABLET | Freq: Two times a day (BID) | ORAL | Status: DC
  Administered 2019-03-10 – 2019-03-11 (×3): 325 mg via ORAL
  Filled 2019-03-09 (×3): qty 1

## 2019-03-09 MED ORDER — PROPOFOL 10 MG/ML IV BOLUS
INTRAVENOUS | Status: DC | PRN
  Administered 2019-03-09: 20 mg via INTRAVENOUS

## 2019-03-09 MED ORDER — METHOCARBAMOL 500 MG PO TABS
500.0000 mg | ORAL_TABLET | Freq: Four times a day (QID) | ORAL | Status: DC | PRN
  Administered 2019-03-09: 500 mg via ORAL
  Filled 2019-03-09: qty 1

## 2019-03-09 MED ORDER — PHENYLEPHRINE HCL-NACL 10-0.9 MG/250ML-% IV SOLN
INTRAVENOUS | Status: DC | PRN
  Administered 2019-03-09: 20 ug/min via INTRAVENOUS

## 2019-03-09 MED ORDER — EPHEDRINE SULFATE-NACL 50-0.9 MG/10ML-% IV SOSY
PREFILLED_SYRINGE | INTRAVENOUS | Status: DC | PRN
  Administered 2019-03-09: 5 mg via INTRAVENOUS
  Administered 2019-03-09 (×4): 10 mg via INTRAVENOUS

## 2019-03-09 MED ORDER — SODIUM CHLORIDE 0.9 % IR SOLN
Status: DC | PRN
  Administered 2019-03-09: 1000 mL

## 2019-03-09 MED ORDER — LIDOCAINE 2% (20 MG/ML) 5 ML SYRINGE
INTRAMUSCULAR | Status: AC
  Filled 2019-03-09: qty 5

## 2019-03-09 MED ORDER — POVIDONE-IODINE 10 % EX SWAB
2.0000 "application " | Freq: Once | CUTANEOUS | Status: AC
  Administered 2019-03-09: 2 via TOPICAL

## 2019-03-09 MED ORDER — ACETAMINOPHEN 10 MG/ML IV SOLN
1000.0000 mg | Freq: Four times a day (QID) | INTRAVENOUS | Status: DC
  Administered 2019-03-09: 1000 mg via INTRAVENOUS
  Filled 2019-03-09: qty 100

## 2019-03-09 MED ORDER — ACETAMINOPHEN 10 MG/ML IV SOLN: 1000.0000 mg | Freq: Once | INTRAVENOUS | Status: DC | PRN

## 2019-03-09 MED ORDER — ACETAMINOPHEN 500 MG PO TABS: 1000.0000 mg | ORAL_TABLET | Freq: Once | ORAL | Status: DC | PRN

## 2019-03-09 MED ORDER — RALOXIFENE HCL 60 MG PO TABS
60.0000 mg | ORAL_TABLET | Freq: Every day | ORAL | Status: DC
  Administered 2019-03-10 – 2019-03-11 (×2): 60 mg via ORAL
  Filled 2019-03-09 (×2): qty 1

## 2019-03-09 MED ORDER — AMLODIPINE BESYLATE 5 MG PO TABS
5.0000 mg | ORAL_TABLET | Freq: Every day | ORAL | Status: DC
  Administered 2019-03-10: 11:00:00 5 mg via ORAL
  Filled 2019-03-09 (×2): qty 1

## 2019-03-09 MED ORDER — METOCLOPRAMIDE HCL 5 MG PO TABS: 5.0000 mg | ORAL_TABLET | Freq: Three times a day (TID) | ORAL | Status: DC | PRN

## 2019-03-09 MED ORDER — ACETAMINOPHEN 160 MG/5ML PO SOLN: 1000.0000 mg | Freq: Once | ORAL | Status: DC | PRN

## 2019-03-09 MED ORDER — 0.9 % SODIUM CHLORIDE (POUR BTL) OPTIME
TOPICAL | Status: DC | PRN
  Administered 2019-03-09: 13:00:00 1000 mL

## 2019-03-09 MED ORDER — BUPIVACAINE LIPOSOME 1.3 % IJ SUSP
INTRAMUSCULAR | Status: DC | PRN
  Administered 2019-03-09: 20 mL

## 2019-03-09 MED ORDER — MORPHINE SULFATE (PF) 4 MG/ML IV SOLN: 0.5000 mg | INTRAVENOUS | Status: DC | PRN

## 2019-03-09 MED ORDER — CEFAZOLIN SODIUM-DEXTROSE 2-4 GM/100ML-% IV SOLN
2.0000 g | INTRAVENOUS | Status: AC
  Administered 2019-03-09: 2 g via INTRAVENOUS
  Filled 2019-03-09: qty 100

## 2019-03-09 MED ORDER — STERILE WATER FOR IRRIGATION IR SOLN
Status: DC | PRN
  Administered 2019-03-09: 2000 mL

## 2019-03-09 MED ORDER — PROPOFOL 1000 MG/100ML IV EMUL
INTRAVENOUS | Status: AC
  Filled 2019-03-09: qty 100

## 2019-03-09 MED ORDER — METOPROLOL SUCCINATE ER 50 MG PO TB24
100.0000 mg | ORAL_TABLET | Freq: Every day | ORAL | Status: DC
  Administered 2019-03-10: 100 mg via ORAL
  Filled 2019-03-09 (×2): qty 2

## 2019-03-09 MED ORDER — GEMFIBROZIL 600 MG PO TABS
600.0000 mg | ORAL_TABLET | Freq: Two times a day (BID) | ORAL | Status: DC
  Administered 2019-03-09 – 2019-03-11 (×4): 600 mg via ORAL
  Filled 2019-03-09 (×5): qty 1

## 2019-03-09 MED ORDER — OXYCODONE HCL 5 MG PO TABS
10.0000 mg | ORAL_TABLET | ORAL | Status: DC | PRN
  Administered 2019-03-09 – 2019-03-11 (×5): 10 mg via ORAL
  Filled 2019-03-09 (×5): qty 2

## 2019-03-09 MED ORDER — DULOXETINE HCL 60 MG PO CPEP
60.0000 mg | ORAL_CAPSULE | Freq: Every day | ORAL | Status: DC
  Administered 2019-03-09 – 2019-03-10 (×2): 60 mg via ORAL
  Filled 2019-03-09 (×2): qty 1

## 2019-03-09 MED ORDER — TRANEXAMIC ACID-NACL 1000-0.7 MG/100ML-% IV SOLN
1000.0000 mg | INTRAVENOUS | Status: AC
  Administered 2019-03-09: 13:00:00 1000 mg via INTRAVENOUS
  Filled 2019-03-09: qty 100

## 2019-03-09 MED ORDER — FENTANYL CITRATE (PF) 100 MCG/2ML IJ SOLN
INTRAMUSCULAR | Status: AC
  Administered 2019-03-09: 12:00:00 50 ug via INTRAVENOUS
  Filled 2019-03-09: qty 2

## 2019-03-09 MED ORDER — PREGABALIN 75 MG PO CAPS
75.0000 mg | ORAL_CAPSULE | Freq: Two times a day (BID) | ORAL | Status: DC
  Administered 2019-03-09 – 2019-03-11 (×4): 75 mg via ORAL
  Filled 2019-03-09 (×4): qty 1

## 2019-03-09 MED ORDER — COLESEVELAM HCL 625 MG PO TABS
1250.0000 mg | ORAL_TABLET | Freq: Two times a day (BID) | ORAL | Status: DC
  Administered 2019-03-09 – 2019-03-11 (×4): 1250 mg via ORAL
  Filled 2019-03-09 (×5): qty 2

## 2019-03-09 MED ORDER — CHLORHEXIDINE GLUCONATE 4 % EX LIQD: 60.0000 mL | Freq: Once | CUTANEOUS | Status: DC

## 2019-03-09 MED ORDER — MENTHOL 3 MG MT LOZG: 1.0000 | LOZENGE | OROMUCOSAL | Status: DC | PRN

## 2019-03-09 MED ORDER — BISACODYL 10 MG RE SUPP: 10.0000 mg | Freq: Every day | RECTAL | Status: DC | PRN

## 2019-03-09 MED ORDER — ONDANSETRON HCL 4 MG PO TABS: 4.0000 mg | ORAL_TABLET | Freq: Four times a day (QID) | ORAL | Status: DC | PRN

## 2019-03-09 MED ORDER — SODIUM CHLORIDE (PF) 0.9 % IJ SOLN
INTRAMUSCULAR | Status: AC
  Filled 2019-03-09: qty 10

## 2019-03-09 MED ORDER — ACETAMINOPHEN 500 MG PO TABS
1000.0000 mg | ORAL_TABLET | Freq: Four times a day (QID) | ORAL | Status: AC
  Administered 2019-03-10: 1000 mg via ORAL
  Filled 2019-03-09: qty 2

## 2019-03-09 MED ORDER — FUROSEMIDE 20 MG PO TABS
20.0000 mg | ORAL_TABLET | Freq: Every day | ORAL | Status: DC
  Administered 2019-03-10 – 2019-03-11 (×2): 20 mg via ORAL
  Filled 2019-03-09 (×2): qty 1

## 2019-03-09 MED ORDER — HYDROCHLOROTHIAZIDE 25 MG PO TABS
25.0000 mg | ORAL_TABLET | Freq: Every day | ORAL | Status: DC
  Administered 2019-03-10 – 2019-03-11 (×2): 25 mg via ORAL
  Filled 2019-03-09 (×2): qty 1

## 2019-03-09 MED ORDER — METHOCARBAMOL 500 MG IVPB - SIMPLE MED
500.0000 mg | Freq: Four times a day (QID) | INTRAVENOUS | Status: DC | PRN
  Filled 2019-03-09: qty 50

## 2019-03-09 MED ORDER — LIP MEDEX EX OINT
TOPICAL_OINTMENT | CUTANEOUS | Status: AC
  Filled 2019-03-09: qty 7

## 2019-03-09 MED ORDER — PROPOFOL 500 MG/50ML IV EMUL
INTRAVENOUS | Status: DC | PRN
  Administered 2019-03-09: 80 ug/kg/min via INTRAVENOUS

## 2019-03-09 MED ORDER — ONDANSETRON HCL 4 MG/2ML IJ SOLN
INTRAMUSCULAR | Status: DC | PRN
  Administered 2019-03-09: 4 mg via INTRAVENOUS

## 2019-03-09 MED ORDER — OXYCODONE HCL 5 MG PO TABS: 5.0000 mg | ORAL_TABLET | ORAL | Status: DC | PRN

## 2019-03-09 MED ORDER — SODIUM CHLORIDE 0.9 % IV SOLN: INTRAVENOUS | Status: DC

## 2019-03-09 MED ORDER — FENTANYL CITRATE (PF) 100 MCG/2ML IJ SOLN: 25.0000 ug | INTRAMUSCULAR | Status: DC | PRN

## 2019-03-09 SURGICAL SUPPLY — 61 items
BAG ZIPLOCK 12X15 (MISCELLANEOUS) ×2 IMPLANT
BLADE SAG 18X100X1.27 (BLADE) ×2 IMPLANT
BLADE SAW SGTL 11.0X1.19X90.0M (BLADE) ×2 IMPLANT
BLADE SURG SZ10 CARB STEEL (BLADE) ×4 IMPLANT
BNDG ELASTIC 6X5.8 VLCR STR LF (GAUZE/BANDAGES/DRESSINGS) ×2 IMPLANT
BOWL SMART MIX CTS (DISPOSABLE) ×2 IMPLANT
CEMENT HV SMART SET (Cement) ×4 IMPLANT
CEMENT TIBIA MBT (Knees) ×1 IMPLANT
COVER SURGICAL LIGHT HANDLE (MISCELLANEOUS) ×2 IMPLANT
COVER WAND RF STERILE (DRAPES) IMPLANT
CUFF TOURN SGL QUICK 34 (TOURNIQUET CUFF) ×1
CUFF TRNQT CYL 34X4.125X (TOURNIQUET CUFF) ×1 IMPLANT
DECANTER SPIKE VIAL GLASS SM (MISCELLANEOUS) ×2 IMPLANT
DRAPE U-SHAPE 47X51 STRL (DRAPES) ×2 IMPLANT
DRSG ADAPTIC 3X8 NADH LF (GAUZE/BANDAGES/DRESSINGS) ×2 IMPLANT
DRSG AQUACEL AG ADV 3.5X 6 (GAUZE/BANDAGES/DRESSINGS) IMPLANT
DRSG PAD ABDOMINAL 8X10 ST (GAUZE/BANDAGES/DRESSINGS) ×2 IMPLANT
DURAPREP 26ML APPLICATOR (WOUND CARE) ×2 IMPLANT
ELECT REM PT RETURN 15FT ADLT (MISCELLANEOUS) ×2 IMPLANT
EVACUATOR 1/8 PVC DRAIN (DRAIN) ×2 IMPLANT
FEMUR SIGMA PS SZ 3.0 R (Femur) ×2 IMPLANT
GAUZE SPONGE 2X2 8PLY STRL LF (GAUZE/BANDAGES/DRESSINGS) ×1 IMPLANT
GAUZE SPONGE 4X4 12PLY STRL (GAUZE/BANDAGES/DRESSINGS) ×2 IMPLANT
GLOVE BIO SURGEON STRL SZ7 (GLOVE) ×2 IMPLANT
GLOVE BIO SURGEON STRL SZ8 (GLOVE) ×2 IMPLANT
GLOVE BIOGEL PI IND STRL 6.5 (GLOVE) ×1 IMPLANT
GLOVE BIOGEL PI IND STRL 7.0 (GLOVE) ×1 IMPLANT
GLOVE BIOGEL PI IND STRL 8 (GLOVE) ×1 IMPLANT
GLOVE BIOGEL PI INDICATOR 6.5 (GLOVE) ×1
GLOVE BIOGEL PI INDICATOR 7.0 (GLOVE) ×1
GLOVE BIOGEL PI INDICATOR 8 (GLOVE) ×1
GLOVE SURG SS PI 6.5 STRL IVOR (GLOVE) ×2 IMPLANT
GOWN STRL REUS W/TWL LRG LVL3 (GOWN DISPOSABLE) ×6 IMPLANT
HANDPIECE INTERPULSE COAX TIP (DISPOSABLE) ×1
HOLDER FOLEY CATH W/STRAP (MISCELLANEOUS) ×2 IMPLANT
IMMOBILIZER KNEE 20 (SOFTGOODS) ×2
IMMOBILIZER KNEE 20 THIGH 36 (SOFTGOODS) ×1 IMPLANT
INSERT TIBIAL PFC SIG SZ3 10MM (Knees) ×2 IMPLANT
KIT TURNOVER KIT A (KITS) IMPLANT
MANIFOLD NEPTUNE II (INSTRUMENTS) ×2 IMPLANT
NS IRRIG 1000ML POUR BTL (IV SOLUTION) ×2 IMPLANT
PACK TOTAL KNEE CUSTOM (KITS) ×2 IMPLANT
PADDING CAST COTTON 6X4 STRL (CAST SUPPLIES) ×4 IMPLANT
PATELLA DOME PFC 35MM (Knees) ×2 IMPLANT
PENCIL SMOKE EVACUATOR (MISCELLANEOUS) ×2 IMPLANT
PIN STEINMAN FIXATION KNEE (PIN) ×2 IMPLANT
PROTECTOR NERVE ULNAR (MISCELLANEOUS) ×2 IMPLANT
SET HNDPC FAN SPRY TIP SCT (DISPOSABLE) ×1 IMPLANT
SPONGE GAUZE 2X2 STER 10/PKG (GAUZE/BANDAGES/DRESSINGS) ×1
STRIP CLOSURE SKIN 1/2X4 (GAUZE/BANDAGES/DRESSINGS) ×4 IMPLANT
SUT MNCRL AB 4-0 PS2 18 (SUTURE) ×2 IMPLANT
SUT STRATAFIX 0 PDS 27 VIOLET (SUTURE) ×2
SUT VIC AB 2-0 CT1 27 (SUTURE) ×3
SUT VIC AB 2-0 CT1 TAPERPNT 27 (SUTURE) ×3 IMPLANT
SUTURE STRATFX 0 PDS 27 VIOLET (SUTURE) ×1 IMPLANT
TIBIA MBT CEMENT (Knees) ×2 IMPLANT
TRAY FOLEY MTR SLVR 14FR STAT (SET/KITS/TRAYS/PACK) ×2 IMPLANT
TRAY FOLEY MTR SLVR 16FR STAT (SET/KITS/TRAYS/PACK) IMPLANT
WATER STERILE IRR 1000ML POUR (IV SOLUTION) ×4 IMPLANT
WRAP KNEE MAXI GEL POST OP (GAUZE/BANDAGES/DRESSINGS) ×2 IMPLANT
YANKAUER SUCT BULB TIP 10FT TU (MISCELLANEOUS) ×2 IMPLANT

## 2019-03-09 NOTE — Anesthesia Procedure Notes (Signed)
Spinal  Patient location during procedure: OR Start time: 03/09/2019 12:31 PM End time: 03/09/2019 12:37 PM Staffing Performed: anesthesiologist  Anesthesiologist: Oleta Mouse, MD Preanesthetic Checklist Completed: patient identified, IV checked, risks and benefits discussed, surgical consent, monitors and equipment checked, pre-op evaluation and timeout performed Spinal Block Patient position: sitting Prep: DuraPrep Patient monitoring: heart rate, cardiac monitor, continuous pulse ox and blood pressure Approach: midline Location: L4-5 Injection technique: single-shot Needle Needle type: Pencan  Needle gauge: 24 G Needle length: 9 cm Assessment Sensory level: T6

## 2019-03-09 NOTE — Op Note (Signed)
OPERATIVE REPORT-TOTAL KNEE ARTHROPLASTY   Pre-operative diagnosis- Osteoarthritis  Right knee(s)  Post-operative diagnosis- Osteoarthritis Right knee(s)  Procedure-  Right  Total Knee Arthroplasty  Surgeon- Dione Plover. Allexus Ovens, MD  Assistant- Theresa Duty, PA-C   Anesthesia-  Adductor canal block and spinal  EBL-50 mL   Drains Hemovac  Tourniquet time- 36 minutes @ [id number] mm Hg  Complications- None  Condition-PACU - hemodynamically stable.   Brief Clinical Note  Ashley Mercer is a 79 y.o. year old female with end stage OA of her right knee with progressively worsening pain and dysfunction. She has constant pain, with activity and at rest and significant functional deficits with difficulties even with ADLs. She has had extensive non-op management including analgesics, injections of cortisone and viscosupplements, and home exercise program, but remains in significant pain with significant dysfunction.Radiographs show bone on bone arthritis lateral and patellofemoral with valgus deformity. She presents now for right Total Knee Arthroplasty.    Procedure in detail---   The patient is brought into the operating room and positioned supine on the operating table. After successful administration of  Adductor canal block and spinal,   a tourniquet is placed high on the  Right thigh(s) and the lower extremity is prepped and draped in the usual sterile fashion. Time out is performed by the operating team and then the  Right lower extremity is wrapped in Esmarch, knee flexed and the tourniquet inflated to 300 mmHg.       A midline incision is made with a ten blade through the subcutaneous tissue to the level of the extensor mechanism. A fresh blade is used to make a medial parapatellar arthrotomy. Soft tissue over the proximal medial tibia is subperiosteally elevated to the joint line with a knife and into the semimembranosus bursa with a Cobb elevator. Soft tissue over the proximal lateral  tibia is elevated with attention being paid to avoiding the patellar tendon on the tibial tubercle. The patella is everted, knee flexed 90 degrees and the ACL and PCL are removed. Findings are bone on bone lateral and patellofemoral with massive global osteophytes.        The drill is used to create a starting hole in the distal femur and the canal is thoroughly irrigated with sterile saline to remove the fatty contents. The 5 degree Right  valgus alignment guide is placed into the femoral canal and the distal femoral cutting block is pinned to remove 10 mm off the distal femur. Resection is made with an oscillating saw.      The tibia is subluxed forward and the menisci are removed. The extramedullary alignment guide is placed referencing proximally at the medial aspect of the tibial tubercle and distally along the second metatarsal axis and tibial crest. The block is pinned to remove 54mm off the more deficient medial  side. Resection is made with an oscillating saw. Size 3is the most appropriate size for the tibia and the proximal tibia is prepared with the modular drill and keel punch for that size.      The femoral sizing guide is placed and size 3 is most appropriate. Rotation is marked off the epicondylar axis and confirmed by creating a rectangular flexion gap at 90 degrees. The size 3 cutting block is pinned in this rotation and the anterior, posterior and chamfer cuts are made with the oscillating saw. The intercondylar block is then placed and that cut is made.      Trial size 3 tibial component, trial size  3 posterior stabilized femur and a 10  mm posterior stabilized rotating platform insert trial is placed. Full extension is achieved with excellent varus/valgus and anterior/posterior balance throughout full range of motion. The patella is everted and thickness measured to be 22  mm. Free hand resection is taken to 12 mm, a 35 template is placed, lug holes are drilled, trial patella is placed, and it  tracks normally. Osteophytes are removed off the posterior femur with the trial in place. All trials are removed and the cut bone surfaces prepared with pulsatile lavage. Cement is mixed and once ready for implantation, the size 3 tibial implant, size  3 posterior stabilized femoral component, and the size 35 patella are cemented in place and the patella is held with the clamp. The trial insert is placed and the knee held in full extension. The Exparel (20 ml mixed with 60 ml saline) is injected into the extensor mechanism, posterior capsule, medial and lateral gutters and subcutaneous tissues.  All extruded cement is removed and once the cement is hard the permanent 10 mm posterior stabilized rotating platform insert is placed into the tibial tray.      The wound is copiously irrigated with saline solution and the extensor mechanism closed over a hemovac drain with #1 V-loc suture. The tourniquet is released for a total tourniquet time of 36  minutes. Flexion against gravity is 140 degrees and the patella tracks normally. Subcutaneous tissue is closed with 2.0 vicryl and subcuticular with running 4.0 Monocryl. The incision is cleaned and dried and steri-strips and a bulky sterile dressing are applied. The limb is placed into a knee immobilizer and the patient is awakened and transported to recovery in stable condition.      Please note that a surgical assistant was a medical necessity for this procedure in order to perform it in a safe and expeditious manner. Surgical assistant was necessary to retract the ligaments and vital neurovascular structures to prevent injury to them and also necessary for proper positioning of the limb to allow for anatomic placement of the prosthesis.   Dione Plover Sarrinah Gardin, MD    03/09/2019, 1:41 PM

## 2019-03-09 NOTE — Interval H&P Note (Signed)
History and Physical Interval Note:  03/09/2019 10:24 AM  Ashley Mercer  has presented today for surgery, with the diagnosis of RIGHT KNEE OSTEOARTHRITIS.  The various methods of treatment have been discussed with the patient and family. After consideration of risks, benefits and other options for treatment, the patient has consented to  Procedure(s) with comments: TOTAL KNEE ARTHROPLASTY (Right) - 44min as a surgical intervention.  The patient's history has been reviewed, patient examined, no change in status, stable for surgery.  I have reviewed the patient's chart and labs.  Questions were answered to the patient's satisfaction.     Pilar Plate Zuriah Bordas

## 2019-03-09 NOTE — Care Plan (Signed)
Ortho Bundle Case Management Note  Patient Details  Name: Ashley Mercer MRN: [id number] Date of Birth: 1940/10/14  R TKA on 03-09-19 DCP:  Home with spouse and children.  2 story home with 2 ste. DME:  No needs.  Has a RW and 3-in-1. PT:  [facility].  PT eval scheduled on 03-12-19.                   DME Arranged:  N/A DME Agency:  NA  HH Arranged:  NA HH Agency:  NA  Additional Comments: Please contact me with any questions of if this plan should need to change.  Marianne Sofia, RN,CCM [facility]  [phone] 03/09/2019, 4:13 PM

## 2019-03-09 NOTE — Anesthesia Procedure Notes (Signed)
Anesthesia Procedure Note     

## 2019-03-09 NOTE — Transfer of Care (Signed)
Immediate Anesthesia Transfer of Care Note  Patient: ALYNN HIPKE  Procedure(s) Performed: TOTAL KNEE ARTHROPLASTY (Right Knee)  Patient Location: PACU  Anesthesia Type:Spinal  Level of Consciousness: awake, alert  and oriented  Airway & Oxygen Therapy: Patient Spontanous Breathing and Patient connected to face mask oxygen  Post-op Assessment: Report given to RN and Post -op Vital signs reviewed and stable  Post vital signs: Reviewed and stable  Last Vitals:  Vitals Value Taken Time  BP 101/67 03/09/19 1416  Temp    Pulse 55 03/09/19 1420  Resp 14 03/09/19 1420  SpO2 97 % 03/09/19 1420  Vitals shown include unvalidated device data.  Last Pain:  Vitals:   03/09/19 1215  TempSrc:   PainSc: 0-No pain         Complications: No apparent anesthesia complications

## 2019-03-09 NOTE — Discharge Instructions (Addendum)
Ashley Arabian, MD Total Joint Specialist [facility] [place] [address] [place], [place] [address] [phone]  TOTAL KNEE REPLACEMENT POSTOPERATIVE DIRECTIONS    Knee Rehabilitation, Guidelines Following Surgery  Results after knee surgery are often greatly improved when you follow the exercise, range of motion and muscle strengthening exercises prescribed by your doctor. Safety measures are also important to protect the knee from further injury. If any of these exercises cause you to have increased pain or swelling in your knee joint, decrease the amount until you are comfortable again and slowly increase them. If you have problems or questions, call your caregiver or physical therapist for advice.   BLOOD CLOT PREVENTION . Take a 325 mg Aspirin two times a day for three weeks following surgery. Then take an 81 mg Aspirin once a day for three weeks. Then discontinue Aspirin. Dennis Bast may resume your vitamins/supplements upon discharge from the hospital. . Do not take any NSAIDs (Advil, Aleve, Ibuprofen, Meloxicam, etc.) until you have discontinued the 325 mg Aspirin.  HOME CARE INSTRUCTIONS  . Remove items at home which could result in a fall. This includes throw rugs or furniture in walking pathways.  . ICE to the affected knee as much as tolerated. Icing helps control swelling. If the swelling is well controlled you will be more comfortable and rehab easier. Continue to use ice on the knee for pain and swelling from surgery. You may notice swelling that will progress down to the foot and ankle. This is normal after surgery. Elevate the leg when you are not up walking on it.    . Continue to use the breathing machine which will help keep your temperature down. It is common for your temperature to cycle up and down following surgery, especially at night when you are not up moving around and exerting yourself. The breathing machine keeps your lungs expanded and your  temperature down. . Do not place pillow under the operative knee, focus on keeping the knee straight while resting  DIET You may resume your previous home diet once you are discharged from the hospital.  DRESSING / [place] / SHOWERING . Remove the bulky dressing 2 days following surgery, this will include an ACE wrap and rolled gauze. Leave the tape strips across the incision in place until your first follow-up appointment. Cover the incision with 4x4 gauze and paper tape. Change the dressing daily with new gauze and paper tape for 7-10 days following surgery. . You may begin showering 3 days following surgery, but do not submerge the incision under water. . Allow water and soap to run over the incision, pat dry, and apply a new gauze dressing  ACTIVITY For the first 5 days, the key is rest and control of pain and swelling . Do your home exercises twice a day starting on post-operative day 3. On the days you go to physical therapy, just do the home exercises once that day. . You should rest, ice and elevate the leg for 50 minutes out of every hour. Get up and walk/stretch for 10 minutes per hour. After 5 days you can increase your activity slowly as tolerated. . Walk with your walker as instructed. Use the walker until you are comfortable transitioning to a cane. Walk with the cane in the opposite hand of the operative leg. You may discontinue the cane once you are comfortable and walking steadily. . Avoid periods of inactivity such as sitting longer than an hour when not asleep.  This helps prevent blood clots.  . You may discontinue the knee immobilizer once you are able to perform a straight leg raise while lying down. . You may resume a sexual relationship in one month or when given the OK by your doctor.  . You may return to work once you are cleared by your doctor.  . Do not drive a car for 6 weeks or until released by you surgeon.  . Do not drive while taking narcotics.  TED HOSE  STOCKINGS Wear the elastic stockings on both legs for three weeks following surgery during the day. You may remove them at night for sleeping.  WEIGHT BEARING Weight bearing as tolerated with assist device (walker, cane, etc) as directed, use it as long as suggested by your surgeon or therapist, typically at least 4-6 weeks.  POSTOPERATIVE CONSTIPATION PROTOCOL Constipation - defined medically as fewer than three stools per week and severe constipation as less than one stool per week.  One of the most common issues patients have following surgery is constipation.  Even if you have a regular bowel pattern at home, your normal regimen is likely to be disrupted due to multiple reasons following surgery.  Combination of anesthesia, postoperative narcotics, change in appetite and fluid intake all can affect your bowels.  In order to avoid complications following surgery, here are some recommendations in order to help you during your recovery period.  . Colace (docusate) - Pick up an over-the-counter form of Colace or another stool softener and take twice a day as long as you are requiring postoperative pain medications.  Take with a full glass of water daily.  If you experience loose stools or diarrhea, hold the colace until you stool forms back up. If your symptoms do not get better within 1 week or if they get worse, check with your doctor. . Dulcolax (bisacodyl) - Pick up over-the-counter and take as directed by the product packaging as needed to assist with the movement of your bowels.  Take with a full glass of water.  Use this product as needed if not relieved by Colace only.  . MiraLax (polyethylene glycol) - Pick up over-the-counter to have on hand. MiraLax is a solution that will increase the amount of water in your bowels to assist with bowel movements.  Take as directed and can mix with a glass of water, juice, soda, coffee, or tea. Take if you go more than two days without a movement. Do not use  MiraLax more than once per day. Call your doctor if you are still constipated or irregular after using this medication for 7 days in a row.  If you continue to have problems with postoperative constipation, please contact the office for further assistance and recommendations.  If you experience "the worst abdominal pain ever" or develop nausea or vomiting, please contact the office immediatly for further recommendations for treatment.  ITCHING If you experience itching with your medications, try taking only a single pain pill, or even half a pain pill at a time.  You can also use Benadryl over the counter for itching or also to help with sleep.   MEDICATIONS See your medication summary on the "After Visit Summary" that the nursing staff will review with you prior to discharge.  You may have some home medications which will be placed on hold until you complete the course of blood thinner medication.  It is important for you to complete the blood thinner medication as prescribed by your surgeon.  Continue your approved medications as instructed at time of discharge.  PRECAUTIONS . If you experience chest pain or shortness of breath - call 911 immediately for transfer to the hospital emergency department.  . If you develop a fever greater that 101 F, purulent drainage from wound, increased redness or drainage from wound, foul odor from the wound/dressing, or calf pain - CONTACT YOUR SURGEON.                                                   FOLLOW-UP APPOINTMENTS Make sure you keep all of your appointments after your operation with your surgeon and caregivers. You should call the office at the above phone number and make an appointment for approximately two weeks after the date of your surgery or on the date instructed by your surgeon outlined in the "After Visit Summary".  RANGE OF MOTION AND STRENGTHENING EXERCISES  Rehabilitation of the knee is important following a knee injury or an operation.  After just a few days of immobilization, the muscles of the thigh which control the knee become weakened and shrink (atrophy). Knee exercises are designed to build up the tone and strength of the thigh muscles and to improve knee motion. Often times heat used for twenty to thirty minutes before working out will loosen up your tissues and help with improving the range of motion but do not use heat for the first two weeks following surgery. These exercises can be done on a training (exercise) mat, on the floor, on a table or on a bed. Use what ever works the best and is most comfortable for you Knee exercises include:  . Leg Lifts - While your knee is still immobilized in a splint or cast, you can do straight leg raises. Lift the leg to 60 degrees, hold for 3 sec, and slowly lower the leg. Repeat 10-20 times 2-3 times daily. Perform this exercise against resistance later as your knee gets better.  Javier Docker and Hamstring Sets - Tighten up the muscle on the front of the thigh (Quad) and hold for 5-10 sec. Repeat this 10-20 times hourly. Hamstring sets are done by pushing the foot backward against an object and holding for 5-10 sec. Repeat as with quad sets.   Leg Slides: Lying on your back, slowly slide your foot toward your buttocks, bending your knee up off the floor (only go as far as is comfortable). Then slowly slide your foot back down until your leg is flat on the floor again.  Angel Wings: Lying on your back spread your legs to the side as far apart as you can without causing discomfort.  A rehabilitation program following serious knee injuries can speed recovery and prevent re-injury in the future due to weakened muscles. Contact your doctor or a physical therapist for more information on knee rehabilitation.   IF YOU ARE TRANSFERRED TO A SKILLED REHAB FACILITY If the patient is transferred to a skilled rehab facility following release from the hospital, a list of the current medications will be sent to  the facility for the patient to continue.  When discharged from the skilled rehab facility, please have the facility set up the patient's [place] prior to being released. Also, the skilled facility will be responsible for providing the patient with their medications at time of release from the  facility to include their pain medication, the muscle relaxants, and their blood thinner medication. If the patient is still at the rehab facility at time of the two week follow up appointment, the skilled rehab facility will also need to assist the patient in arranging follow up appointment in our office and any transportation needs.  MAKE SURE YOU:  . Understand these instructions.  . Get help right away if you are not doing well or get worse.    Pick up stool softner and laxative for home use following surgery while on pain medications. Do not submerge incision under water. Please use good hand washing techniques while changing dressing each day. May shower starting three days after surgery. Please use a clean towel to pat the incision dry following showers. Continue to use ice for pain and swelling after surgery. Do not use any lotions or creams on the incision until instructed by your surgeon.

## 2019-03-09 NOTE — Anesthesia Procedure Notes (Signed)
Procedure Name: MAC Date/Time: 03/09/2019 12:35 PM Performed by: Eben Burow, CRNA Pre-anesthesia Checklist: Patient identified, Emergency Drugs available, Suction available, Patient being monitored and Timeout performed Oxygen Delivery Method: Simple face mask Dental Injury: Teeth and Oropharynx as per pre-operative assessment

## 2019-03-09 NOTE — Progress Notes (Signed)
PT Cancellation Note  Patient Details Name: Ashley Mercer MRN: [id number] DOB: July 08, 1940   Cancelled Treatment:    Reason Eval/Treat Not Completed: Other (comment)(Patient not "thawed out". Patient unable to raise non-surgical LE or surgical and unable to perform strong ankle plantar or dorsi flexion for bil LE. Will follow up at later date/time when pt is medically ready and as schedule allows.)   Verner Mould, DPT Physical Therapist with [facility] [phone]  03/09/2019 6:33 PM

## 2019-03-09 NOTE — Progress Notes (Signed)
AssistedDr. Moser with right, ultrasound guided, adductor canal block. Side rails up, monitors on throughout procedure. See vital signs in flow sheet. Tolerated Procedure well.  

## 2019-03-09 NOTE — Anesthesia Procedure Notes (Signed)
Anesthesia Regional Block: Adductor canal block   Pre-Anesthetic Checklist: ,, timeout performed, Correct Patient, Correct Site, Correct Laterality, Correct Procedure, Correct Position, site marked, Risks and benefits discussed,  Surgical consent,  Pre-op evaluation,  At surgeon's request and post-op pain management  Laterality: Right and Lower  Prep: chloraprep       Needles:  Injection technique: Single-shot     Needle Length: 9cm  Needle Gauge: 22     Additional Needles: Arrow StimuQuik ECHO Echogenic Stimulating PNB Needle  Procedures:,,,, ultrasound used (permanent image in chart),,,,  Narrative:  Start time: 03/09/2019 12:09 PM End time: 03/09/2019 12:14 PM Injection made incrementally with aspirations every 5 mL.  Performed by: Personally  Anesthesiologist: Oleta Mouse, MD

## 2019-03-10 ENCOUNTER — Other Ambulatory Visit: Payer: Self-pay

## 2019-03-10 DIAGNOSIS — Z79890 Hormone replacement therapy (postmenopausal): Secondary | ICD-10-CM | POA: Diagnosis not present

## 2019-03-10 DIAGNOSIS — Z7982 Long term (current) use of aspirin: Secondary | ICD-10-CM | POA: Diagnosis not present

## 2019-03-10 DIAGNOSIS — M1711 Unilateral primary osteoarthritis, right knee: Secondary | ICD-10-CM | POA: Diagnosis present

## 2019-03-10 DIAGNOSIS — Z8249 Family history of ischemic heart disease and other diseases of the circulatory system: Secondary | ICD-10-CM | POA: Diagnosis not present

## 2019-03-10 DIAGNOSIS — Z96651 Presence of right artificial knee joint: Secondary | ICD-10-CM

## 2019-03-10 DIAGNOSIS — M48062 Spinal stenosis, lumbar region with neurogenic claudication: Secondary | ICD-10-CM | POA: Diagnosis present

## 2019-03-10 DIAGNOSIS — E890 Postprocedural hypothyroidism: Secondary | ICD-10-CM | POA: Diagnosis present

## 2019-03-10 DIAGNOSIS — Z833 Family history of diabetes mellitus: Secondary | ICD-10-CM | POA: Diagnosis not present

## 2019-03-10 DIAGNOSIS — Z87891 Personal history of nicotine dependence: Secondary | ICD-10-CM | POA: Diagnosis not present

## 2019-03-10 DIAGNOSIS — K219 Gastro-esophageal reflux disease without esophagitis: Secondary | ICD-10-CM | POA: Diagnosis present

## 2019-03-10 DIAGNOSIS — M25761 Osteophyte, right knee: Secondary | ICD-10-CM | POA: Diagnosis present

## 2019-03-10 DIAGNOSIS — Z90710 Acquired absence of both cervix and uterus: Secondary | ICD-10-CM | POA: Diagnosis not present

## 2019-03-10 DIAGNOSIS — E669 Obesity, unspecified: Secondary | ICD-10-CM | POA: Diagnosis present

## 2019-03-10 DIAGNOSIS — M62838 Other muscle spasm: Secondary | ICD-10-CM | POA: Diagnosis not present

## 2019-03-10 DIAGNOSIS — Z6839 Body mass index (BMI) 39.0-39.9, adult: Secondary | ICD-10-CM | POA: Diagnosis not present

## 2019-03-10 DIAGNOSIS — I10 Essential (primary) hypertension: Secondary | ICD-10-CM | POA: Diagnosis present

## 2019-03-10 DIAGNOSIS — Z20822 Contact with and (suspected) exposure to covid-19: Secondary | ICD-10-CM | POA: Diagnosis present

## 2019-03-10 DIAGNOSIS — Z87440 Personal history of urinary (tract) infections: Secondary | ICD-10-CM | POA: Diagnosis not present

## 2019-03-10 DIAGNOSIS — L309 Dermatitis, unspecified: Secondary | ICD-10-CM | POA: Diagnosis present

## 2019-03-10 DIAGNOSIS — Z79899 Other long term (current) drug therapy: Secondary | ICD-10-CM | POA: Diagnosis not present

## 2019-03-10 DIAGNOSIS — F419 Anxiety disorder, unspecified: Secondary | ICD-10-CM | POA: Diagnosis present

## 2019-03-10 LAB — BASIC METABOLIC PANEL
Anion gap: 8 (ref 5–15)
BUN: 12 mg/dL (ref 8–23)
CO2: 26 mmol/L (ref 22–32)
Calcium: 9 mg/dL (ref 8.9–10.3)
Chloride: 107 mmol/L (ref 98–111)
Creatinine, Ser: 0.75 mg/dL (ref 0.44–1.00)
GFR calc Af Amer: >60 mL/min (ref >60)
GFR calc non Af Amer: >60 mL/min (ref >60)
Glucose, Bld: 137 mg/dL — ABNORMAL HIGH (ref 70–99)
Potassium: 3.9 mmol/L (ref 3.5–5.1)
Sodium: 141 mmol/L (ref 135–145)

## 2019-03-10 LAB — CBC
HCT: 36.4 % (ref 36.0–46.0)
Hemoglobin: 11.1 g/dL — ABNORMAL LOW (ref 12.0–15.0)
MCH: 26.7 pg (ref 26.0–34.0)
MCHC: 30.5 g/dL (ref 30.0–36.0)
MCV: 87.5 fL (ref 80.0–100.0)
Platelets: 329 10*3/uL (ref 150–400)
RBC: 4.16 MIL/uL (ref 3.87–5.11)
RDW: 15 % (ref 11.5–15.5)
WBC: 16.1 10*3/uL — ABNORMAL HIGH (ref 4.0–10.5)
nRBC: 0 % (ref 0.0–0.2)

## 2019-03-10 MED ORDER — CYCLOBENZAPRINE HCL 10 MG PO TABS: 10.0000 mg | ORAL_TABLET | Freq: Three times a day (TID) | ORAL | 0 refills | Status: AC | PRN

## 2019-03-10 MED ORDER — SODIUM CHLORIDE 0.9 % IV BOLUS
250.0000 mL | Freq: Once | INTRAVENOUS | Status: AC
  Administered 2019-03-10: 250 mL via INTRAVENOUS

## 2019-03-10 MED ORDER — ASPIRIN 325 MG PO TBEC: 325.0000 mg | DELAYED_RELEASE_TABLET | Freq: Two times a day (BID) | ORAL | 0 refills | Status: AC

## 2019-03-10 MED ORDER — CYCLOBENZAPRINE HCL 10 MG PO TABS
10.0000 mg | ORAL_TABLET | Freq: Three times a day (TID) | ORAL | Status: DC | PRN
  Administered 2019-03-10 – 2019-03-11 (×2): 10 mg via ORAL
  Filled 2019-03-10 (×2): qty 1

## 2019-03-10 MED ORDER — OXYCODONE HCL 5 MG PO TABS: 5.0000 mg | ORAL_TABLET | Freq: Four times a day (QID) | ORAL | 0 refills | Status: DC | PRN

## 2019-03-10 NOTE — Progress Notes (Signed)
Iv fluids decreased to 20 ml/hr kvo. Patient is having significantly improved urine output and po liquids intake. Will continue to monitor, call bell within reach.

## 2019-03-10 NOTE — Plan of Care (Signed)
  Problem: Activity: Goal: Ability to avoid complications of mobility impairment will improve Outcome: Progressing Goal: Ability to tolerate increased activity will improve Outcome: Progressing   Problem: Education: Goal: Verbalization of understanding the information provided will improve Outcome: Progressing   Problem: Education: Goal: Verbalization of understanding the information provided will improve Outcome: Progressing   Problem: Coping: Goal: Level of anxiety will decrease Outcome: Progressing   Problem: Physical Regulation: Goal: Postoperative complications will be avoided or minimized Outcome: Progressing   Problem: Respiratory: Goal: Ability to maintain a clear airway will improve Outcome: Progressing   Problem: Pain Management: Goal: Pain level will decrease Outcome: Progressing   Problem: Skin Integrity: Goal: Signs of wound healing will improve Outcome: Progressing   Problem: Tissue Perfusion: Goal: Ability to maintain adequate tissue perfusion will improve Outcome: Progressing   Problem: Education: Goal: Knowledge of General Education information will improve Description: Including pain rating scale, medication(s)/side effects and non-pharmacologic comfort measures Outcome: Progressing   Problem: Health Behavior/Discharge Planning: Goal: Ability to manage health-related needs will improve Outcome: Progressing   Problem: Clinical Measurements: Goal: Ability to maintain clinical measurements within normal limits will improve Outcome: Progressing Goal: Will remain free from infection Outcome: Progressing Goal: Diagnostic test results will improve Outcome: Progressing Goal: Respiratory complications will improve Outcome: Progressing Goal: Cardiovascular complication will be avoided Outcome: Progressing   Problem: Activity: Goal: Risk for activity intolerance will decrease Outcome: Progressing   Problem: Nutrition: Goal: Adequate nutrition  will be maintained Outcome: Progressing   Problem: Coping: Goal: Level of anxiety will decrease Outcome: Progressing   Problem: Elimination: Goal: Will not experience complications related to bowel motility Outcome: Progressing Goal: Will not experience complications related to urinary retention Outcome: Progressing   Problem: Pain Managment: Goal: General experience of comfort will improve Outcome: Progressing   Problem: Safety: Goal: Ability to remain free from injury will improve Outcome: Progressing   Problem: Skin Integrity: Goal: Risk for impaired skin integrity will decrease Outcome: Progressing

## 2019-03-10 NOTE — Progress Notes (Signed)
Physical Therapy Treatment Patient Details Name: Ashley Mercer MRN: [id number] DOB: February 25, 1940 Today's Date: 03/10/2019    History of Present Illness s/p R TKA. PMH: carpal tunnel,Lumbar stenosis with neurogenic claudication, Spondylolisthesis of lumbar region, HTN    PT Comments    Pt very pleasant and motivated, progressing slowly d/t global weakness/deconditioning as well as acute R knee pain d/t TKA.  Continue PT POC  Follow Up Recommendations  Follow surgeon's recommendation for DC plan and follow-up therapies;SNF(would benefit from SNF)     Equipment Recommendations  None recommended by PT    Recommendations for Other Services       Precautions / Restrictions Precautions Precautions: Fall;Knee Restrictions Weight Bearing Restrictions: No    Mobility  Bed Mobility Overal bed mobility: Needs Assistance Bed Mobility: Sit to Supine       Sit to supine: Mod assist;+2 for safety/equipment   General bed mobility comments: incr time, assist with LEs  Transfers Overall transfer level: Needs assistance Equipment used: Rolling walker (2 wheeled) Transfers: Sit to/from [facility] Sit to Stand: Mod assist;Max assist;+2 physical assistance;+2 safety/equipment Stand pivot transfers: Max assist;Mod assist;+2 physical assistance;+2 safety/equipment       General transfer comment: pt requiring assist to rise and transition to RW, significant trunk/hip/knee flexion. +2 and multi-modal cues for trunk, hip, knee extension as well as use of UEs   Ambulation/Gait             General Gait Details: pivotal steps only d/t bil knee buckling   Stairs             Wheelchair Mobility    Modified Rankin (Stroke Patients Only)       Balance                                            Cognition Arousal/Alertness: Awake/alert Behavior During Therapy: WFL for tasks assessed/performed Overall Cognitive Status: Within Functional  Limits for tasks assessed                                        Exercises Total Joint Exercises Ankle Circles/Pumps: AROM;10 reps;Both Quad Sets: AROM;10 reps;Both Heel Slides: AROM;AAROM;Right;10 reps Hip ABduction/ADduction: AROM;AAROM;Right;10 reps Straight Leg Raises: AROM;AAROM;Right;10 reps    General Comments        Pertinent Vitals/Pain Pain Assessment: 0-10 Pain Score: 4  Pain Location: right knee Pain Descriptors / Indicators: Grimacing;Sore Pain Intervention(s): Limited activity within patient's tolerance;Monitored during session;Premedicated before session;Repositioned;Ice applied    Home Living                      Prior Function            PT Goals (current goals can now be found in the care plan section) Acute Rehab PT Goals PT Goal Formulation: With patient Time For Goal Achievement: 03/23/19 Potential to Achieve Goals: Fair Progress towards PT goals: Progressing toward goals(slowly)    Frequency    7X/week      PT Plan Current plan remains appropriate    Co-evaluation              AM-PAC PT "6 Clicks" Mobility   Outcome Measure  Help needed turning from your back to your side while in a flat bed without  using bedrails?: A Lot Help needed moving from lying on your back to sitting on the side of a flat bed without using bedrails?: A Lot Help needed moving to and from a bed to a chair (including a wheelchair)?: A Lot Help needed standing up from a chair using your arms (e.g., wheelchair or bedside chair)?: A Lot Help needed to walk in hospital room?: Total Help needed climbing 3-5 steps with a railing? : Total 6 Click Score: 10    End of Session Equipment Utilized During Treatment: Gait belt;Right knee immobilizer Activity Tolerance: Patient tolerated treatment well;Patient limited by fatigue Patient left: with call bell/phone within reach;in bed;with bed alarm set   PT Visit Diagnosis: Difficulty in  walking, not elsewhere classified (R26.2)     Time: [id number] PT Time Calculation (min) (ACUTE ONLY): 27 min  Charges:  $Therapeutic Activity: 23-37 mins                     Baxter Flattery, PT   Acute Rehab Dept [facility]): [id number]   03/10/2019    [facility] 03/10/2019, 4:34 PM

## 2019-03-10 NOTE — Progress Notes (Signed)
Patient unable to void after 2 attempts and having the purewick placed. Bladder scanned patient and residual obtained 163 cc. IV fluids restarted r/t dehydration. Patient is taking good po intake. Notified Markus Daft, [place]. NS 250 cc bolus ordered and given. Patient denies dizziness or pain at this time. Call bell within reach.

## 2019-03-10 NOTE — Progress Notes (Signed)
Patient stood for 3 minutes w/ one assist and walker. Tolerated okay.

## 2019-03-10 NOTE — Progress Notes (Signed)
Subjective: 1 Day Post-Op Procedure(s) (LRB): TOTAL KNEE ARTHROPLASTY (Right) Patient reports pain as moderate.   Patient seen in rounds by Dr. Wynelle Link. Patient is well, and has had no acute complaints or problems other than pain in the right knee. Was having issues with muscle spasms despite use of Robaxin. Muscle relaxer switched to Flexeril 10 mg TID PRN. Denies chest pain, SOB, or calf pain. Foley catheter to be removed this AM. We will begin therapy today.   Objective: Vital signs in last 24 hours: Temp:  [97.3 F (36.3 C)-97.9 F (36.6 C)] 97.7 F (36.5 C) (02/23 [id number]) Pulse Rate:  [51-71] 63 (02/23 0637) Resp:  [12-25] 16 (02/23 0637) BP: (101-169)/(59-92) 136/83 (02/23 0637) SpO2:  [90 %-99 %] 96 % (02/23 0637) Weight:  [97.5 kg] 97.5 kg (02/22 1242)  Intake/Output from previous day:  Intake/Output Summary (Last 24 hours) at 03/10/2019 0723 Last data filed at 03/10/2019 [id number] Gross per 24 hour  Intake 3020.28 ml  Output 2600 ml  Net 420.28 ml    Labs: Recent Labs    03/09/19 1428 03/10/19 0417  HGB 12.0 11.1*   Recent Labs    03/09/19 1428 03/10/19 0417  WBC 10.0 16.1*  RBC 4.52 4.16  HCT 39.5 36.4  PLT 334 329   Recent Labs    03/09/19 1428 03/10/19 0417  NA 140 141  K 3.3* 3.9  CL 105 107  CO2 27 26  BUN 12 12  CREATININE 0.74 0.75  GLUCOSE 82 137*  CALCIUM 8.5* 9.0   Recent Labs    03/09/19 1428  INR 1.0   Exam: General - Patient is Alert and Oriented Extremity - Neurologically intact Neurovascular intact Sensation intact distally Dorsiflexion/Plantar flexion intact Dressing - dressing C/D/I Motor Function - intact, moving foot and toes well on exam.   Past Medical History:  Diagnosis Date  . Anemia    during pregnancy  . Anxiety   . Arthritis   . Cervical spinal stenosis   . Chronic back pain   . Dry skin   . Eczema   . Fatty tumor    Right wrist  . Frequent UTI   . GERD (gastroesophageal reflux disease)   . High  cholesterol   . History of blood in urine    Pt was seen by [facility]; scant amount; no current issues  . Hypertension   . Hypothyroidism   . Lumbar spinal stenosis   . Numbness and tingling in hands    Bilateral  . Pneumonia   . Proteinuria 2020   seeing a kidney specialist  . Thyroid condition     Assessment/Plan: 1 Day Post-Op Procedure(s) (LRB): TOTAL KNEE ARTHROPLASTY (Right) Principal Problem:   OA (osteoarthritis) of knee Active Problems:   Osteoarthritis of right knee  Estimated body mass index is 39.32 kg/m as calculated from the following:   Height as of 12/01/18: 5\' 2"  (1.575 m).   Weight as of this encounter: 97.5 kg. Advance diet Up with therapy D/C IV fluids  Anticipated LOS equal to or greater than 2 midnights due to - Age 1 and older with one or more of the following:  - Obesity  - Expected need for hospital services (PT, OT, Nursing) required for safe  discharge  - Anticipated need for postoperative skilled nursing care or inpatient rehab  - Active co-morbidities: None OR   - Unanticipated findings during/Post Surgery: None  - Patient is a high risk of re-admission due to: None  DVT Prophylaxis - Aspirin Weight bearing as tolerated. D/C O2 and pulse ox and try on room air. Hemovac pulled without difficulty, will continue therapy today.  Plan is to go Home after hospital stay. Possible discharge this afternoon if progresses with therapy and is meeting her goals. Scheduled for outpatient physical therapy at [facility]. Follow-up in the office in 2 weeks.   Theresa Duty, PA-C Orthopedic Surgery 03/10/2019, 7:23 AM

## 2019-03-10 NOTE — Progress Notes (Signed)
Notified Markus Daft PA that patient did not pass physical therapy requirements needed for safe discharge. Recomneds one more night- informed patient and patient daughter of update. Call bell within reach, bed alarm on, side rails up x3, wheels locked, bed in lowest position.

## 2019-03-10 NOTE — TOC Progression Note (Signed)
Transition of Care [facility]) - Progression Note    Patient Details  Name: Ashley Mercer MRN: [id number] Date of Birth: 07/27/1940  Transition of Care [facility]) CM/SW [place], [place] Phone Number: 03/10/2019, 9:36 AM  Clinical Narrative:    Patient reports she does not have a 3 in 1.  3 in 1 ordered and delivered to the patient room by [employer].      Barriers to Discharge: No Barriers Identified  Expected Discharge Plan and Services           Expected Discharge Date: 03/10/19               DME Arranged: 3-N-1 DME Agency: [employer] Date DME Agency Contacted: 03/10/19 Time DME Agency Contacted: [phone] Representative spoke with at DME Agency: Ovid Curd HH Arranged: NA [place] Agency: NA         Social Determinants of Health ([place]) Interventions    Readmission Risk Interventions No flowsheet data found.

## 2019-03-10 NOTE — Evaluation (Signed)
Physical Therapy Evaluation Patient Details Name: Ashley Mercer MRN: [id number] DOB: Feb 28, 1940 Today's Date: 03/10/2019   History of Present Illness  s/p R TKA. PMH: carpal tunnel,Lumbar stenosis with neurogenic claudication, Spondylolisthesis of lumbar region, HTN  Clinical Impression  Pt is s/p TKA resulting in the deficits listed below (see PT Problem List).  Pt presents  with global weakness/deconditioning, limited amb at home. Requiring +2 max/total assist for transfers today. Needs SNF post acute. If d/c's home may need w/c and non-emergency ambulance transport home, unless she makes significant improvement over the next few days  Pt will benefit from skilled PT to increase their independence and safety with mobility to allow discharge to the venue listed below. '    Follow Up Recommendations Follow surgeon's recommendation for DC plan and follow-up therapies;SNF(needs SNF)    Equipment Recommendations  None recommended by PT    Recommendations for Other Services       Precautions / Restrictions Precautions Precautions: Fall;Knee Restrictions Weight Bearing Restrictions: No      Mobility  Bed Mobility Overal bed mobility: Needs Assistance Bed Mobility: Supine to Sit     Supine to sit: Mod assist;+2 for safety/equipment;+2 for physical assistance     General bed mobility comments: incr time, assist with LEs, bed pad used to complete scooting to EOB  Transfers Overall transfer level: Needs assistance Equipment used: Rolling walker (2 wheeled) Transfers: Sit to/from [facility] Sit to Stand: Max assist;Total assist;+2 physical assistance;+2 safety/equipment Stand pivot transfers: Max assist;Total assist;+2 physical assistance;+2 safety/equipment       General transfer comment: pt requiring assist to rise and transition to RW; sit to stand repeated x2, significant trunk/hip/knee flexion and pt unable to maintain extension greater than 2 seconds;  max-total assist to pivot to chair, bil knees buckling  Ambulation/Gait             General Gait Details: attempted steps forward, 2 small steps with total assist.  Stairs            Wheelchair Mobility    Modified Rankin (Stroke Patients Only)       Balance                                             Pertinent Vitals/Pain Pain Assessment: 0-10 Pain Score: 5  Pain Location: right knee Pain Descriptors / Indicators: Grimacing;Sore Pain Intervention(s): Monitored during session;Limited activity within patient's tolerance;Premedicated before session;Repositioned    Home Living Family/patient expects to be discharged to:: Private residence Living Arrangements: Spouse/significant other   Type of Home: House Home Access: Stairs to enter Entrance Stairs-Rails: None Entrance Stairs-Number of Steps: 2 Home Layout: Two level;1/2 bath on main level Home Equipment: Walker - 2 wheels Additional Comments: plans to sleep in recliner downstairs, reports multiple falls at home. minimal ambulator at baseline    Prior Function Level of Independence: Independent               Hand Dominance        Extremity/Trunk Assessment   Upper Extremity Assessment Upper Extremity Assessment: Generalized weakness    Lower Extremity Assessment Lower Extremity Assessment: RLE deficits/detail;LLE deficits/detail RLE Deficits / Details: grossly 2/5 hip and knee, AAROM 15 to 45 degrees flexion LLE Deficits / Details: AROM grossly WFL, knee and hip 2+/5, ankle [facility]       Communication   Communication:  No difficulties  Cognition Arousal/Alertness: Awake/alert Behavior During Therapy: WFL for tasks assessed/performed Overall Cognitive Status: Within Functional Limits for tasks assessed                                        General Comments      Exercises Total Joint Exercises Ankle Circles/Pumps: AROM;10 reps;Both Quad Sets: AROM;10  reps;Both   Assessment/Plan    PT Assessment Patient needs continued PT services  PT Problem List Decreased strength;Decreased range of motion;Decreased activity tolerance;Decreased mobility;Pain;Decreased knowledge of use of DME       PT Treatment Interventions Therapeutic exercise;DME instruction;Gait training;Therapeutic activities;Patient/family education;Functional mobility training;Stair training    PT Goals (Current goals can be found in the Care Plan section)  Acute Rehab PT Goals PT Goal Formulation: With patient Time For Goal Achievement: 03/23/19 Potential to Achieve Goals: Fair    Frequency 7X/week   Barriers to discharge        Co-evaluation               AM-PAC PT "6 Clicks" Mobility  Outcome Measure Help needed turning from your back to your side while in a flat bed without using bedrails?: A Lot Help needed moving from lying on your back to sitting on the side of a flat bed without using bedrails?: A Lot Help needed moving to and from a bed to a chair (including a wheelchair)?: Total Help needed standing up from a chair using your arms (e.g., wheelchair or bedside chair)?: Total Help needed to walk in hospital room?: Total Help needed climbing 3-5 steps with a railing? : Total 6 Click Score: 8    End of Session Equipment Utilized During Treatment: Gait belt;Right knee immobilizer Activity Tolerance: Patient limited by fatigue;Patient limited by pain Patient left: in chair;with call bell/phone within reach;with chair alarm set;with family/visitor present   PT Visit Diagnosis: Difficulty in walking, not elsewhere classified (R26.2)    Time: [id number] PT Time Calculation (min) (ACUTE ONLY): 26 min   Charges:   PT Evaluation $PT Eval Low Complexity: 1 Low PT Treatments $Therapeutic Activity: 8-22 mins        Baxter Flattery, PT   Acute Rehab Dept [facility]): [id number]   03/10/2019   [facility] 03/10/2019, 10:53 AM

## 2019-03-10 NOTE — Progress Notes (Signed)
Pt has had slower progression with physical therapy, is not yet meeting goals to be cleared for discharge. Will stay overnight and continue working with therapy tomorrow.   Theresa Duty, PA-C Orthopedic Surgery [facility]

## 2019-03-11 ENCOUNTER — Encounter: Payer: Self-pay | Admitting: *Deleted

## 2019-03-11 LAB — CBC
HCT: 33.4 % — ABNORMAL LOW (ref 36.0–46.0)
Hemoglobin: 10.1 g/dL — ABNORMAL LOW (ref 12.0–15.0)
MCH: 26.8 pg (ref 26.0–34.0)
MCHC: 30.2 g/dL (ref 30.0–36.0)
MCV: 88.6 fL (ref 80.0–100.0)
Platelets: 336 10*3/uL (ref 150–400)
RBC: 3.77 MIL/uL — ABNORMAL LOW (ref 3.87–5.11)
RDW: 15.4 % (ref 11.5–15.5)
WBC: 15.1 10*3/uL — ABNORMAL HIGH (ref 4.0–10.5)
nRBC: 0 % (ref 0.0–0.2)

## 2019-03-11 LAB — BASIC METABOLIC PANEL
Anion gap: 8 (ref 5–15)
BUN: 21 mg/dL (ref 8–23)
CO2: 26 mmol/L (ref 22–32)
Calcium: 9.1 mg/dL (ref 8.9–10.3)
Chloride: 105 mmol/L (ref 98–111)
Creatinine, Ser: 0.88 mg/dL (ref 0.44–1.00)
GFR calc Af Amer: >60 mL/min (ref >60)
GFR calc non Af Amer: >60 mL/min (ref >60)
Glucose, Bld: 118 mg/dL — ABNORMAL HIGH (ref 70–99)
Potassium: 3.6 mmol/L (ref 3.5–5.1)
Sodium: 139 mmol/L (ref 135–145)

## 2019-03-11 NOTE — Anesthesia Postprocedure Evaluation (Signed)
Anesthesia Post Note  Patient: Ashley Mercer  Procedure(s) Performed: TOTAL KNEE ARTHROPLASTY (Right Knee)     Patient location during evaluation: PACU Anesthesia Type: Regional, MAC and Spinal Level of consciousness: awake and alert Pain management: pain level controlled Vital Signs Assessment: post-procedure vital signs reviewed and stable Respiratory status: spontaneous breathing, nonlabored ventilation, respiratory function stable and patient connected to nasal cannula oxygen Cardiovascular status: stable and blood pressure returned to baseline Postop Assessment: no apparent nausea or vomiting and spinal receding Anesthetic complications: no    Last Vitals:  Vitals:   03/11/19 0732 03/11/19 0909  BP: (!) 104/59 (!) 103/55  Pulse: 61 60  Resp: 16 16  Temp: 36.7 C 36.5 C  SpO2: (!) 87% 94%    Last Pain:  Vitals:   03/11/19 0909  TempSrc: Oral  PainSc: Asleep                 Maisie Hauser

## 2019-03-11 NOTE — Progress Notes (Signed)
HHPT arranged by Orthopedic office. [facility] aware and will follow the patient.

## 2019-03-11 NOTE — Plan of Care (Signed)
Patient was given discharge instructions, all questions were answered, and patient was taken to main entrance via wheelchair.

## 2019-03-11 NOTE — Progress Notes (Signed)
   Subjective: 2 Days Post-Op Procedure(s) (LRB): TOTAL KNEE ARTHROPLASTY (Right) Patient reports pain as mild.   Patient seen in rounds with Dr. Wynelle Link. Patient is well, and has had no acute complaints or problems other than soreness in the right knee. She denies SOB and chest pain. No acute events overnight. Voiding well today. Reports better urine output. No BM but positive flatus. Reports need to HHPT as opposed to outpatient PT due to husband's immunocompromised state.    Objective: Vital signs in last 24 hours: Temp:  [97.6 F (36.4 C)-97.8 F (36.6 C)] 97.7 F (36.5 C) (02/24 0640) Pulse Rate:  [65-72] 65 (02/24 0640) Resp:  [16-18] 18 (02/24 0640) BP: (109-145)/(48-85) 109/48 (02/24 0640) SpO2:  [84 %-94 %] 93 % (02/24 0640) Weight:  [97.5 kg] 97.5 kg (02/23 1700)  Intake/Output from previous day:  Intake/Output Summary (Last 24 hours) at 03/11/2019 0732 Last data filed at 03/11/2019 0645 Gross per 24 hour  Intake 1314 ml  Output 1809 ml  Net -495 ml     Labs: Recent Labs    03/09/19 1428 03/10/19 0417 03/11/19 0445  HGB 12.0 11.1* 10.1*   Recent Labs    03/10/19 0417 03/11/19 0445  WBC 16.1* 15.1*  RBC 4.16 3.77*  HCT 36.4 33.4*  PLT 329 336   Recent Labs    03/10/19 0417 03/11/19 0445  NA 141 139  K 3.9 3.6  CL 107 105  CO2 26 26  BUN 12 21  CREATININE 0.75 0.88  GLUCOSE 137* 118*  CALCIUM 9.0 9.1   Recent Labs    03/09/19 1428  INR 1.0    EXAM General - Patient is Alert and Oriented Extremity - Neurologically intact Sensation intact distally Intact pulses distally Dorsiflexion/Plantar flexion intact No cellulitis present Compartment soft Dressing/Incision - clean, dry, no drainage Motor Function - intact, moving foot and toes well on exam.   Past Medical History:  Diagnosis Date  . Anemia    during pregnancy  . Anxiety   . Arthritis   . Cervical spinal stenosis   . Chronic back pain   . Dry skin   . Eczema   . Fatty  tumor    Right wrist  . Frequent UTI   . GERD (gastroesophageal reflux disease)   . High cholesterol   . History of blood in urine    Pt was seen by [facility]; scant amount; no current issues  . Hypertension   . Hypothyroidism   . Lumbar spinal stenosis   . Numbness and tingling in hands    Bilateral  . Pneumonia   . Proteinuria 2020   seeing a kidney specialist  . Thyroid condition     Assessment/Plan: 2 Days Post-Op Procedure(s) (LRB): TOTAL KNEE ARTHROPLASTY (Right) Principal Problem:   OA (osteoarthritis) of knee Active Problems:   Osteoarthritis of right knee   S/P total knee arthroplasty, right  Estimated body mass index is 36.9 kg/m as calculated from the following:   Height as of this encounter: 5\' 4"  (1.626 m).   Weight as of this encounter: 97.5 kg. Advance diet Up with therapy Discharge home with home health  DVT Prophylaxis - Aspirin Weight-Bearing as tolerated   Continue therapy for a session or two today. Plan for DC home with HHPT with [facility] if progressing well with PT and meeting goals. Follow up in office in 2 weeks. Discharge instructions given.   Ardeen Jourdain, PA-C Orthopaedic Surgery 03/11/2019, 7:32 AM

## 2019-03-11 NOTE — Progress Notes (Signed)
Physical Therapy Treatment Patient Details Name: Ashley Mercer MRN: [id number] DOB: Jun 03, 1940 Today's Date: 03/11/2019    History of Present Illness s/p R TKA. PMH: carpal tunnel,Lumbar stenosis with neurogenic claudication, Spondylolisthesis of lumbar region, HTN    PT Comments    Pt progressing slowly, will likely need  One more day to achieve goals and be safe to d/c home with family assist.   Follow Up Recommendations  Follow surgeon's recommendation for DC plan and follow-up therapies;SNF     Equipment Recommendations  None recommended by PT    Recommendations for Other Services       Precautions / Restrictions Precautions Precautions: Fall;Knee Restrictions Weight Bearing Restrictions: No    Mobility  Bed Mobility Overal bed mobility: Needs Assistance Bed Mobility: Supine to Sit     Supine to sit: Min assist     General bed mobility comments: incr time, assist with LEs. bed p;ad used to assist scooting  Transfers Overall transfer level: Needs assistance Equipment used: Rolling walker (2 wheeled) Transfers: Sit to/from Stand Sit to Stand: Min assist;+2 physical assistance;+2 safety/equipment         General transfer comment: pt requiring assist to rise and transition to RW, significant trunk/hip/knee flexion. +2 and multi-modal cues for trunk, hip, knee extension as well as use of UEs   Ambulation/Gait Ambulation/Gait assistance: Min assist;Mod assist;+2 safety/equipment Gait Distance (Feet): 15 Feet Assistive device: Rolling walker (2 wheeled) Gait Pattern/deviations: Step-to pattern;Decreased stance time - left     General Gait Details: ccues for sequence, RW position, trunk/hip;/bil knee extension   Stairs             Wheelchair Mobility    Modified Rankin (Stroke Patients Only)       Balance                                            Cognition Arousal/Alertness: Awake/alert Behavior During Therapy: WFL for  tasks assessed/performed Overall Cognitive Status: Within Functional Limits for tasks assessed                                        Exercises Total Joint Exercises Ankle Circles/Pumps: AROM;10 reps;Both Quad Sets: AROM;10 reps;Both    General Comments        Pertinent Vitals/Pain Pain Assessment: 0-10 Pain Score: 4  Pain Location: right knee Pain Descriptors / Indicators: Grimacing;Sore Pain Intervention(s): Limited activity within patient's tolerance;Monitored during session;Premedicated before session;Repositioned;Ice applied    Home Living                      Prior Function            PT Goals (current goals can now be found in the care plan section) Acute Rehab PT Goals PT Goal Formulation: With patient Time For Goal Achievement: 03/23/19 Potential to Achieve Goals: Good Progress towards PT goals: Progressing toward goals    Frequency    7X/week      PT Plan Current plan remains appropriate    Co-evaluation              AM-PAC PT "6 Clicks" Mobility   Outcome Measure  Help needed turning from your back to your side while in a flat bed without using bedrails?: A Lot  Help needed moving from lying on your back to sitting on the side of a flat bed without using bedrails?: A Lot Help needed moving to and from a bed to a chair (including a wheelchair)?: A Lot Help needed standing up from a chair using your arms (e.g., wheelchair or bedside chair)?: A Lot Help needed to walk in hospital room?: A Lot Help needed climbing 3-5 steps with a railing? : Total 6 Click Score: 11    End of Session Equipment Utilized During Treatment: Gait belt;Right knee immobilizer Activity Tolerance: Patient tolerated treatment well Patient left: in chair;with call bell/phone within reach;with chair alarm set   PT Visit Diagnosis: Difficulty in walking, not elsewhere classified (R26.2)     Time: [id number] PT Time Calculation (min) (ACUTE  ONLY): 23 min  Charges:  $Gait Training: 23-37 mins                     Ethne Jeon, PT   Acute Rehab Dept [facility]): [id number]   03/11/2019    [facility] 03/11/2019, 10:58 AM

## 2019-03-12 DIAGNOSIS — Z9181 History of falling: Secondary | ICD-10-CM | POA: Diagnosis not present

## 2019-03-12 DIAGNOSIS — M48062 Spinal stenosis, lumbar region with neurogenic claudication: Secondary | ICD-10-CM | POA: Diagnosis not present

## 2019-03-12 DIAGNOSIS — G8929 Other chronic pain: Secondary | ICD-10-CM | POA: Diagnosis not present

## 2019-03-12 DIAGNOSIS — D649 Anemia, unspecified: Secondary | ICD-10-CM | POA: Diagnosis not present

## 2019-03-12 DIAGNOSIS — Z87440 Personal history of urinary (tract) infections: Secondary | ICD-10-CM | POA: Diagnosis not present

## 2019-03-12 DIAGNOSIS — Z7982 Long term (current) use of aspirin: Secondary | ICD-10-CM | POA: Diagnosis not present

## 2019-03-12 DIAGNOSIS — I10 Essential (primary) hypertension: Secondary | ICD-10-CM | POA: Diagnosis not present

## 2019-03-12 DIAGNOSIS — Z87891 Personal history of nicotine dependence: Secondary | ICD-10-CM | POA: Diagnosis not present

## 2019-03-12 DIAGNOSIS — Z96651 Presence of right artificial knee joint: Secondary | ICD-10-CM | POA: Diagnosis not present

## 2019-03-12 DIAGNOSIS — M4316 Spondylolisthesis, lumbar region: Secondary | ICD-10-CM | POA: Diagnosis not present

## 2019-03-12 DIAGNOSIS — K219 Gastro-esophageal reflux disease without esophagitis: Secondary | ICD-10-CM | POA: Diagnosis not present

## 2019-03-12 DIAGNOSIS — M4802 Spinal stenosis, cervical region: Secondary | ICD-10-CM | POA: Diagnosis not present

## 2019-03-12 DIAGNOSIS — Z6839 Body mass index (BMI) 39.0-39.9, adult: Secondary | ICD-10-CM | POA: Diagnosis not present

## 2019-03-12 DIAGNOSIS — Z8701 Personal history of pneumonia (recurrent): Secondary | ICD-10-CM | POA: Diagnosis not present

## 2019-03-12 DIAGNOSIS — F419 Anxiety disorder, unspecified: Secondary | ICD-10-CM | POA: Diagnosis not present

## 2019-03-12 DIAGNOSIS — Z471 Aftercare following joint replacement surgery: Secondary | ICD-10-CM | POA: Diagnosis not present

## 2019-03-12 DIAGNOSIS — M4712 Other spondylosis with myelopathy, cervical region: Secondary | ICD-10-CM | POA: Diagnosis not present

## 2019-03-12 DIAGNOSIS — E039 Hypothyroidism, unspecified: Secondary | ICD-10-CM | POA: Diagnosis not present

## 2019-03-12 NOTE — Discharge Summary (Signed)
Physician Discharge Summary   Patient ID: Ashley Mercer MRN: [id number] DOB/AGE: 1940-04-16 79 y.o.  Admit date: 03/09/2019 Discharge date: 03/11/2019  Primary Diagnosis: Primary osteoarthritis right knee  Admission Diagnoses:  Past Medical History:  Diagnosis Date  . Anemia    during pregnancy  . Anxiety   . Arthritis   . Cervical spinal stenosis   . Chronic back pain   . Dry skin   . Eczema   . Fatty tumor    Right wrist  . Frequent UTI   . GERD (gastroesophageal reflux disease)   . High cholesterol   . History of blood in urine    Pt was seen by [facility]; scant amount; no current issues  . Hypertension   . Hypothyroidism   . Lumbar spinal stenosis   . Numbness and tingling in hands    Bilateral  . Pneumonia   . Proteinuria 2020   seeing a kidney specialist  . Thyroid condition    Discharge Diagnoses:   Principal Problem:   OA (osteoarthritis) of knee Active Problems:   Osteoarthritis of right knee   S/P total knee arthroplasty, right  Estimated body mass index is 36.9 kg/m as calculated from the following:   Height as of this encounter: 5\' 4"  (1.626 m).   Weight as of this encounter: 97.5 kg.  Procedure:  Procedure(s) (LRB): TOTAL KNEE ARTHROPLASTY (Right)   Consults: None  HPI: Ashley Mercer, 79 y.o. female, has a history of pain and functional disability in the right knee due to arthritis and has failed non-surgical conservative treatments for greater than 12 weeks to includeNSAID's and/or analgesics, corticosteriod injections, flexibility and strengthening excercises, use of assistive devices and activity modification.  Onset of symptoms was gradual, starting 8 years ago with gradually worsening course since that time. The patient noted no past surgery on the right knee(s).  Patient currently rates pain in the right knee(s) at 7 out of 10 with activity. Patient has night pain, worsening of pain with activity and weight bearing, pain that interferes  with activities of daily living, pain with passive range of motion, crepitus and joint swelling.  Patient has evidence of periarticular osteophytes and joint space narrowing by imaging studies.  There is no active infection.  Laboratory Data: Admission on 03/09/2019, Discharged on 03/11/2019  Component Date Value Ref Range Status  . WBC 03/09/2019 10.0  4.0 - 10.5 K/uL Final  . RBC 03/09/2019 4.52  3.87 - 5.11 MIL/uL Final  . Hemoglobin 03/09/2019 12.0  12.0 - 15.0 g/dL Final  . HCT 03/09/2019 39.5  36.0 - 46.0 % Final  . MCV 03/09/2019 87.4  80.0 - 100.0 fL Final  . MCH 03/09/2019 26.5  26.0 - 34.0 pg Final  . MCHC 03/09/2019 30.4  30.0 - 36.0 g/dL Final  . RDW 03/09/2019 15.1  11.5 - 15.5 % Final  . Platelets 03/09/2019 334  150 - 400 K/uL Final  . nRBC 03/09/2019 0.0  0.0 - 0.2 % Final   Performed at [facility], [place] [address], [place], [place] [address]  . Sodium 03/09/2019 140  135 - 145 mmol/L Final  . Potassium 03/09/2019 3.3* 3.5 - 5.1 mmol/L Final  . Chloride 03/09/2019 105  98 - 111 mmol/L Final  . CO2 03/09/2019 27  22 - 32 mmol/L Final  . Glucose, Bld 03/09/2019 82  70 - 99 mg/dL Final  . BUN 03/09/2019 12  8 - 23 mg/dL Final  . Creatinine, Ser 03/09/2019  0.74  0.44 - 1.00 mg/dL Final  . Calcium 03/09/2019 8.5* 8.9 - 10.3 mg/dL Final  . Total Protein 03/09/2019 6.1* 6.5 - 8.1 g/dL Final  . Albumin 03/09/2019 3.1* 3.5 - 5.0 g/dL Final  . AST 03/09/2019 18  15 - 41 U/L Final  . ALT 03/09/2019 16  0 - 44 U/L Final  . Alkaline Phosphatase 03/09/2019 59  38 - 126 U/L Final  . Total Bilirubin 03/09/2019 0.4  0.3 - 1.2 mg/dL Final  . GFR calc non Af Amer 03/09/2019 >60  >60 mL/min Final  . GFR calc Af Amer 03/09/2019 >60  >60 mL/min Final  . Anion gap 03/09/2019 8  5 - 15 Final   Performed at [facility], [place] [address], [place], [place] [address]  . Prothrombin Time 03/09/2019 13.3  11.4 - 15.2 seconds Final  . INR 03/09/2019 1.0  0.8  - 1.2 Final   Comment: (NOTE) INR goal varies based on device and disease states. Performed at [facility], [place] [address], [place], [place] [address]   . aPTT 03/09/2019 32  24 - 36 seconds Final   Performed at [facility], [place] [address] [place], [place] [address]  . ABO/RH(D) 03/09/2019 A NEG   Final  . Antibody Screen 03/09/2019 NEG   Final  . Sample Expiration 03/09/2019    Final                   Value:03/12/2019,2359 Performed at [facility], [place] [address], [place], [place] [address]   . ABO/RH(D) 03/09/2019    Final                   Value:A NEG Performed at [facility], [place] [address], [place], [place] [address]   . WBC 03/10/2019 16.1* 4.0 - 10.5 K/uL Final  . RBC 03/10/2019 4.16  3.87 - 5.11 MIL/uL Final  . Hemoglobin 03/10/2019 11.1* 12.0 - 15.0 g/dL Final  . HCT 03/10/2019 36.4  36.0 - 46.0 % Final  . MCV 03/10/2019 87.5  80.0 - 100.0 fL Final  . MCH 03/10/2019 26.7  26.0 - 34.0 pg Final  . MCHC 03/10/2019 30.5  30.0 - 36.0 g/dL Final  . RDW 03/10/2019 15.0  11.5 - 15.5 % Final  . Platelets 03/10/2019 329  150 - 400 K/uL Final  . nRBC 03/10/2019 0.0  0.0 - 0.2 % Final   Performed at [facility], [place] [address] [place], [place] [address]  . Sodium 03/10/2019 141  135 - 145 mmol/L Final  . Potassium 03/10/2019 3.9  3.5 - 5.1 mmol/L Final  . Chloride 03/10/2019 107  98 - 111 mmol/L Final  . CO2 03/10/2019 26  22 - 32 mmol/L Final  . Glucose, Bld 03/10/2019 137* 70 - 99 mg/dL Final  . BUN 03/10/2019 12  8 - 23 mg/dL Final  . Creatinine, Ser 03/10/2019 0.75  0.44 - 1.00 mg/dL Final  . Calcium 03/10/2019 9.0  8.9 - 10.3 mg/dL Final  . GFR calc non Af Amer 03/10/2019 >60  >60 mL/min Final  . GFR calc Af Amer 03/10/2019 >60  >60 mL/min Final  . Anion gap 03/10/2019 8  5 - 15 Final   Performed at [facility], [place] [address], [place],  [address]   . WBC 03/11/2019 15.1* 4.0 - 10.5 K/uL Final  . RBC 03/11/2019 3.77* 3.87 - 5.11 MIL/uL Final  . Hemoglobin  03/11/2019 10.1* 12.0 - 15.0 g/dL Final  . HCT 03/11/2019 33.4* 36.0 - 46.0 % Final  . MCV 03/11/2019 88.6  80.0 - 100.0 fL Final  . MCH 03/11/2019 26.8  26.0 - 34.0 pg Final  . MCHC 03/11/2019 30.2  30.0 - 36.0 g/dL Final  . RDW 03/11/2019 15.4  11.5 - 15.5 % Final  . Platelets 03/11/2019 336  150 - 400 K/uL Final  . nRBC 03/11/2019 0.0  0.0 - 0.2 % Final   Performed at [facility], [place] [address], [place], [place] [address]  . Sodium 03/11/2019 139  135 - 145 mmol/L Final  . Potassium 03/11/2019 3.6  3.5 - 5.1 mmol/L Final  . Chloride 03/11/2019 105  98 - 111 mmol/L Final  . CO2 03/11/2019 26  22 - 32 mmol/L Final  . Glucose, Bld 03/11/2019 118* 70 - 99 mg/dL Final   Glucose reference range applies only to samples taken after fasting for at least 8 hours.  . BUN 03/11/2019 21  8 - 23 mg/dL Final  . Creatinine, Ser 03/11/2019 0.88  0.44 - 1.00 mg/dL Final  . Calcium 03/11/2019 9.1  8.9 - 10.3 mg/dL Final  . GFR calc non Af Amer 03/11/2019 >60  >60 mL/min Final  . GFR calc Af Amer 03/11/2019 >60  >60 mL/min Final  . Anion gap 03/11/2019 8  5 - 15 Final   Performed at [facility] [place] [address] [place], [place] [address]  Hospital Outpatient Visit on 03/06/2019  Component Date Value Ref Range Status  . SARS Coronavirus 2 03/06/2019 NEGATIVE  NEGATIVE Final   Comment: (NOTE) SARS-CoV-2 target nucleic acids are NOT DETECTED. The SARS-CoV-2 RNA is generally detectable in upper and lower respiratory specimens during the acute phase of infection. Negative results do not preclude SARS-CoV-2 infection, do not rule out co-infections with other pathogens, and should not be used as the sole basis for treatment or other patient management decisions. Negative results must be combined with clinical observations, patient history, and  epidemiological information. The expected result is Negative. Fact Sheet for Patients: SugarRoll.be Fact Sheet for Healthcare Providers: https://www.woods-mathews.com/ This test is not yet approved or cleared by the [place] FDA and  has been authorized for detection and/or diagnosis of SARS-CoV-2 by FDA under an Emergency Use Authorization (EUA). This EUA will remain  in effect (meaning this test can be used) for the duration of the COVID-19 declaration under Section 56                          4(b)(1) of the Act, 21 U.S.C. section 360bbb-3(b)(1), unless the authorization is terminated or revoked sooner. Performed at [facility], [place] [address] [place], [place] [address]       Hospital Course: Ashley Mercer is a 79 y.o. who was admitted to [facility]. They were brought to the operating room on 03/09/2019 and underwent Procedure(s): TOTAL KNEE ARTHROPLASTY.  Patient tolerated the procedure well and was later transferred to the recovery room and then to the orthopaedic floor for postoperative care.  They were given PO and IV analgesics for pain control following their surgery.  They were given 24 hours of postoperative antibiotics of  Anti-infectives (From admission, onward)   Start     Dose/Rate Route Frequency Ordered Stop   03/09/19 1830  ceFAZolin (ANCEF) IVPB 2g/100 mL premix     2 g 200 mL/hr over 30 Minutes  Intravenous Every 6 hours 03/09/19 1030 03/09/19 2309   03/09/19 1015  ceFAZolin (ANCEF) IVPB 2g/100 mL premix     2 g 200 mL/hr over 30 Minutes Intravenous On call to O.R. 03/09/19 1010 03/09/19 1245     and started on DVT prophylaxis in the form of Aspirin.   PT and OT were ordered for total joint protocol.  Discharge planning consulted to help with postop disposition and equipment needs.  Patient had a fair night on the evening of surgery.  They started to get up OOB with therapy on day one. Hemovac drain was  pulled without difficulty.  Continued to work with therapy into day two.  Dressing was changed on day two and the incision was clean and dry.  The patient had progressed with therapy and meeting their goals.  Incision was healing well.  Patient was seen in rounds and was ready to go home with HHPT.   Diet: Cardiac diet Activity:WBAT Follow-up:in 2 weeks Disposition - Home Discharged Condition: stable   Discharge Instructions    Call MD / Call 911   Complete by: As directed    If you experience chest pain or shortness of breath, CALL 911 and be transported to the hospital emergency room.  If you develope a fever above 101 F, pus (white drainage) or increased drainage or redness at the wound, or calf pain, call your surgeon's office.   Change dressing   Complete by: As directed    Change dressing on Wednesday, then change the dressing daily with sterile 4 x 4 inch gauze dressing and apply TED hose.   Constipation Prevention   Complete by: As directed    Drink plenty of fluids.  Prune juice may be helpful.  You may use a stool softener, such as Colace (over the counter) 100 mg twice a day.  Use MiraLax (over the counter) for constipation as needed.   Diet - low sodium heart healthy   Complete by: As directed    Do not put a pillow under the knee. Place it under the heel.   Complete by: As directed    Driving restrictions   Complete by: As directed    No driving for two weeks   TED hose   Complete by: As directed    Use stockings (TED hose) for three weeks on both leg(s).  You may remove them at night for sleeping.   Weight bearing as tolerated   Complete by: As directed      Allergies as of 03/11/2019   No Known Allergies     Medication List    STOP taking these medications   ibuprofen 800 MG tablet Commonly known as: ADVIL     TAKE these medications   acetaminophen 650 MG CR tablet Commonly known as: TYLENOL Take 1,300 mg by mouth every 8 (eight) hours as needed for pain.    amLODipine 5 MG tablet Commonly known as: NORVASC Take 5 mg by mouth daily.   aspirin 325 MG EC tablet Take 1 tablet (325 mg total) by mouth in the morning and at bedtime for 20 days. Then resume one 81 mg aspirin once a day. What changed:   medication strength  how much to take  when to take this  additional instructions   CALCIUM-MAGNESIUM PO Take 1 tablet by mouth daily.   clobetasol ointment 0.05 % Commonly known as: TEMOVATE Apply 1 application topically daily as needed (eczema).   colesevelam 625 MG tablet Commonly known as: [employer]  Take 1,250 mg by mouth 2 (two) times daily with a meal.   cyclobenzaprine 10 MG tablet Commonly known as: FLEXERIL Take 1 tablet (10 mg total) by mouth 3 (three) times daily as needed for muscle spasms.   DULoxetine 60 MG capsule Commonly known as: CYMBALTA Take 60 mg by mouth at bedtime.   famotidine 40 MG tablet Commonly known as: PEPCID Take 40 mg by mouth at bedtime.   furosemide 20 MG tablet Commonly known as: LASIX Take 20 mg by mouth daily.   levothyroxine 100 MCG tablet Commonly known as: SYNTHROID Take 100 mcg by mouth daily before breakfast.   Lopid 600 MG tablet Generic drug: gemfibrozil Take 600 mg by mouth 2 (two) times daily.   metoprolol succinate 100 MG 24 hr tablet Commonly known as: TOPROL-XL Take 100 mg by mouth daily.   multivitamin with minerals Tabs tablet Take 1 tablet by mouth daily.   oxyCODONE 5 MG immediate release tablet Commonly known as: Oxy IR/ROXICODONE Take 1-2 tablets (5-10 mg total) by mouth every 6 (six) hours as needed for moderate pain or severe pain.   pregabalin 75 MG capsule Commonly known as: LYRICA Take 75 mg by mouth 2 (two) times daily.   quinapril-hydrochlorothiazide 20-25 MG tablet Commonly known as: ACCURETIC Take 1 tablet by mouth daily.   raloxifene 60 MG tablet Commonly known as: EVISTA Take 60 mg by mouth daily.            Discharge Care  Instructions  (From admission, onward)         Start     Ordered   03/10/19 0000  Weight bearing as tolerated     03/10/19 0729   03/10/19 0000  Change dressing    Comments: Change dressing on Wednesday, then change the dressing daily with sterile 4 x 4 inch gauze dressing and apply TED hose.   03/10/19 0729         Follow-up Information    Gaynelle Arabian, MD. Go on 03/24/2019.   Specialty: Orthopedic Surgery Why: You are scheduled for a post-operative appointment on 03-24-19 at 2:45 pm.  Contact information: [address] [place] [address] [id number]        Rosilyn Mings.. Go on 03/12/2019.   Why: You are scheduled for a physical therapy appointment on 03-12-19 at 2:30 pm.  Contact information: [place] [address] [id number]           Signed: Ardeen Jourdain, PA-C Orthopaedic Surgery 03/12/2019, 11:51 AM

## 2019-03-13 DIAGNOSIS — M4316 Spondylolisthesis, lumbar region: Secondary | ICD-10-CM | POA: Diagnosis not present

## 2019-03-13 DIAGNOSIS — Z471 Aftercare following joint replacement surgery: Secondary | ICD-10-CM | POA: Diagnosis not present

## 2019-03-13 DIAGNOSIS — M48062 Spinal stenosis, lumbar region with neurogenic claudication: Secondary | ICD-10-CM | POA: Diagnosis not present

## 2019-03-13 DIAGNOSIS — G8929 Other chronic pain: Secondary | ICD-10-CM | POA: Diagnosis not present

## 2019-03-13 DIAGNOSIS — M4712 Other spondylosis with myelopathy, cervical region: Secondary | ICD-10-CM | POA: Diagnosis not present

## 2019-03-13 DIAGNOSIS — M4802 Spinal stenosis, cervical region: Secondary | ICD-10-CM | POA: Diagnosis not present

## 2019-03-16 DIAGNOSIS — M4316 Spondylolisthesis, lumbar region: Secondary | ICD-10-CM | POA: Diagnosis not present

## 2019-03-16 DIAGNOSIS — M48062 Spinal stenosis, lumbar region with neurogenic claudication: Secondary | ICD-10-CM | POA: Diagnosis not present

## 2019-03-16 DIAGNOSIS — G8929 Other chronic pain: Secondary | ICD-10-CM | POA: Diagnosis not present

## 2019-03-16 DIAGNOSIS — M4712 Other spondylosis with myelopathy, cervical region: Secondary | ICD-10-CM | POA: Diagnosis not present

## 2019-03-16 DIAGNOSIS — Z471 Aftercare following joint replacement surgery: Secondary | ICD-10-CM | POA: Diagnosis not present

## 2019-03-16 DIAGNOSIS — M4802 Spinal stenosis, cervical region: Secondary | ICD-10-CM | POA: Diagnosis not present

## 2019-03-18 DIAGNOSIS — M4316 Spondylolisthesis, lumbar region: Secondary | ICD-10-CM | POA: Diagnosis not present

## 2019-03-18 DIAGNOSIS — Z471 Aftercare following joint replacement surgery: Secondary | ICD-10-CM | POA: Diagnosis not present

## 2019-03-18 DIAGNOSIS — M48062 Spinal stenosis, lumbar region with neurogenic claudication: Secondary | ICD-10-CM | POA: Diagnosis not present

## 2019-03-18 DIAGNOSIS — M4712 Other spondylosis with myelopathy, cervical region: Secondary | ICD-10-CM | POA: Diagnosis not present

## 2019-03-18 DIAGNOSIS — G8929 Other chronic pain: Secondary | ICD-10-CM | POA: Diagnosis not present

## 2019-03-18 DIAGNOSIS — M4802 Spinal stenosis, cervical region: Secondary | ICD-10-CM | POA: Diagnosis not present

## 2019-03-20 DIAGNOSIS — M4802 Spinal stenosis, cervical region: Secondary | ICD-10-CM | POA: Diagnosis not present

## 2019-03-20 DIAGNOSIS — M4712 Other spondylosis with myelopathy, cervical region: Secondary | ICD-10-CM | POA: Diagnosis not present

## 2019-03-20 DIAGNOSIS — M48062 Spinal stenosis, lumbar region with neurogenic claudication: Secondary | ICD-10-CM | POA: Diagnosis not present

## 2019-03-20 DIAGNOSIS — Z471 Aftercare following joint replacement surgery: Secondary | ICD-10-CM | POA: Diagnosis not present

## 2019-03-20 DIAGNOSIS — M4316 Spondylolisthesis, lumbar region: Secondary | ICD-10-CM | POA: Diagnosis not present

## 2019-03-20 DIAGNOSIS — G8929 Other chronic pain: Secondary | ICD-10-CM | POA: Diagnosis not present

## 2019-03-23 DIAGNOSIS — M4712 Other spondylosis with myelopathy, cervical region: Secondary | ICD-10-CM | POA: Diagnosis not present

## 2019-03-23 DIAGNOSIS — G8929 Other chronic pain: Secondary | ICD-10-CM | POA: Diagnosis not present

## 2019-03-23 DIAGNOSIS — Z471 Aftercare following joint replacement surgery: Secondary | ICD-10-CM | POA: Diagnosis not present

## 2019-03-23 DIAGNOSIS — M4802 Spinal stenosis, cervical region: Secondary | ICD-10-CM | POA: Diagnosis not present

## 2019-03-23 DIAGNOSIS — M4316 Spondylolisthesis, lumbar region: Secondary | ICD-10-CM | POA: Diagnosis not present

## 2019-03-23 DIAGNOSIS — M48062 Spinal stenosis, lumbar region with neurogenic claudication: Secondary | ICD-10-CM | POA: Diagnosis not present

## 2019-03-25 DIAGNOSIS — M4316 Spondylolisthesis, lumbar region: Secondary | ICD-10-CM | POA: Diagnosis not present

## 2019-03-25 DIAGNOSIS — Z471 Aftercare following joint replacement surgery: Secondary | ICD-10-CM | POA: Diagnosis not present

## 2019-03-25 DIAGNOSIS — M4712 Other spondylosis with myelopathy, cervical region: Secondary | ICD-10-CM | POA: Diagnosis not present

## 2019-03-25 DIAGNOSIS — G8929 Other chronic pain: Secondary | ICD-10-CM | POA: Diagnosis not present

## 2019-03-25 DIAGNOSIS — M48062 Spinal stenosis, lumbar region with neurogenic claudication: Secondary | ICD-10-CM | POA: Diagnosis not present

## 2019-03-25 DIAGNOSIS — M4802 Spinal stenosis, cervical region: Secondary | ICD-10-CM | POA: Diagnosis not present

## 2019-03-30 DIAGNOSIS — Z471 Aftercare following joint replacement surgery: Secondary | ICD-10-CM | POA: Diagnosis not present

## 2019-03-30 DIAGNOSIS — G8929 Other chronic pain: Secondary | ICD-10-CM | POA: Diagnosis not present

## 2019-03-30 DIAGNOSIS — M48062 Spinal stenosis, lumbar region with neurogenic claudication: Secondary | ICD-10-CM | POA: Diagnosis not present

## 2019-03-30 DIAGNOSIS — M4316 Spondylolisthesis, lumbar region: Secondary | ICD-10-CM | POA: Diagnosis not present

## 2019-03-30 DIAGNOSIS — M4802 Spinal stenosis, cervical region: Secondary | ICD-10-CM | POA: Diagnosis not present

## 2019-03-30 DIAGNOSIS — M4712 Other spondylosis with myelopathy, cervical region: Secondary | ICD-10-CM | POA: Diagnosis not present

## 2019-04-01 DIAGNOSIS — M48062 Spinal stenosis, lumbar region with neurogenic claudication: Secondary | ICD-10-CM | POA: Diagnosis not present

## 2019-04-01 DIAGNOSIS — Z471 Aftercare following joint replacement surgery: Secondary | ICD-10-CM | POA: Diagnosis not present

## 2019-04-01 DIAGNOSIS — M4802 Spinal stenosis, cervical region: Secondary | ICD-10-CM | POA: Diagnosis not present

## 2019-04-01 DIAGNOSIS — M4316 Spondylolisthesis, lumbar region: Secondary | ICD-10-CM | POA: Diagnosis not present

## 2019-04-01 DIAGNOSIS — G8929 Other chronic pain: Secondary | ICD-10-CM | POA: Diagnosis not present

## 2019-04-01 DIAGNOSIS — M4712 Other spondylosis with myelopathy, cervical region: Secondary | ICD-10-CM | POA: Diagnosis not present

## 2019-04-07 DIAGNOSIS — M25661 Stiffness of right knee, not elsewhere classified: Secondary | ICD-10-CM | POA: Diagnosis not present

## 2019-04-07 DIAGNOSIS — M25561 Pain in right knee: Secondary | ICD-10-CM | POA: Diagnosis not present

## 2019-04-11 ENCOUNTER — Ambulatory Visit: Payer: Medicare Other | Attending: Internal Medicine

## 2019-04-11 DIAGNOSIS — Z471 Aftercare following joint replacement surgery: Secondary | ICD-10-CM | POA: Diagnosis not present

## 2019-04-11 DIAGNOSIS — Z23 Encounter for immunization: Secondary | ICD-10-CM

## 2019-04-11 NOTE — Progress Notes (Signed)
   Covid-19 Vaccination Clinic  Name:  Ashley Mercer    MRN: [id number] DOB: 03-12-1940  04/11/2019  Ashley Mercer was observed post Covid-19 immunization for 15 minutes without incident. She was provided with Vaccine Information Sheet and instruction to access the V-Safe system.   Ashley Mercer was instructed to call 911 with any severe reactions post vaccine: Marland Kitchen Difficulty breathing  . Swelling of face and throat  . A fast heartbeat  . A bad rash all over body  . Dizziness and weakness   Immunizations Administered    Name Date Dose VIS Date Route   Pfizer COVID-19 Vaccine 04/11/2019  2:52 PM 0.3 mL 12/26/2018 Intramuscular   Manufacturer: [place]   Lot: [id number]   [place]: [id number]

## 2019-04-14 DIAGNOSIS — Z96651 Presence of right artificial knee joint: Secondary | ICD-10-CM | POA: Diagnosis not present

## 2019-04-16 ENCOUNTER — Other Ambulatory Visit: Payer: Self-pay

## 2019-04-16 ENCOUNTER — Ambulatory Visit (INDEPENDENT_AMBULATORY_CARE_PROVIDER_SITE_OTHER): Payer: Medicare Other | Admitting: Neurology

## 2019-04-16 DIAGNOSIS — R531 Weakness: Secondary | ICD-10-CM | POA: Diagnosis not present

## 2019-04-16 DIAGNOSIS — Z0289 Encounter for other administrative examinations: Secondary | ICD-10-CM

## 2019-04-16 DIAGNOSIS — M4802 Spinal stenosis, cervical region: Secondary | ICD-10-CM

## 2019-04-16 DIAGNOSIS — G992 Myelopathy in diseases classified elsewhere: Secondary | ICD-10-CM

## 2019-04-16 NOTE — Progress Notes (Signed)
See procedure note.

## 2019-04-20 NOTE — Progress Notes (Signed)
Full Name: Ashley Mercer Gender: Female MRN #: [id number] Date of Birth: [birth year]    Visit Date: 04/16/2019 09:15 Age: 79 Years Requesting Physician: Maury Dus, MD and Ashok Pall MD  Performing Physician: Sarina Ill, MD  History: Reported progressive weakness in the arms and legs and gait abnormality  Summary: EMG/NCS was performed on the bilateral upper and right lower extremities. The right Peroneal motor nerve showed no response. The right Tibial motor nerve showed reduced amplitude(1.10mV,N>4). The right sural sensory nerve showed no response. The right superficial peroneal sensory nerve showed no response. The right median sensory nerve showed delayed distal peak latency(3.67ms, N<3.4) and reduced amplitude(8uV, N>10). The right Tibial F wave showed delayed latency(58.61ms, N<56). All remaining nerves (as indicated in the following tables) were within normal limits.  All muscles (as indicated in the following tables) were within normal limits.     Conclusion:  1. 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  2. Abnormal lower extremity motor conductions and delayed F Wave could be due to polyneuropathy or may be residual from remote lumbar radiculopathy. 3. Isolated abnormal right median sensory conduction does not fulfill electrodiagnostic criteria for CTS and may signify early/mild right median neuropathy at the wrist but is of uncertain clinical significance.  4. EMG needle examination was normal; No myopathy/myositis or other muscle disease or evidence for acute/ongoing radiculopathy. 5. Advised patient to follow up with neurosurgery regarding severe c3/c4 cervical stenosis.  Sarina Ill M.D.  [facility] [address] [place], [place] [address] Tel: [phone] Fax: [phone]   cc: Maury Dus, MD and Ashok Pall MD      [facility]    Nerve / Sites Muscle Latency Ref. Amplitude Ref. Rel Amp Segments Distance Velocity Ref.  Area    ms ms mV mV %  cm m/s m/s mVms  L Median - APB     Wrist APB 4.3 ?4.4 6.9 ?4.0 100 Wrist - APB 7   31.2     Upper arm APB 8.0  6.4  92.3 Upper arm - Wrist 22 60 ?49 28.5  R Median - APB     Wrist APB 4.3 ?4.4 5.9 ?4.0 100 Wrist - APB 7   23.5     Upper arm APB 8.2  6.0  102 Upper arm - Wrist 22 56 ?49 23.6  L Ulnar - ADM     Wrist ADM 3.1 ?3.3 6.9 ?6.0 100 Wrist - ADM 7   17.6     B.Elbow ADM 6.7  7.4  107 B.Elbow - Wrist 20 55 ?49 19.2     A.Elbow ADM 8.4  7.8  106 A.Elbow - B.Elbow 10 62 ?49 23.2  R Ulnar - ADM     Wrist ADM 3.2 ?3.3 9.3 ?6.0 100 Wrist - ADM 7   27.8     B.Elbow ADM 7.0  9.0  96.7 B.Elbow - Wrist 19 50 ?49 27.5     A.Elbow ADM 9.0  8.5  95.2 A.Elbow - B.Elbow 10 50 ?49 26.7  R Peroneal - EDB     Ankle EDB NR ?6.5 NR ?2.0 NR Ankle - EDB 9   NR     Fib head EDB NR  NR  NR Fib head - Ankle 23 NR ?44 NR  R Tibial - AH     Ankle AH 4.6 ?5.8 1.1 ?4.0 100 Ankle - AH 9   4.2     Pop fossa AH 11.5  0.2  18.8 Pop fossa - Ankle 37 53 ?41 0.7                  SNC    Nerve / Sites Rec. Site Peak Lat Ref.  Amp Ref. Segments Distance Peak Diff Ref.    ms ms V V  cm ms ms  R Sural - Ankle (Calf)     Calf Ankle NR ?4.4 NR ?6 Calf - Ankle 14    R Superficial peroneal - Ankle     Lat leg Ankle NR ?4.4 NR ?6 Lat leg - Ankle 14    L Median, Ulnar - Transcarpal comparison     Median Palm Wrist 2.5 ?2.2 51 ?35 Median Palm - Wrist 8       Ulnar Palm Wrist 2.5 ?2.2 14 ?12 Ulnar Palm - Wrist 8          Median Palm - Ulnar Palm  0.0 ?0.4  R Median, Ulnar - Transcarpal comparison     Median Palm Wrist 2.6 ?2.2 45 ?35 Median Palm - Wrist 8       Ulnar Palm Wrist 2.4 ?2.2 17 ?12 Ulnar Palm - Wrist 8          Median Palm - Ulnar Palm  0.2 ?0.4  L Median - Orthodromic (Dig II, Mid palm)     Dig II Wrist 3.4 ?3.4 11 ?10 Dig II - Wrist 13    R Median - Orthodromic (Dig II, Mid palm)     Dig II Wrist 3.9 ?3.4 8 ?10 Dig II - Wrist 13    L Ulnar - Orthodromic, (Dig V, Mid palm)      Dig V Wrist 3.1 ?3.1 5 ?5 Dig V - Wrist 11    R Ulnar - Orthodromic, (Dig V, Mid palm)     Dig V Wrist 3.1 ?3.1 10 ?5 Dig V - Wrist 65                       F  Wave    Nerve F Lat Ref.   ms ms  L Ulnar - ADM 27.7 ?32.0  R Ulnar - ADM 29.2 ?32.0  R Tibial - AH 58.4 ?56.0           EMG Summary Table    Spontaneous MUAP Recruitment  Muscle IA Fib PSW Fasc Other Amp Dur. Poly Pattern  R. Biceps brachii Normal None None None _______ Normal Normal Normal Normal  R. Triceps brachii Normal None None None _______ Normal Normal Normal Normal  R. Pronator teres Normal None None None _______ Normal Normal Normal Normal  R. First dorsal interosseous Normal None None None _______ Normal Normal Normal Normal  R. Opponens pollicis Normal None None None _______ Normal Normal Normal Normal  R. Iliopsoas Normal None None None _______ Normal Normal Normal Normal  R. Vastus medialis Normal None None None _______ Normal Normal Normal Normal  R. Gastrocnemius (Medial head) Normal None None None _______ Normal Normal Normal Normal  R. Tibialis anterior Normal None None None _______ Normal Normal Normal Normal  R. Extensor hallucis longus Normal None None None _______ Normal Normal Normal Normal

## 2019-04-20 NOTE — Procedures (Signed)
Full Name: Ashley Mercer Gender: Female MRN #: [id number] Date of Birth: 11/11/1940    Visit Date: 04/16/2019 09:15 Age: 79 Years Requesting Physician: Maury Dus, MD and Ashok Pall MD  Performing Physician: Sarina Ill, MD  History: Reported progressive weakness in the arms and legs and gait abnormality  Summary: EMG/NCS was performed on the bilateral upper and right lower extremities. The right Peroneal motor nerve showed no response. The right Tibial motor nerve showed reduced amplitude(1.18mV,N>4). The right sural sensory nerve showed no response. The right superficial peroneal sensory nerve showed no response. The right median sensory nerve showed delayed distal peak latency(3.22ms, N<3.4) and reduced amplitude(8uV, N>10). The right Tibial F wave showed delayed latency(58.74ms, N<56). All remaining nerves (as indicated in the following tables) were within normal limits.  All muscles (as indicated in the following tables) were within normal limits.     Conclusion:  1. 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  2. Abnormal lower extremity motor conductions and delayed F Wave could be due to polyneuropathy or may be residual from remote lumbar radiculopathy. 3. Isolated abnormal right median sensory conduction does not fulfill electrodiagnostic criteria for CTS and may signify early/mild right median neuropathy at the wrist but is of uncertain clinical significance.  4. EMG needle examination was normal; No myopathy/myositis or other muscle disease or evidence for acute/ongoing radiculopathy. 5. Advised patient to follow up with neurosurgery regarding severe c3/c4 cervical stenosis.  Sarina Ill M.D.  [facility] [address] [place], [place] [address] Tel: [phone] Fax: [phone]   cc: Maury Dus, MD and Ashok Pall MD      [facility]    Nerve / Sites Muscle Latency Ref. Amplitude Ref. Rel Amp Segments Distance Velocity Ref.  Area    ms ms mV mV %  cm m/s m/s mVms  L Median - APB     Wrist APB 4.3 ?4.4 6.9 ?4.0 100 Wrist - APB 7   31.2     Upper arm APB 8.0  6.4  92.3 Upper arm - Wrist 22 60 ?49 28.5  R Median - APB     Wrist APB 4.3 ?4.4 5.9 ?4.0 100 Wrist - APB 7   23.5     Upper arm APB 8.2  6.0  102 Upper arm - Wrist 22 56 ?49 23.6  L Ulnar - ADM     Wrist ADM 3.1 ?3.3 6.9 ?6.0 100 Wrist - ADM 7   17.6     B.Elbow ADM 6.7  7.4  107 B.Elbow - Wrist 20 55 ?49 19.2     A.Elbow ADM 8.4  7.8  106 A.Elbow - B.Elbow 10 62 ?49 23.2  R Ulnar - ADM     Wrist ADM 3.2 ?3.3 9.3 ?6.0 100 Wrist - ADM 7   27.8     B.Elbow ADM 7.0  9.0  96.7 B.Elbow - Wrist 19 50 ?49 27.5     A.Elbow ADM 9.0  8.5  95.2 A.Elbow - B.Elbow 10 50 ?49 26.7  R Peroneal - EDB     Ankle EDB NR ?6.5 NR ?2.0 NR Ankle - EDB 9   NR     Fib head EDB NR  NR  NR Fib head - Ankle 23 NR ?44 NR  R Tibial - AH     Ankle AH 4.6 ?5.8 1.1 ?4.0 100 Ankle - AH 9   4.2     Pop fossa AH 11.5  0.2  18.8 Pop fossa - Ankle 37 53 ?41 0.7                  SNC    Nerve / Sites Rec. Site Peak Lat Ref.  Amp Ref. Segments Distance Peak Diff Ref.    ms ms V V  cm ms ms  R Sural - Ankle (Calf)     Calf Ankle NR ?4.4 NR ?6 Calf - Ankle 14    R Superficial peroneal - Ankle     Lat leg Ankle NR ?4.4 NR ?6 Lat leg - Ankle 14    L Median, Ulnar - Transcarpal comparison     Median Palm Wrist 2.5 ?2.2 51 ?35 Median Palm - Wrist 8       Ulnar Palm Wrist 2.5 ?2.2 14 ?12 Ulnar Palm - Wrist 8          Median Palm - Ulnar Palm  0.0 ?0.4  R Median, Ulnar - Transcarpal comparison     Median Palm Wrist 2.6 ?2.2 45 ?35 Median Palm - Wrist 8       Ulnar Palm Wrist 2.4 ?2.2 17 ?12 Ulnar Palm - Wrist 8          Median Palm - Ulnar Palm  0.2 ?0.4  L Median - Orthodromic (Dig II, Mid palm)     Dig II Wrist 3.4 ?3.4 11 ?10 Dig II - Wrist 13    R Median - Orthodromic (Dig II, Mid palm)     Dig II Wrist 3.9 ?3.4 8 ?10 Dig II - Wrist 13    L Ulnar - Orthodromic, (Dig V, Mid palm)      Dig V Wrist 3.1 ?3.1 5 ?5 Dig V - Wrist 11    R Ulnar - Orthodromic, (Dig V, Mid palm)     Dig V Wrist 3.1 ?3.1 10 ?5 Dig V - Wrist 72                       F  Wave    Nerve F Lat Ref.   ms ms  L Ulnar - ADM 27.7 ?32.0  R Ulnar - ADM 29.2 ?32.0  R Tibial - AH 58.4 ?56.0           EMG Summary Table    Spontaneous MUAP Recruitment  Muscle IA Fib PSW Fasc Other Amp Dur. Poly Pattern  R. Biceps brachii Normal None None None _______ Normal Normal Normal Normal  R. Triceps brachii Normal None None None _______ Normal Normal Normal Normal  R. Pronator teres Normal None None None _______ Normal Normal Normal Normal  R. First dorsal interosseous Normal None None None _______ Normal Normal Normal Normal  R. Opponens pollicis Normal None None None _______ Normal Normal Normal Normal  R. Iliopsoas Normal None None None _______ Normal Normal Normal Normal  R. Vastus medialis Normal None None None _______ Normal Normal Normal Normal  R. Gastrocnemius (Medial head) Normal None None None _______ Normal Normal Normal Normal  R. Tibialis anterior Normal None None None _______ Normal Normal Normal Normal  R. Extensor hallucis longus Normal None None None _______ Normal Normal Normal Normal

## 2019-04-20 NOTE — Progress Notes (Signed)
Patient here for emg/ncs evaluation to assess for sensory changes and weakness. I had a long discussion with daughter and patient and reviewed MRI of the cervical spine images showing severe compression in the cord at c3/c4. We also reviewed prior MRI in 2011 with severe compression at c4/c5 in the past. I don't  have any other MRIs inbetween these 2 to compare and see how long she has had the c3/c4 compressiom. No other etiology found on emg/ncs and I encouraged then to follow up with their neurosurgeon for evaluation.   12/05/2018: IMPRESSION: 1. C3-4 degenerative cord compression from advanced facet arthropathy, anterolisthesis, disc protrusion. This is considered the symptomatic finding. 2. T1-2 advanced biforaminal impingement from disc degeneration. 3. C4-5 to C6-7 ACDF. No arthrodesis was seen at the C6-7 level on a 2016 CT. 4. Myelomalacia at C4-5  I spent 25 minutes of face-to-face and non-face-to-face time with patient on the  1. Stenosis of cervical spine with myelopathy (HCC)    diagnosis.  This included previsit chart review, lab review, study review, order entry, electronic health record documentation, patient education on the different diagnostic and therapeutic options, counseling and coordination of care, risks and benefits of management, compliance, or risk factor reduction. This does not include time spent on emg/ncs.    cc: Maury Dus, MD and Ashok Pall MD

## 2019-04-21 ENCOUNTER — Other Ambulatory Visit: Payer: Self-pay | Admitting: Neurosurgery

## 2019-04-21 DIAGNOSIS — M4712 Other spondylosis with myelopathy, cervical region: Secondary | ICD-10-CM | POA: Diagnosis not present

## 2019-04-21 DIAGNOSIS — Z6839 Body mass index (BMI) 39.0-39.9, adult: Secondary | ICD-10-CM | POA: Diagnosis not present

## 2019-04-21 DIAGNOSIS — I10 Essential (primary) hypertension: Secondary | ICD-10-CM | POA: Diagnosis not present

## 2019-04-23 ENCOUNTER — Encounter: Payer: Medicare Other | Admitting: Neurology

## 2019-04-29 ENCOUNTER — Other Ambulatory Visit (HOSPITAL_COMMUNITY)
Admission: RE | Admit: 2019-04-29 | Discharge: 2019-04-29 | Disposition: A | Discharge status: Home / Self Care | Payer: Medicare Other | Source: Ambulatory Visit | Attending: Neurosurgery | Admitting: Neurosurgery

## 2019-04-29 ENCOUNTER — Other Ambulatory Visit: Payer: Self-pay

## 2019-04-29 ENCOUNTER — Encounter (HOSPITAL_COMMUNITY): Payer: Self-pay

## 2019-04-29 ENCOUNTER — Encounter (HOSPITAL_COMMUNITY)
Admission: RE | Admit: 2019-04-29 | Discharge: 2019-04-29 | Disposition: A | Discharge status: Home / Self Care | Payer: Medicare Other | Source: Ambulatory Visit | Attending: Neurosurgery | Admitting: Neurosurgery

## 2019-04-29 DIAGNOSIS — Z20822 Contact with and (suspected) exposure to covid-19: Secondary | ICD-10-CM | POA: Diagnosis not present

## 2019-04-29 DIAGNOSIS — Z01818 Encounter for other preprocedural examination: Secondary | ICD-10-CM | POA: Diagnosis not present

## 2019-04-29 LAB — SURGICAL PCR SCREEN
MRSA, PCR: NEGATIVE
Staphylococcus aureus: POSITIVE — AB

## 2019-04-29 LAB — CBC
HCT: 43.4 % (ref 36.0–46.0)
Hemoglobin: 13.1 g/dL (ref 12.0–15.0)
MCH: 25.7 pg — ABNORMAL LOW (ref 26.0–34.0)
MCHC: 30.2 g/dL (ref 30.0–36.0)
MCV: 85.3 fL (ref 80.0–100.0)
Platelets: 402 10*3/uL — ABNORMAL HIGH (ref 150–400)
RBC: 5.09 MIL/uL (ref 3.87–5.11)
RDW: 14.7 % (ref 11.5–15.5)
WBC: 9.2 10*3/uL (ref 4.0–10.5)
nRBC: 0 % (ref 0.0–0.2)

## 2019-04-29 LAB — BASIC METABOLIC PANEL
Anion gap: 10 (ref 5–15)
BUN: 14 mg/dL (ref 8–23)
CO2: 27 mmol/L (ref 22–32)
Calcium: 9.8 mg/dL (ref 8.9–10.3)
Chloride: 103 mmol/L (ref 98–111)
Creatinine, Ser: 0.83 mg/dL (ref 0.44–1.00)
GFR calc Af Amer: >60 mL/min (ref >60)
GFR calc non Af Amer: >60 mL/min (ref >60)
Glucose, Bld: 111 mg/dL — ABNORMAL HIGH (ref 70–99)
Potassium: 3.7 mmol/L (ref 3.5–5.1)
Sodium: 140 mmol/L (ref 135–145)

## 2019-04-29 LAB — SARS CORONAVIRUS 2 (TAT 6-24 HRS): SARS Coronavirus 2: NEGATIVE

## 2019-04-29 NOTE — Pre-Procedure Instructions (Signed)
[id number], [address] [place] [address] Phone: [phone] Fax: [phone]  [facility] [place], [place] [address] Phone: [phone] Fax: [place], [address] [facility] [address] AT [address] [place] [address] Phone: [phone] Fax: [phone]      Your procedure is scheduled on Friday April 16th.  Report to [facility] Main Entrance "A" at 9:30 A.M., and check in at the Admitting office.  Call this number if you have problems the morning of surgery:  [phone]  Call [phone] if you have any questions prior to your surgery date Monday-Friday 8am-4pm    Remember:  Do not eat or drink after midnight the night before your surgery   Take these medicines the morning of surgery with A SIP OF WATER   amLODipine (NORVASC)  gemfibrozil (LOPID)     levothyroxine (SYNTHROID  metoprolol succinate (TOPROL-XL)  pregabalin (LYRICA)  raloxifene (EVISTA  acetaminophen (TYLENOL) as needed for pain  cyclobenzaprine (FLEXERIL) as needed for muscle spasms   As of today, STOP taking any Aspirin (unless otherwise instructed by your surgeon) and Aspirin containing products, Aleve, Naproxen, Ibuprofen, Motrin, Advil, Goody's, BC's, all herbal medications, fish oil, and all vitamins.                      Do not wear jewelry, make up, or nail polish            Do not wear lotions, powders, perfumes, or deodorant.            Do not shave 48 hours prior to surgery.             Do not bring valuables to the hospital.            [facility] is not responsible for any belongings or valuables.  Do NOT Smoke (Tobacco/Vapping) or drink Alcohol 24 hours prior to your procedure If you use a CPAP at night, you may bring all equipment for your overnight  stay.   Contacts, glasses, dentures or bridgework may not be worn into surgery.      For patients admitted to the hospital, discharge time will be determined by your treatment team.   Patients discharged the day of surgery will not be allowed to drive home, and someone needs to stay with them for 24 hours.    Special instructions:   [facility]- Preparing For Surgery  Before surgery, you can play an important role. Because skin is not sterile, your skin needs to be as free of germs as possible. You can reduce the number of germs on your skin by washing with CHG (chlorahexidine gluconate) Soap before surgery.  CHG is an antiseptic cleaner which kills germs and bonds with the skin to continue killing germs even after washing.    Oral Hygiene is also important to reduce your risk of infection.  Remember - BRUSH YOUR TEETH THE MORNING OF SURGERY WITH YOUR REGULAR TOOTHPASTE  Please do not use if you have an allergy to CHG or antibacterial soaps. If your skin becomes reddened/irritated stop using the CHG.  Do not shave (including legs and underarms) for at least 48 hours prior to first CHG shower. It is OK to shave your face.  Please follow these instructions carefully.  1. Shower the NIGHT BEFORE SURGERY and the MORNING OF SURGERY with CHG Soap.   2. If you chose to wash your hair, wash your hair first as usual with your normal shampoo.  3. After you shampoo, rinse your hair and body thoroughly to remove the shampoo.  4. Use CHG as you would any other liquid soap. You can apply CHG directly to the skin and wash gently with a scrungie or a clean washcloth.   5. Apply the CHG Soap to your body ONLY FROM THE NECK DOWN.  Do not use on open wounds or open sores. Avoid contact with your eyes, ears, mouth and genitals (private parts). Wash Face and genitals (private parts)  with your normal soap.   6. Wash thoroughly, paying special attention to the area where your surgery will be  performed.  7. Thoroughly rinse your body with warm water from the neck down.  8. DO NOT shower/wash with your normal soap after using and rinsing off the CHG Soap.  9. Pat yourself dry with a CLEAN TOWEL.  10. Wear CLEAN PAJAMAS to bed the night before surgery, wear comfortable clothes the morning of surgery  11. Place CLEAN SHEETS on your bed the night of your first shower and DO NOT SLEEP WITH PETS.   Day of Surgery:   Do not apply any deodorants/lotions.  Please wear clean clothes to the hospital/surgery center.   Remember to brush your teeth WITH YOUR REGULAR TOOTHPASTE.   Please read over the following fact sheets that you were given.

## 2019-04-29 NOTE — Progress Notes (Signed)
PCP - Dr. Cheyney Ludwig  Cardiologist - Denies  Chest x-ray - Denies  EKG - 04/29/19  Stress Test - Denies  ECHO - Denies  Cardiac Cath - Denies  AICD-na PM-na LOOP-na  Sleep Study - Denies CPAP - None  LABS- 04/29/19: CBC, BMP, PCR, COVID  ASA- LD- Over a wk ago  ERAS- No  HA1C- Denies   Anesthesia- No  Pt denies having chest pain, sob, or fever at this time. All instructions explained to the pt, with a verbal understanding of the material. Pt agrees to go over the instructions while at home for a better understanding. Pt also instructed to self quarantine after being tested for COVID-19. The opportunity to ask questions was provided.   Coronavirus Screening  Have you experienced the following symptoms:  Cough yes/no: No Fever (>100.66F)  yes/no: No Runny nose yes/no: No Sore throat yes/no: No Difficulty breathing/shortness of breath  yes/no: No  Have you or a family member traveled in the last 14 days and where? yes/no: No   If the patient indicates "YES" to the above questions, their PAT will be rescheduled to limit the exposure to others and, the surgeon will be notified. THE PATIENT WILL NEED TO BE ASYMPTOMATIC FOR 14 DAYS.   If the patient is not experiencing any of these symptoms, the PAT nurse will instruct them to NOT bring anyone with them to their appointment since they may have these symptoms or traveled as well.   Please remind your patients and families that hospital visitation restrictions are in effect and the importance of the restrictions.

## 2019-05-01 ENCOUNTER — Observation Stay (HOSPITAL_COMMUNITY)
Admission: RE | Admit: 2019-05-01 | Discharge: 2019-05-02 | Disposition: A | Discharge status: Home / Self Care | Payer: Medicare Other | Attending: Neurosurgery | Admitting: Neurosurgery

## 2019-05-01 ENCOUNTER — Ambulatory Visit (HOSPITAL_COMMUNITY): Payer: Medicare Other | Admitting: Anesthesiology

## 2019-05-01 ENCOUNTER — Other Ambulatory Visit: Payer: Self-pay

## 2019-05-01 ENCOUNTER — Encounter (HOSPITAL_COMMUNITY): Payer: Self-pay | Admitting: Neurosurgery

## 2019-05-01 ENCOUNTER — Encounter (HOSPITAL_COMMUNITY)
Admission: RE | Disposition: A | Discharge status: Home / Self Care | Payer: Self-pay | Source: Home / Self Care | Attending: Neurosurgery

## 2019-05-01 ENCOUNTER — Ambulatory Visit (HOSPITAL_COMMUNITY): Payer: Medicare Other

## 2019-05-01 DIAGNOSIS — E7800 Pure hypercholesterolemia, unspecified: Secondary | ICD-10-CM | POA: Insufficient documentation

## 2019-05-01 DIAGNOSIS — Z79899 Other long term (current) drug therapy: Secondary | ICD-10-CM | POA: Insufficient documentation

## 2019-05-01 DIAGNOSIS — Z87891 Personal history of nicotine dependence: Secondary | ICD-10-CM | POA: Diagnosis not present

## 2019-05-01 DIAGNOSIS — M5000 Cervical disc disorder with myelopathy, unspecified cervical region: Secondary | ICD-10-CM | POA: Diagnosis present

## 2019-05-01 DIAGNOSIS — I10 Essential (primary) hypertension: Secondary | ICD-10-CM | POA: Diagnosis not present

## 2019-05-01 DIAGNOSIS — Z419 Encounter for procedure for purposes other than remedying health state, unspecified: Secondary | ICD-10-CM

## 2019-05-01 DIAGNOSIS — M4322 Fusion of spine, cervical region: Secondary | ICD-10-CM | POA: Diagnosis not present

## 2019-05-01 DIAGNOSIS — F419 Anxiety disorder, unspecified: Secondary | ICD-10-CM | POA: Diagnosis not present

## 2019-05-01 DIAGNOSIS — M4712 Other spondylosis with myelopathy, cervical region: Principal | ICD-10-CM | POA: Insufficient documentation

## 2019-05-01 DIAGNOSIS — Z79890 Hormone replacement therapy (postmenopausal): Secondary | ICD-10-CM | POA: Insufficient documentation

## 2019-05-01 DIAGNOSIS — Z981 Arthrodesis status: Secondary | ICD-10-CM | POA: Diagnosis not present

## 2019-05-01 DIAGNOSIS — E039 Hypothyroidism, unspecified: Secondary | ICD-10-CM | POA: Diagnosis not present

## 2019-05-01 DIAGNOSIS — D638 Anemia in other chronic diseases classified elsewhere: Secondary | ICD-10-CM | POA: Diagnosis not present

## 2019-05-01 HISTORY — PX: ANTERIOR CERVICAL DECOMP/DISCECTOMY FUSION: SHX1161

## 2019-05-01 LAB — TYPE AND SCREEN
ABO/RH(D): A NEG
Antibody Screen: NEGATIVE

## 2019-05-01 SURGERY — ANTERIOR CERVICAL DECOMPRESSION/DISCECTOMY FUSION 1 LEVEL
Anesthesia: General | Site: Spine Cervical

## 2019-05-01 MED ORDER — PROPOFOL 10 MG/ML IV BOLUS
INTRAVENOUS | Status: DC | PRN
  Administered 2019-05-01: 100 mg via INTRAVENOUS

## 2019-05-01 MED ORDER — OXYCODONE HCL 5 MG PO TABS
5.0000 mg | ORAL_TABLET | ORAL | Status: DC | PRN
  Administered 2019-05-01 – 2019-05-02 (×2): 5 mg via ORAL
  Filled 2019-05-01 (×2): qty 1

## 2019-05-01 MED ORDER — HYDROCHLOROTHIAZIDE 25 MG PO TABS
25.0000 mg | ORAL_TABLET | Freq: Every day | ORAL | Status: DC
  Administered 2019-05-02: 09:00:00 25 mg via ORAL
  Filled 2019-05-01 (×2): qty 1

## 2019-05-01 MED ORDER — LIDOCAINE 2% (20 MG/ML) 5 ML SYRINGE
INTRAMUSCULAR | Status: DC | PRN
  Administered 2019-05-01: 60 mg via INTRAVENOUS

## 2019-05-01 MED ORDER — EPHEDRINE 5 MG/ML INJ
INTRAVENOUS | Status: AC
  Filled 2019-05-01: qty 10

## 2019-05-01 MED ORDER — ACETAMINOPHEN 325 MG PO TABS: 650.0000 mg | ORAL_TABLET | ORAL | Status: DC | PRN

## 2019-05-01 MED ORDER — LEVOTHYROXINE SODIUM 100 MCG PO TABS
100.0000 ug | ORAL_TABLET | Freq: Every day | ORAL | Status: DC
  Administered 2019-05-02: 100 ug via ORAL
  Filled 2019-05-01: qty 1

## 2019-05-01 MED ORDER — GEMFIBROZIL 600 MG PO TABS
600.0000 mg | ORAL_TABLET | Freq: Two times a day (BID) | ORAL | Status: DC
  Administered 2019-05-01: 600 mg via ORAL
  Filled 2019-05-01 (×3): qty 1

## 2019-05-01 MED ORDER — ACETAMINOPHEN 500 MG PO TABS: 1000.0000 mg | ORAL_TABLET | Freq: Once | ORAL | Status: DC

## 2019-05-01 MED ORDER — CELECOXIB 200 MG PO CAPS
200.0000 mg | ORAL_CAPSULE | Freq: Two times a day (BID) | ORAL | Status: DC
  Administered 2019-05-01 – 2019-05-02 (×2): 200 mg via ORAL
  Filled 2019-05-01 (×3): qty 1

## 2019-05-01 MED ORDER — DOCUSATE SODIUM 100 MG PO CAPS
100.0000 mg | ORAL_CAPSULE | Freq: Two times a day (BID) | ORAL | Status: DC
  Administered 2019-05-01 – 2019-05-02 (×2): 100 mg via ORAL
  Filled 2019-05-01 (×2): qty 1

## 2019-05-01 MED ORDER — PREGABALIN 75 MG PO CAPS
75.0000 mg | ORAL_CAPSULE | Freq: Two times a day (BID) | ORAL | Status: DC
  Administered 2019-05-01 – 2019-05-02 (×2): 75 mg via ORAL
  Filled 2019-05-01 (×2): qty 1

## 2019-05-01 MED ORDER — SODIUM CHLORIDE 0.9% FLUSH
3.0000 mL | Freq: Two times a day (BID) | INTRAVENOUS | Status: DC
  Administered 2019-05-01 – 2019-05-02 (×2): 3 mL via INTRAVENOUS

## 2019-05-01 MED ORDER — POTASSIUM CHLORIDE IN NACL 20-0.9 MEQ/L-% IV SOLN
INTRAVENOUS | Status: DC
  Filled 2019-05-01: qty 1000

## 2019-05-01 MED ORDER — EPHEDRINE SULFATE-NACL 50-0.9 MG/10ML-% IV SOSY
PREFILLED_SYRINGE | INTRAVENOUS | Status: DC | PRN
  Administered 2019-05-01 (×2): 5 mg via INTRAVENOUS

## 2019-05-01 MED ORDER — ALBUTEROL SULFATE (2.5 MG/3ML) 0.083% IN NEBU
2.5000 mg | INHALATION_SOLUTION | Freq: Four times a day (QID) | RESPIRATORY_TRACT | Status: DC | PRN
  Administered 2019-05-01: 2.5 mg via RESPIRATORY_TRACT

## 2019-05-01 MED ORDER — HEMOSTATIC AGENTS (NO CHARGE) OPTIME
TOPICAL | Status: DC | PRN
  Administered 2019-05-01: 1 via TOPICAL

## 2019-05-01 MED ORDER — SENNOSIDES-DOCUSATE SODIUM 8.6-50 MG PO TABS: 1.0000 | ORAL_TABLET | Freq: Every evening | ORAL | Status: DC | PRN

## 2019-05-01 MED ORDER — LIDOCAINE 2% (20 MG/ML) 5 ML SYRINGE
INTRAMUSCULAR | Status: AC
  Filled 2019-05-01: qty 5

## 2019-05-01 MED ORDER — QUINAPRIL-HYDROCHLOROTHIAZIDE 20-25 MG PO TABS: 1.0000 | ORAL_TABLET | Freq: Every day | ORAL | Status: DC

## 2019-05-01 MED ORDER — DEXAMETHASONE SODIUM PHOSPHATE 10 MG/ML IJ SOLN
INTRAMUSCULAR | Status: DC | PRN
  Administered 2019-05-01: 10 mg via INTRAVENOUS

## 2019-05-01 MED ORDER — DULOXETINE HCL 60 MG PO CPEP
60.0000 mg | ORAL_CAPSULE | Freq: Every day | ORAL | Status: DC
  Administered 2019-05-01: 60 mg via ORAL
  Filled 2019-05-01: qty 1

## 2019-05-01 MED ORDER — ALBUTEROL SULFATE (2.5 MG/3ML) 0.083% IN NEBU
INHALATION_SOLUTION | RESPIRATORY_TRACT | Status: AC
  Filled 2019-05-01: qty 3

## 2019-05-01 MED ORDER — ACETAMINOPHEN 10 MG/ML IV SOLN: 1000.0000 mg | Freq: Once | INTRAVENOUS | Status: DC | PRN

## 2019-05-01 MED ORDER — ONDANSETRON HCL 4 MG PO TABS: 4.0000 mg | ORAL_TABLET | Freq: Four times a day (QID) | ORAL | Status: DC | PRN

## 2019-05-01 MED ORDER — METOPROLOL SUCCINATE ER 100 MG PO TB24
100.0000 mg | ORAL_TABLET | Freq: Every day | ORAL | Status: DC
  Administered 2019-05-02: 100 mg via ORAL
  Filled 2019-05-01 (×2): qty 1

## 2019-05-01 MED ORDER — ROCURONIUM BROMIDE 10 MG/ML (PF) SYRINGE
PREFILLED_SYRINGE | INTRAVENOUS | Status: AC
  Filled 2019-05-01: qty 10

## 2019-05-01 MED ORDER — AMLODIPINE BESYLATE 5 MG PO TABS
5.0000 mg | ORAL_TABLET | Freq: Every day | ORAL | Status: DC
  Administered 2019-05-02: 5 mg via ORAL
  Filled 2019-05-01: qty 1

## 2019-05-01 MED ORDER — FUROSEMIDE 20 MG PO TABS
20.0000 mg | ORAL_TABLET | Freq: Every day | ORAL | Status: DC
  Administered 2019-05-02: 20 mg via ORAL
  Filled 2019-05-01 (×2): qty 1

## 2019-05-01 MED ORDER — FENTANYL CITRATE (PF) 100 MCG/2ML IJ SOLN
25.0000 ug | INTRAMUSCULAR | Status: DC | PRN
  Administered 2019-05-01: 50 ug via INTRAVENOUS

## 2019-05-01 MED ORDER — CHLORHEXIDINE GLUCONATE CLOTH 2 % EX PADS: 6.0000 | MEDICATED_PAD | Freq: Once | CUTANEOUS | Status: DC

## 2019-05-01 MED ORDER — OXYCODONE HCL ER 10 MG PO T12A
10.0000 mg | EXTENDED_RELEASE_TABLET | Freq: Two times a day (BID) | ORAL | Status: DC
  Administered 2019-05-01 – 2019-05-02 (×2): 10 mg via ORAL
  Filled 2019-05-01 (×2): qty 1

## 2019-05-01 MED ORDER — DEXAMETHASONE 4 MG PO TABS
4.0000 mg | ORAL_TABLET | Freq: Two times a day (BID) | ORAL | Status: DC
  Administered 2019-05-01 – 2019-05-02 (×2): 4 mg via ORAL
  Filled 2019-05-01 (×2): qty 1

## 2019-05-01 MED ORDER — DEXAMETHASONE SODIUM PHOSPHATE 10 MG/ML IJ SOLN
INTRAMUSCULAR | Status: AC
  Filled 2019-05-01: qty 1

## 2019-05-01 MED ORDER — RALOXIFENE HCL 60 MG PO TABS
60.0000 mg | ORAL_TABLET | Freq: Every day | ORAL | Status: DC
  Filled 2019-05-01 (×2): qty 1

## 2019-05-01 MED ORDER — LACTATED RINGERS IV SOLN: INTRAVENOUS | Status: DC

## 2019-05-01 MED ORDER — SUGAMMADEX SODIUM 200 MG/2ML IV SOLN
INTRAVENOUS | Status: DC | PRN
  Administered 2019-05-01: 200 mg via INTRAVENOUS

## 2019-05-01 MED ORDER — MORPHINE SULFATE (PF) 2 MG/ML IV SOLN: 1.0000 mg | INTRAVENOUS | Status: DC | PRN

## 2019-05-01 MED ORDER — LIDOCAINE-EPINEPHRINE 0.5 %-1:200000 IJ SOLN
INTRAMUSCULAR | Status: DC | PRN
  Administered 2019-05-01: 4 mL

## 2019-05-01 MED ORDER — THROMBIN 5000 UNITS EX SOLR
CUTANEOUS | Status: DC | PRN
  Administered 2019-05-01 (×2): 5000 [IU] via TOPICAL

## 2019-05-01 MED ORDER — SODIUM CHLORIDE 0.9 % IV SOLN
250.0000 mL | INTRAVENOUS | Status: DC
  Administered 2019-05-01: 250 mL via INTRAVENOUS

## 2019-05-01 MED ORDER — FENTANYL CITRATE (PF) 100 MCG/2ML IJ SOLN
INTRAMUSCULAR | Status: DC | PRN
  Administered 2019-05-01 (×5): 50 ug via INTRAVENOUS

## 2019-05-01 MED ORDER — DOCUSATE SODIUM 100 MG PO CAPS: 100.0000 mg | ORAL_CAPSULE | Freq: Every day | ORAL | Status: DC

## 2019-05-01 MED ORDER — LISINOPRIL 20 MG PO TABS
20.0000 mg | ORAL_TABLET | Freq: Every day | ORAL | Status: DC
  Administered 2019-05-02: 20 mg via ORAL
  Filled 2019-05-01 (×2): qty 1

## 2019-05-01 MED ORDER — ONDANSETRON HCL 4 MG/2ML IJ SOLN
INTRAMUSCULAR | Status: AC
  Filled 2019-05-01: qty 2

## 2019-05-01 MED ORDER — CEFAZOLIN SODIUM-DEXTROSE 2-4 GM/100ML-% IV SOLN
INTRAVENOUS | Status: AC
  Filled 2019-05-01: qty 100

## 2019-05-01 MED ORDER — PHENYLEPHRINE HCL-NACL 10-0.9 MG/250ML-% IV SOLN
INTRAVENOUS | Status: DC | PRN
  Administered 2019-05-01: 25 ug/min via INTRAVENOUS

## 2019-05-01 MED ORDER — ROCURONIUM BROMIDE 100 MG/10ML IV SOLN
INTRAVENOUS | Status: DC | PRN
  Administered 2019-05-01: 10 mg via INTRAVENOUS
  Administered 2019-05-01: 20 mg via INTRAVENOUS
  Administered 2019-05-01: 40 mg via INTRAVENOUS

## 2019-05-01 MED ORDER — ONDANSETRON HCL 4 MG/2ML IJ SOLN: 4.0000 mg | Freq: Four times a day (QID) | INTRAMUSCULAR | Status: DC | PRN

## 2019-05-01 MED ORDER — ONDANSETRON HCL 4 MG/2ML IJ SOLN: 4.0000 mg | Freq: Once | INTRAMUSCULAR | Status: DC | PRN

## 2019-05-01 MED ORDER — ZOLPIDEM TARTRATE 5 MG PO TABS: 5.0000 mg | ORAL_TABLET | Freq: Every evening | ORAL | Status: DC | PRN

## 2019-05-01 MED ORDER — FAMOTIDINE 20 MG PO TABS
40.0000 mg | ORAL_TABLET | Freq: Every day | ORAL | Status: DC
  Administered 2019-05-01: 40 mg via ORAL
  Filled 2019-05-01 (×2): qty 2

## 2019-05-01 MED ORDER — 0.9 % SODIUM CHLORIDE (POUR BTL) OPTIME
TOPICAL | Status: DC | PRN
  Administered 2019-05-01: 1000 mL

## 2019-05-01 MED ORDER — ACETAMINOPHEN 650 MG RE SUPP: 650.0000 mg | RECTAL | Status: DC | PRN

## 2019-05-01 MED ORDER — BISACODYL 5 MG PO TBEC: 5.0000 mg | DELAYED_RELEASE_TABLET | Freq: Every day | ORAL | Status: DC | PRN

## 2019-05-01 MED ORDER — FENTANYL CITRATE (PF) 100 MCG/2ML IJ SOLN
INTRAMUSCULAR | Status: AC
  Filled 2019-05-01: qty 2

## 2019-05-01 MED ORDER — SODIUM CHLORIDE 0.9% FLUSH: 3.0000 mL | INTRAVENOUS | Status: DC | PRN

## 2019-05-01 MED ORDER — MENTHOL 3 MG MT LOZG: 1.0000 | LOZENGE | OROMUCOSAL | Status: DC | PRN

## 2019-05-01 MED ORDER — DIAZEPAM 5 MG PO TABS: 5.0000 mg | ORAL_TABLET | Freq: Four times a day (QID) | ORAL | Status: DC | PRN

## 2019-05-01 MED ORDER — ADULT MULTIVITAMIN W/MINERALS CH
1.0000 | ORAL_TABLET | Freq: Every day | ORAL | Status: DC
  Filled 2019-05-01 (×2): qty 1

## 2019-05-01 MED ORDER — ONDANSETRON HCL 4 MG/2ML IJ SOLN
INTRAMUSCULAR | Status: DC | PRN
  Administered 2019-05-01: 4 mg via INTRAVENOUS

## 2019-05-01 MED ORDER — ASPIRIN EC 81 MG PO TBEC
81.0000 mg | DELAYED_RELEASE_TABLET | Freq: Every day | ORAL | Status: DC
  Administered 2019-05-02: 81 mg via ORAL
  Filled 2019-05-01: qty 1

## 2019-05-01 MED ORDER — FENTANYL CITRATE (PF) 250 MCG/5ML IJ SOLN
INTRAMUSCULAR | Status: AC
  Filled 2019-05-01: qty 5

## 2019-05-01 MED ORDER — CEFAZOLIN SODIUM-DEXTROSE 2-4 GM/100ML-% IV SOLN
2.0000 g | INTRAVENOUS | Status: AC
  Administered 2019-05-01: 2 g via INTRAVENOUS

## 2019-05-01 MED ORDER — DEXAMETHASONE SODIUM PHOSPHATE 4 MG/ML IJ SOLN: 4.0000 mg | Freq: Two times a day (BID) | INTRAMUSCULAR | Status: DC

## 2019-05-01 MED ORDER — THROMBIN 5000 UNITS EX SOLR
CUTANEOUS | Status: AC
  Filled 2019-05-01: qty 10000

## 2019-05-01 MED ORDER — COLESEVELAM HCL 625 MG PO TABS
1250.0000 mg | ORAL_TABLET | Freq: Two times a day (BID) | ORAL | Status: DC
  Filled 2019-05-01 (×2): qty 2

## 2019-05-01 MED ORDER — OXYCODONE HCL 5 MG PO TABS: 10.0000 mg | ORAL_TABLET | ORAL | Status: DC | PRN

## 2019-05-01 MED ORDER — LIDOCAINE-EPINEPHRINE 0.5 %-1:200000 IJ SOLN
INTRAMUSCULAR | Status: AC
  Filled 2019-05-01: qty 1

## 2019-05-01 MED ORDER — PHENOL 1.4 % MT LIQD: 1.0000 | OROMUCOSAL | Status: DC | PRN

## 2019-05-01 SURGICAL SUPPLY — 50 items
BLADE CLIPPER SURG (BLADE) IMPLANT
BONE VIVIGEN FORMABLE 1.3CC (Bone Implant) ×2 IMPLANT
BUR DRUM 4.0 (BURR) ×2 IMPLANT
BUR MATCHSTICK NEURO 3.0 LAGG (BURR) ×2 IMPLANT
CANISTER SUCT 3000ML PPV (MISCELLANEOUS) ×2 IMPLANT
CARTRIDGE OIL MAESTRO DRILL (MISCELLANEOUS) ×1 IMPLANT
COVER WAND RF STERILE (DRAPES) IMPLANT
DECANTER SPIKE VIAL GLASS SM (MISCELLANEOUS) ×2 IMPLANT
DERMABOND ADVANCED (GAUZE/BANDAGES/DRESSINGS) ×1
DERMABOND ADVANCED .7 DNX12 (GAUZE/BANDAGES/DRESSINGS) ×1 IMPLANT
DIFFUSER DRILL AIR PNEUMATIC (MISCELLANEOUS) ×2 IMPLANT
DRAPE HALF SHEET 40X57 (DRAPES) ×2 IMPLANT
DRAPE LAPAROTOMY 100X72 PEDS (DRAPES) ×2 IMPLANT
DRAPE MICROSCOPE LEICA (MISCELLANEOUS) ×2 IMPLANT
DURAPREP 6ML APPLICATOR 50/CS (WOUND CARE) ×2 IMPLANT
ELECT COATED BLADE 2.86 ST (ELECTRODE) ×2 IMPLANT
ELECT REM PT RETURN 9FT ADLT (ELECTROSURGICAL) ×2
ELECTRODE REM PT RTRN 9FT ADLT (ELECTROSURGICAL) ×1 IMPLANT
GAUZE 4X4 16PLY RFD (DISPOSABLE) IMPLANT
GLOVE BIOGEL PI IND STRL 7.5 (GLOVE) ×1 IMPLANT
GLOVE BIOGEL PI INDICATOR 7.5 (GLOVE) ×1
GLOVE ECLIPSE 6.5 STRL STRAW (GLOVE) ×2 IMPLANT
GLOVE EXAM NITRILE XL STR (GLOVE) IMPLANT
GLOVE SURG SS PI 7.0 STRL IVOR (GLOVE) ×4 IMPLANT
GOWN STRL REUS W/ TWL LRG LVL3 (GOWN DISPOSABLE) ×1 IMPLANT
GOWN STRL REUS W/ TWL XL LVL3 (GOWN DISPOSABLE) ×1 IMPLANT
GOWN STRL REUS W/TWL 2XL LVL3 (GOWN DISPOSABLE) IMPLANT
GOWN STRL REUS W/TWL LRG LVL3 (GOWN DISPOSABLE) ×2
GOWN STRL REUS W/TWL XL LVL3 (GOWN DISPOSABLE) ×2
IMPL ZERO-P 9MM LRD (Orthopedic Implant) ×1 IMPLANT
IMPLANT ZERO-P 9MM LRD (Orthopedic Implant) ×2 IMPLANT
KIT BASIN OR (CUSTOM PROCEDURE TRAY) ×2 IMPLANT
KIT TURNOVER KIT B (KITS) ×2 IMPLANT
NEEDLE HYPO 25X1 1.5 SAFETY (NEEDLE) ×2 IMPLANT
NEEDLE SPNL 22GX3.5 QUINCKE BK (NEEDLE) ×2 IMPLANT
NS IRRIG 1000ML POUR BTL (IV SOLUTION) ×2 IMPLANT
OIL CARTRIDGE MAESTRO DRILL (MISCELLANEOUS) ×2
PACK LAMINECTOMY NEURO (CUSTOM PROCEDURE TRAY) ×2 IMPLANT
PAD ARMBOARD 7.5X6 YLW CONV (MISCELLANEOUS) ×6 IMPLANT
PIN DISTRACTION 14MM (PIN) IMPLANT
RUBBERBAND STERILE (MISCELLANEOUS) ×4 IMPLANT
SCREW 12MM TI CERV LOCK (Screw) ×6 IMPLANT
SPONGE INTESTINAL PEANUT (DISPOSABLE) ×2 IMPLANT
SPONGE SURGIFOAM ABS GEL SZ50 (HEMOSTASIS) ×2 IMPLANT
SUT VIC AB 0 CT1 27 (SUTURE) ×2
SUT VIC AB 0 CT1 27XBRD ANTBC (SUTURE) ×1 IMPLANT
SUT VIC AB 3-0 SH 8-18 (SUTURE) ×2 IMPLANT
TOWEL GREEN STERILE (TOWEL DISPOSABLE) ×2 IMPLANT
TOWEL GREEN STERILE FF (TOWEL DISPOSABLE) ×2 IMPLANT
WATER STERILE IRR 1000ML POUR (IV SOLUTION) ×2 IMPLANT

## 2019-05-01 NOTE — Anesthesia Postprocedure Evaluation (Signed)
Anesthesia Post Note  Patient: Ashley Mercer  Procedure(s) Performed: Cervical Three-Four Anterior cervical decompression/discectomy/fusion (N/A Spine Cervical)     Patient location during evaluation: PACU Anesthesia Type: General Level of consciousness: awake Pain management: pain level controlled Vital Signs Assessment: post-procedure vital signs reviewed and stable Respiratory status: spontaneous breathing, nonlabored ventilation, respiratory function stable and patient connected to nasal cannula oxygen Cardiovascular status: blood pressure returned to baseline and stable Postop Assessment: no apparent nausea or vomiting Anesthetic complications: no    Last Vitals:  Vitals:   05/01/19 1800 05/01/19 1944  BP: (!) 142/70 (!) 143/89  Pulse: 68 83  Resp: 20 16  Temp: 36.4 C 36.4 C  SpO2: 94% 94%    Last Pain:  Vitals:   05/01/19 2037  TempSrc:   PainSc: 4                  Malijah Lietz P Ahliya Glatt

## 2019-05-01 NOTE — Op Note (Signed)
05/01/2019  2:39 PM  PATIENT:  Ashley Mercer  79 y.o. female  PRE-OPERATIVE DIAGNOSIS:  Cervical spondylosis with myelopathy  POST-OPERATIVE DIAGNOSIS:  Cervical Spondylosis with Myelopathy  PROCEDURE:  Anterior Cervical decompression C3/4 Arthrodesis C3/4 with Zero P 69mm Peek implant, vivigen allograft Anterior instrumentation(Synthes) C3/4  SURGEON:   Surgeon(s): Ashok Pall, MD   ASSISTANTS:none  ANESTHESIA:   general  EBL:  Total I/O In: 1000 [I.V.:1000] Out: 50 [Blood:50]  BLOOD ADMINISTERED:none  CELL SAVER GIVEN:none  COUNT:per nursing  DRAINS: none   SPECIMEN:  No Specimen  DICTATION: [name] was taken to the operating room, intubated, and placed under general anesthesia without difficulty. She was positioned supine with her head in slight extension on a horseshoe headrest. The neck was prepped and draped in a sterile manner. I infiltrated 4 cc's 1/2%lidocaine/1:200,000 strength epinephrine into the planned incision starting from the midline to the medial border of the left sternocleidomastoid muscle. I opened the incision with a 10 blade and dissected sharply through soft tissue to the platysma. I dissected in the plane superior to the platysma both rostrally and caudally. I then opened the platysma in a horizontal fashion with Metzenbaum scissors, and dissected in the inferior plane rostrally and caudally. With both blunt and sharp technique I created an avascular corridor to the cervical spine. I placed a spinal needle(s) in the disc space at 3/4 . I then reflected the longus colli from C3 to C4 and placed self retaining retractors. I opened the disc space(s) at 3/4 with a 15 blade. I removed disc with curettes, Kerrison punches, and the drill. Using the drill I removed osteophytes and prepared for the decompression.  I decompressed the spinal canal and the C4 root(s) with the drill, Kerrison punches, and the curettes. I used the microscope to aid in  microdissection. I removed the posterior longitudinal ligament to fully expose and decompress the thecal sac. I exposed the roots laterally taking down the 3/4 uncovertebral joints. With the decompression complete I moved on to the arthrodesis. I used the drill to level the surfaces of C3,4. I removed soft tissue to prepare the disc space and the bony surfaces. I measured the space and placed a 45mm structural allograft into the disc space.  i then placed the anterior instrumentation. I placed 2 screws in the C4 body through the plate, and one screw into C3. I locked the screws into place. Intraoperative xray showed the graft, plate, and screws to be in good position. I irrigated the wound, achieved hemostasis, and closed the wound in layers. I approximated the platysma, and the subcuticular plane with vicryl sutures. I used Dermabond for a sterile dressing.   PLAN OF CARE: Admit for overnight observation  PATIENT DISPOSITION:  PACU - hemodynamically stable.   Delay start of Pharmacological VTE agent (>24hrs) due to surgical blood loss or risk of bleeding:  yes

## 2019-05-01 NOTE — Progress Notes (Signed)
Pt refused 1900 and 2000 meds. She states that these are her "morning pills" and that she has already had them today. Pt agreeable to take them tomorrow AM.

## 2019-05-01 NOTE — Anesthesia Preprocedure Evaluation (Addendum)
Anesthesia Evaluation  Patient identified by MRN, date of birth, ID band Patient awake    Reviewed: Allergy & Precautions, NPO status , Patient's Chart, lab work & pertinent test results, reviewed documented beta blocker date and time   Airway Mallampati: III  TM Distance: >3 FB Neck ROM: Limited    Dental no notable dental hx.    Pulmonary former smoker,    Pulmonary exam normal breath sounds clear to auscultation       Cardiovascular hypertension, Pt. on medications and Pt. on home beta blockers Normal cardiovascular exam Rhythm:Regular Rate:Normal  ECG: SR, rate 62. Normal sinus rhythm Left bundle branch block   Neuro/Psych Anxiety    GI/Hepatic Neg liver ROS, GERD  Medicated,  Endo/Other  Hypothyroidism   Renal/GU negative Renal ROS     Musculoskeletal  (+) Arthritis , Chronic back pain   Abdominal (+) + obese,   Peds  Hematology negative hematology ROS (+)   Anesthesia Other Findings Cervical spondylosis with myelopathy  Reproductive/Obstetrics                            Anesthesia Physical Anesthesia Plan  ASA: III  Anesthesia Plan: General   Post-op Pain Management:    Induction: Intravenous  PONV Risk Score and Plan: 3 and Ondansetron, Dexamethasone and Treatment may vary due to age or medical condition  Airway Management Planned: Oral ETT and Video Laryngoscope Planned  Additional Equipment:   Intra-op Plan:   Post-operative Plan: Extubation in OR  Informed Consent: I have reviewed the patients History and Physical, chart, labs and discussed the procedure including the risks, benefits and alternatives for the proposed anesthesia with the patient or authorized representative who has indicated his/her understanding and acceptance.     Dental advisory given  Plan Discussed with: CRNA  Anesthesia Plan Comments:       Anesthesia Quick Evaluation

## 2019-05-01 NOTE — Anesthesia Procedure Notes (Signed)
Procedure Name: Intubation Date/Time: 05/01/2019 12:07 PM Performed by: Candis Shine, CRNA Pre-anesthesia Checklist: Patient identified, Emergency Drugs available, Suction available and Patient being monitored Patient Re-evaluated:Patient Re-evaluated prior to induction Oxygen Delivery Method: Circle System Utilized Preoxygenation: Pre-oxygenation with 100% oxygen Induction Type: IV induction Ventilation: Mask ventilation without difficulty Laryngoscope Size: Glidescope and 3 Grade View: Grade I Tube type: Oral Tube size: 7.0 mm Number of attempts: 1 Airway Equipment and Method: Rigid stylet and Video-laryngoscopy Placement Confirmation: ETT inserted through vocal cords under direct vision,  positive ETCO2 and breath sounds checked- equal and bilateral Secured at: 22 cm Tube secured with: Tape Dental Injury: Teeth and Oropharynx as per pre-operative assessment  Comments: Elective glidescope intubation d/t cervical myelopathy.

## 2019-05-01 NOTE — Transfer of Care (Signed)
Immediate Anesthesia Transfer of Care Note  Patient: Ashley Mercer  Procedure(s) Performed: Cervical Three-Four Anterior cervical decompression/discectomy/fusion (N/A Spine Cervical)  Patient Location: PACU  Anesthesia Type:General  Level of Consciousness: awake and alert   Airway & Oxygen Therapy: Patient Spontanous Breathing and Patient connected to face mask oxygen  Post-op Assessment: Report given to RN and Post -op Vital signs reviewed and stable  Post vital signs: Reviewed and stable  Last Vitals:  Vitals Value Taken Time  BP 116/78 05/01/19 1425  Temp    Pulse 75 05/01/19 1429  Resp 32 05/01/19 1429  SpO2 95 % 05/01/19 1429  Vitals shown include unvalidated device data.  Last Pain:  Vitals:   05/01/19 0959  TempSrc:   PainSc: 0-No pain         Complications: No apparent anesthesia complications

## 2019-05-01 NOTE — H&P (Signed)
BP (!) 171/71   Pulse (!) 59   Temp 98.4 F (36.9 C) (Oral)   Resp 18   Ht 5' 2.5" (1.588 m)   Wt 94.4 kg   SpO2 95%   BMI 37.47 kg/m   ALLERGIES :  She has no known drug allergies.     PAST SURGICAL HISTORY :  She has undergone a thyroidectomy, tonsillectomy, total abdominal hysterectomy with fibroid removal.     MEDICATIONS :  She takes Welchol, Cymbalta, Quinapril, Toprol, Synthroid, Lasix, Gemfibrozil, and an over-the-counter medication.     PHYSICAL EXAMINATION :  VitaMrs.  Tolefree returns today.  I did a lumbar laminectomy for decompression at L5-S1 approximately a year ago.  I had seen her last in July of 2019.  She returns today stating she can barely walk, having significant problems with the right lower extremity.  She also is having difficulty holding objects in her hand.  She has cervical stenosis, but that is known at C3-4, which is above the level of her arthrodesis.  She also supposedly has carpal tunnel that her neurologist, Dr. Jaynee Eagles, is due to have her undergo an EMG and nerve conduction study.  She had a mildly positive Tinel sign.  She is hyperreflexic, but I think that stems from her old stenosis, not anything else going on now.  There has not been a significant amount of progression at all at C3-4 over the last year.  I am very curious as to what the EMG will show because if she does have carpal tunnel, I would much prefer to simply decompress the wrist first and see what she gets as opposed to going in, yet again, on the cervical spine.  She is 79 years of age.   She walks in a markedly stooped fashion using a walker.  She was well decompressed in the lumbar region a year ago, but I am not sure what things look like now.  Memory, language, attention span, and fund of knowledge are normal.  She has full strength in the upper extremities, that includes her grip, so I am curious as to what is cervical spine and what might be carpal tunnel.  General: She is alert,  oriented by 4, answers all questions appropriately. Neurologic: Memory, language, attention span, and fund of knowledge normal. She is in obvious distress, having to get up in a very stooped position. Her daughter helps her with walking. She has normal strength in the lower extremities but has extremely bad posture. She does have reflexes at the knees and ankles. Pupils equal, round, and reactive to light. Full extraocular movements. Full visual fields.     She states that she has a significant amount of back pain across the lower back, not so much into the lower extremities. She has had this pain now for a year and a half. She took a fall and just did not think about calling us about her pain. She has had x-rays. I will obtain an MRI of the lumbar spine. Hopefully, this will give me some more information. I will also obtain some plain x-rays of the lower back.     REVIEW OF SYSTEMS :  Positive for hypercholesterolemia, swelling in the feet and hands, skin disease, joint pain, back pain, anxiety. Says she has been gaining weight secondary to lack of exercise. Says she has been getting stronger riding a bike. She has tingling in the hands after the cervical surgery. Weakness in the legs, but that is getting stronger. She  cannot stand for longer than 5 to 10 minutes before she is in a great deal of pain. Rated her pain today 4/5.     FAMILY HISTORY :  Mother died age 52. Father died at age 68. Kidney disease, heart disease. Social History   Socioeconomic History  . Marital status: Married    Spouse name: [name]  . Number of children: 5  . Years of education: College  . Highest education level: Not on file  Occupational History  . Occupation: Retired    Comment: Therapist, sports  Tobacco Use  . Smoking status: Former Smoker    Packs/day: 0.25    Years: 10.00    Pack years: 2.50    Types: Cigarettes    Quit date: 03/03/1988    Years since quitting: 31.1  . Smokeless tobacco: Never Used  Substance  and Sexual Activity  . Alcohol use: Yes    Comment: quit in 1997; wine occasionally  . Drug use: No  . Sexual activity: Not on file  Other Topics Concern  . Not on file  Social History Narrative   Patient lives at home with her spouse.   Caffeine Use: 2 cups daily   Right handed   Social Determinants of Health   Financial Resource Strain:   . Difficulty of Paying Living Expenses:   Food Insecurity:   . Worried About Charity fundraiser in the Last Year:   . Arboriculturist in the Last Year:   Transportation Needs:   . Film/video editor (Medical):   Marland Kitchen Lack of Transportation (Non-Medical):   Physical Activity:   . Days of Exercise per Week:   . Minutes of Exercise per Session:   Stress:   . Feeling of Stress :   Social Connections:   . Frequency of Communication with Friends and Family:   . Frequency of Social Gatherings with Friends and Family:   . Attends Religious Services:   . Active Member of Clubs or Organizations:   . Attends Archivist Meetings:   Marland Kitchen Marital Status:   Intimate Partner Violence:   . Fear of Current or Ex-Partner:   . Emotionally Abused:   Marland Kitchen Physically Abused:   . Sexually Abused:    Past Surgical History:  Procedure Laterality Date  . APPENDECTOMY    . BREAST BIOPSY Left 2006   benign  . BREAST EXCISIONAL BIOPSY Left 2006   papilloma  . CERVICAL SPINE SURGERY  01/28/2009  . COLONOSCOPY W/ POLYPECTOMY    . EYE SURGERY Bilateral 01/2017   cataract surgery  . JOINT REPLACEMENT     Right knee  . LUMBAR LAMINECTOMY/DECOMPRESSION MICRODISCECTOMY N/A 05/10/2017   Procedure: LAMINECTOMY LUMBAR 5- SACRAL 1;  Surgeon: Ashok Pall, MD;  Location: [facility];  Service: Neurosurgery;  Laterality: N/A;  LAMINECTOMY LUMBAR 5- SACRAL 1  . LUMBAR SPINE SURGERY  2018  . THYROIDECTOMY  1971  . TONSILLECTOMY    . TOTAL ABDOMINAL HYSTERECTOMY  1986  . TOTAL KNEE ARTHROPLASTY Right 03/09/2019   Procedure: TOTAL KNEE ARTHROPLASTY;  Surgeon: Gaynelle Arabian, MD;  Location: [facility];  Service: Orthopedics;  Laterality: Right;  60min   Past Medical History:  Diagnosis Date  . Anemia    during pregnancy  . Anxiety   . Arthritis   . Cervical spinal stenosis   . Chronic back pain   . Dry skin   . Eczema   . Fatty tumor    Right wrist  . Frequent UTI   .  GERD (gastroesophageal reflux disease)   . High cholesterol   . History of blood in urine    Pt was seen by [facility]; scant amount; no current issues  . Hypertension   . Hypothyroidism   . Lumbar spinal stenosis   . Numbness and tingling in hands    Bilateral  . Pneumonia   . Proteinuria 2020   seeing a kidney specialist  . Thyroid condition          Mrs. Zingale returns today.  It has been since November when we were trying to get all this done, but she said COVID put a halt on many things.  She has had the EMG, which does not show carpal tunnel.  Therefore, we will proceed with an anterior cervical decompression and arthrodesis at the level superior to her arthrodesis that starts at 4-5.  We will do C3-4 and I will use a Zero-P device because I have absolutely no intention of trying to remove the entire plate.  She is well-fused.  She is hyperreflexic in the upper extremities.  She is very weak in her hands.  Grip is about 4/5, as are the intrinsics.  She has Hoffmann sign.  There is no clonus that I was able to appreciate.  Mrs. Hodgkin is well-versed in what goes on during an anterior cervical decompression and arthrodesis, and she is in agreement.  She really wants to regain use of her hands.  This honestly is more of a measure to stop deterioration as it is to enhance further function, but almost always you do get some improved function after decompression.  She is markedly stenotic there and I think she will do better.  I did tell her since it is higher at C3-4, she will still have that feeling of a large frog in her throat.

## 2019-05-01 NOTE — Progress Notes (Signed)
Pt. Came out of OR on simple mask with O2 saturation in 80s. CRNA stayed at bedside until O2 went up into 90s and called Dr. Roanna Banning.  Pt. Was able to transition to a simple mask at 8L and then started having wheezing and was unable to take a breath at around 1440. Lungs were clear at the top and diminished in the bases.  Wheezing was inspiratory and was coming from lower neck area. Dr. Cristopher Estimable paged and came to bedside, Luepke was able to take a breath by the time Dr. Cristopher Estimable arrived.  Stats did not register during episode.  Pt was transitioned up to a NRB at [place].  Pt. Began to breath better and was able to transition back down to a [place] at 6L. Around 1530 the same thing happened again and Dr. Cristopher Estimable was paged and came to bedside, O2 sats dropped into the 45s and pt. Couldn't breath. She began to breath again by the time Dr. Cristopher Estimable got to bedside. We gave an albuterol treatment and pt. Seemed better. Pt. Has been alert and oriented to self and place throughout but confused about situation and at some points refusing to wear oxygen mask.  Pt. Is now on 4L with O2 saturation in the low 90s. Pt. Is not having pain and is going to go to a progressive floor where she can be monitored more closely. Dr. Christella Noa aware of situation. Surgical site is soft with some ecchymosis but no changes.

## 2019-05-02 DIAGNOSIS — Z79899 Other long term (current) drug therapy: Secondary | ICD-10-CM | POA: Diagnosis not present

## 2019-05-02 DIAGNOSIS — E7800 Pure hypercholesterolemia, unspecified: Secondary | ICD-10-CM | POA: Diagnosis not present

## 2019-05-02 DIAGNOSIS — F419 Anxiety disorder, unspecified: Secondary | ICD-10-CM | POA: Diagnosis not present

## 2019-05-02 DIAGNOSIS — Z79890 Hormone replacement therapy (postmenopausal): Secondary | ICD-10-CM | POA: Diagnosis not present

## 2019-05-02 DIAGNOSIS — Z87891 Personal history of nicotine dependence: Secondary | ICD-10-CM | POA: Diagnosis not present

## 2019-05-02 DIAGNOSIS — M4712 Other spondylosis with myelopathy, cervical region: Secondary | ICD-10-CM | POA: Diagnosis not present

## 2019-05-02 MED ORDER — OXYCODONE-ACETAMINOPHEN 7.5-325 MG PO TABS: 1.0000 | ORAL_TABLET | ORAL | 0 refills | Status: DC | PRN

## 2019-05-02 MED ORDER — ASPIRIN EC 81 MG PO TBEC: 81.0000 mg | DELAYED_RELEASE_TABLET | Freq: Every day | ORAL | Status: DC

## 2019-05-02 NOTE — Progress Notes (Signed)
Received from PACU in bed.  Oriented to room and surroundings.  All questions answered and education provided.

## 2019-05-02 NOTE — Progress Notes (Signed)
Pt mobilized OOB and ambulated around the room. 1 assist with front wheel walker, pt tolerated well.

## 2019-05-02 NOTE — Progress Notes (Signed)
Occupational Therapy Evaluation Patient Details Name: Ashley Mercer MRN: [id number] DOB: 12/09/1940 Today's Date: 05/02/2019    History of Present Illness 79 yo s/p c3-4 ACDF. s/p R TKA. PMH: carpal tunnel,Lumbar stenosis with neurogenic claudication, Spondylolisthesis of lumbar region, HTN   Clinical Impression   Completed education regarding cervical precautions and ADL and functional mobility for ADL. Written information reviewed with pt and daughter. Educated on grip/pinch strengthening and  fine motor/coordination activities. Recommend pt follow up with outpt OT once cleared by Dr Christella Noa.     Follow Up Recommendations  Outpatient OT;Supervision - Intermittent    Equipment Recommendations  None recommended by OT    Recommendations for Other Services       Precautions / Restrictions Precautions Precautions: Cervical Precaution Booklet Issued: Yes (comment) Restrictions Weight Bearing Restrictions: No      Mobility Bed Mobility Overal bed mobility: Needs Assistance Bed Mobility: Rolling;Sidelying to Sit Rolling: Supervision Sidelying to sit: Min guard       General bed mobility comments: vc for correct technique  Transfers Overall transfer level: Needs assistance Equipment used: Rolling walker (2 wheeled) Transfers: Sit to/from Stand Sit to Stand: Min guard         General transfer comment: educated on proper technique; pt able to return demonstrate    Balance Overall balance assessment: Needs assistance Sitting-balance support: Feet supported Sitting balance-Leahy Scale: Good       Standing balance-Leahy Scale: Poor Standing balance comment: reliant on external support; recent TKA; planning on surgery for L knee in a few weeks                           ADL either performed or assessed with clinical judgement   ADL Overall ADL's : Needs assistance/impaired Eating/Feeding: Set up   Grooming: Sitting;Set up;Supervision/safety   Upper  Body Bathing: Minimal assistance;Sitting   Lower Body Bathing: Moderate assistance;Sit to/from stand   Upper Body Dressing : Minimal assistance;Sitting   Lower Body Dressing: Moderate assistance;Sit to/from stand   Toilet Transfer: Minimal assistance;Cueing for sequencing;Ambulation;RW   Toileting- Clothing Manipulation and Hygiene: Minimal assistance Toileting - Clothing Manipulation Details (indicate cue type and reason): Educated on use of AE; pt states she has a Education administrator mobility during ADLs: Minimal assistance;Rolling walker;Cueing for safety General ADL Comments: Educated pt on availability of AE to increase her independence with ADL, pt states her children will continue to assist her; recommend she use a shower chair for bathing and have assistance getting in/out of shower Pt verbalized understanding.     Vision Baseline Vision/History: Wears glasses       Perception     Praxis      Pertinent Vitals/Pain Pain Assessment: 0-10 Pain Score: 4  Pain Location: veck/throat Pain Descriptors / Indicators: Aching Pain Intervention(s): Limited activity within patient's tolerance     Hand Dominance Right   Extremity/Trunk Assessment Upper Extremity Assessment Upper Extremity Assessment: Generalized weakness;RUE deficits/detail;LUE deficits/detail RUE Deficits / Details: shoulder FF to 90; other ROM WFL LUE Deficits / Details: generalized weakness; apparent neruoapraxia but able to use functionally   Lower Extremity Assessment Lower Extremity Assessment: Defer to PT evaluation   Cervical / Trunk Assessment Cervical / Trunk Assessment: Other exceptions(hx of spinal surgeries)   Communication Communication Communication: No difficulties   Cognition Arousal/Alertness: Awake/alert Behavior During Therapy: WFL for tasks assessed/performed Overall Cognitive Status: Within Functional Limits for tasks assessed  General Comments  R TKA 5 weeks ago    Exercises Exercises: Other exercises Other Exercises Other Exercises: fine motor coordination activities Other Exercises: grip and pinch strengthening - pt has theraputty at home Other Exercises: eye hand coordination activities Other Exercises: wall climbs to 90 shoulder FF/ abd   Shoulder Instructions      Home Living Family/patient expects to be discharged to:: Private residence Living Arrangements: Spouse/significant other Available Help at Discharge: Family;Available 24 hours/day Type of Home: House Home Access: Stairs to enter [employer] of Steps: 1 Entrance Stairs-Rails: Right Home Layout: Two level;Able to live on main level with bedroom/bathroom     Bathroom Shower/Tub: Occupational psychologist: Standard Bathroom Accessibility: Yes How Accessible: Accessible via walker Home Equipment: Harvey - 2 wheels;Cane - single point;Bedside commode;Shower seat   Additional Comments: Information is on daughter's house      Prior Functioning/Environment Level of Independence: Needs assistance  Gait / Transfers Assistance Needed: using a RW for gait ADL's / Homemaking Assistance Needed: children helped with ADL tasks becuase she was unable to use her hands since fall            OT Problem List: Decreased strength;Decreased range of motion;Impaired balance (sitting and/or standing);Decreased coordination;Decreased knowledge of use of DME or AE;Impaired sensation;Obesity;Impaired tone;Impaired UE functional use;Pain      OT Treatment/Interventions:      OT Goals(Current goals can be found in the care plan section) Acute Rehab OT Goals Patient Stated Goal: to be ablet o use her hands again OT Goal Formulation: All assessment and education complete, DC therapy  OT Frequency:     Barriers to D/C:            Co-evaluation              AM-PAC OT "6 Clicks" Daily Activity     Outcome Measure Help from  another person eating meals?: A Little Help from another person taking care of personal grooming?: A Little Help from another person toileting, which includes using toliet, bedpan, or urinal?: A Little Help from another person bathing (including washing, rinsing, drying)?: A Lot Help from another person to put on and taking off regular upper body clothing?: A Little Help from another person to put on and taking off regular lower body clothing?: A Lot 6 Click Score: 16   End of Session Equipment Utilized During Treatment: Rolling walker Nurse Communication: Mobility status;Other (comment)(DC needs)  Activity Tolerance: Patient tolerated treatment well Patient left: in bed;with call bell/phone within reach;with family/visitor present  OT Visit Diagnosis: Unsteadiness on feet (R26.81);Muscle weakness (generalized) (M62.81);Pain Pain - part of body: (neck/throat)                Time: 0926-1000 OT Time Calculation (min): 34 min Charges:  OT General Charges $OT Visit: 1 Visit OT Evaluation $OT Eval Low Complexity: 1 Low OT Treatments $Self Care/Home Management : 8-22 mins  Maurie Boettcher, OT/L   Acute OT Clinical Specialist Acute Rehabilitation Services Pager [phone] Office [phone]   [facility] 05/02/2019, 10:17 AM

## 2019-05-02 NOTE — Progress Notes (Signed)
  NEUROSURGERY PROGRESS NOTE   No issues overnight.  Continues to wear nasal cannula, but doesn't feel as though she needs it. Attributes difficulties to taking first time ever flexeril prior to surgery. No SOB, chest pain/tightness ambulation well. Tolerating po. Voiding normal Ready for d/c   EXAM:  BP 138/65 (BP Location: Right Arm)   Pulse 64   Temp 97.7 F (36.5 C) (Oral)   Resp 13   Ht 5' 2.5" (1.588 m)   Wt 94.4 kg   SpO2 95%   BMI 37.47 kg/m   Awake, alert, oriented  Speech fluent, appropriate  CN grossly intact  MAEMW, stable motor Incision c/d/i  IMPRESSION/PLAN 79 y.o. female POD1. Doing well.  - Removed nasal cannula. Will monitor O2 sats. If they are stable, can d/c home. Discussed with nursing

## 2019-05-02 NOTE — Discharge Summary (Signed)
Physician Discharge Summary  Patient ID: Ashley Mercer MRN: [id number] DOB/AGE: [birth year] 79 y.o.  Admit date: 05/01/2019 Discharge date: 05/02/2019  Admission Diagnoses:  Cervical HNP Adverse reaction to anesthesia, hypoxia  Discharge Diagnoses:  Same Active Problems:   HNP (herniated nucleus pulposus) with myelopathy, cervical   Discharged Condition: Stable  Hospital Course:  Ashley Mercer is a 79 y.o. female who was admitted for the below procedure. Post op immediately complicated by hypoxia requiring O2 supplementation . Patient attributes difficulties due totaking flexeril for the first time, just pror to surgery. Nasal cannula removed in the am. O2 sats remained >95%. No SOB, chest pain/tightness. Ready for d.c. At time of discharge, pain was well controlled, ambulating with Pt/OT, tolerating po, voiding normal. Ready for discharge.  Treatments: Surgery Anterior Cervical decompression C3/4 Arthrodesis C3/4 with Zero P 76mm Peek implant, vivigen allograft Anterior instrumentation(Synthes) C3/4  Discharge Exam: Blood pressure 138/65, pulse 64, temperature 97.7 F (36.5 C), temperature source Oral, resp. rate 13, height 5' 2.5" (1.588 m), weight 94.4 kg, SpO2 95 %. Awake, alert, oriented Speech fluent, appropriate CN grossly intact MAEW Wound c/d/i  Disposition: Discharge disposition: 01-Home or Self Care       Discharge Instructions    Call MD for:  difficulty breathing, headache or visual disturbances   Complete by: As directed    Call MD for:  persistant dizziness or light-headedness   Complete by: As directed    Call MD for:  redness, tenderness, or signs of infection (pain, swelling, redness, odor or green/yellow discharge around incision site)   Complete by: As directed    Call MD for:  severe uncontrolled pain   Complete by: As directed    Call MD for:  temperature >100.4   Complete by: As directed    Diet general   Complete by: As directed    Driving  Restrictions   Complete by: As directed    Do not drive until given clearance.   Increase activity slowly   Complete by: As directed    Lifting restrictions   Complete by: As directed    Do not lift anything >10lbs. Avoid bending and twisting in awkward positions. Avoid bending at the back.   May shower / Bathe   Complete by: As directed    In 24 hours. Okay to wash wound with warm soapy water. Avoid scrubbing the wound. Pat dry.   Remove dressing in 24 hours   Complete by: As directed      Allergies as of 05/02/2019   No Known Allergies     Medication List    STOP taking these medications   oxyCODONE 5 MG immediate release tablet Commonly known as: Oxy IR/ROXICODONE     TAKE these medications   acetaminophen 500 MG tablet Commonly known as: TYLENOL Take 1,000 mg by mouth every 6 (six) hours as needed (back pain.).   amLODipine 5 MG tablet Commonly known as: NORVASC Take 5 mg by mouth daily.   aspirin EC 81 MG tablet Take 1 tablet (81 mg total) by mouth daily. Start taking on: May 08, 2019 What changed: These instructions start on May 08, 2019. If you are unsure what to do until then, ask your doctor or other care provider.   clobetasol ointment 0.05 % Commonly known as: TEMOVATE Apply 1 application topically 2 (two) times daily as needed (eczema).   colesevelam 625 MG tablet Commonly known as: WELCHOL Take 1,250 mg by mouth 2 (two) times daily with a  meal.   cyclobenzaprine 10 MG tablet Commonly known as: FLEXERIL Take 1 tablet (10 mg total) by mouth 3 (three) times daily as needed for muscle spasms. What changed: how much to take   docusate sodium 100 MG capsule Commonly known as: COLACE Take 100 mg by mouth daily.   DULoxetine 60 MG capsule Commonly known as: CYMBALTA Take 60 mg by mouth at bedtime.   famotidine 40 MG tablet Commonly known as: PEPCID Take 40 mg by mouth at bedtime.   furosemide 20 MG tablet Commonly known as: LASIX Take 20 mg  by mouth daily.   ibuprofen 800 MG tablet Commonly known as: ADVIL Take 800 mg by mouth in the morning and at bedtime.   levothyroxine 100 MCG tablet Commonly known as: SYNTHROID Take 100 mcg by mouth daily before breakfast.   Lopid 600 MG tablet Generic drug: gemfibrozil Take 600 mg by mouth 2 (two) times daily.   metoprolol succinate 100 MG 24 hr tablet Commonly known as: TOPROL-XL Take 100 mg by mouth daily.   multivitamin with minerals Tabs tablet Take 1 tablet by mouth daily.   oxyCODONE-acetaminophen 7.5-325 MG tablet Commonly known as: Percocet Take 1 tablet by mouth every 4 (four) hours as needed for severe pain.   pregabalin 75 MG capsule Commonly known as: LYRICA Take 75 mg by mouth 2 (two) times daily.   quinapril-hydrochlorothiazide 20-25 MG tablet Commonly known as: ACCURETIC Take 1 tablet by mouth daily.   raloxifene 60 MG tablet Commonly known as: EVISTA Take 60 mg by mouth daily.      Follow-up Information    Ashok Pall, MD Follow up.   Specialty: Neurosurgery Contact information: [address] [place] [address] [phone]           Signed: Traci Sermon 05/02/2019, 9:09 AM

## 2019-05-02 NOTE — Progress Notes (Signed)
Discharged to home after IV access removed and discharge instructions provided.  Script for percocet sent with.  Daughter at her side and educated as well.

## 2019-05-02 NOTE — Evaluation (Signed)
Physical Therapy Evaluation Patient Details Name: Ashley Mercer MRN: [id number] DOB: 09-12-1940 Today's Date: 05/02/2019   History of Present Illness  79 yo s/p c3-4 ACDF. s/p R TKA. PMH: carpal tunnel,Lumbar stenosis with neurogenic claudication, Spondylolisthesis of lumbar region, HTN  Clinical Impression  Pt in bed upon arrival of PT, agreeable to evaluation at this time. Prior to admission the pt was reliant on family for assist with ADLs and mobility due to decreased strength and function in RUE. The pt plans to d./c home with her daughter who has 1 step to enter and can provide 24/7 supervision. The pt now presents with limitations in functional mobility and endurance due to above dx and limited mobility prior to surgery, and will continue to benefit from skilled PT in the setting of [place] to address these deficits. The pt was able to demo good working understanding of cervical precautions and bed mobility as well as good stability with transfers and hallway ambulation. The pt was also able to navigate 1 step multiple times with use of railings, and both the pt and her daughter verbalized good understanding of the stair training. The pt will do well with HHPT to improve her functional stability and endurance.      Follow Up Recommendations Home health PT;Supervision for mobility/OOB    Equipment Recommendations  (pt has needed equipment)    Recommendations for Other Services       Precautions / Restrictions Precautions Precautions: Cervical Precaution Booklet Issued: Yes (comment) Restrictions Weight Bearing Restrictions: No      Mobility  Bed Mobility Overal bed mobility: Modified Independent             General bed mobility comments: pt able to verbalize correct technique, sitting EOB  Transfers Overall transfer level: Needs assistance Equipment used: Rolling walker (2 wheeled) Transfers: Sit to/from Stand           General transfer comment: educated on proper  technique; pt able to return demonstrate  Ambulation/Gait Ambulation/Gait assistance: Min guard Gait Distance (Feet): 60 Feet(60 ft x2) Assistive device: Rolling walker (2 wheeled) Gait Pattern/deviations: Step-through pattern;Ataxic   Gait velocity interpretation: <1.31 ft/sec, indicative of household ambulator General Gait Details: pt with slow but steady gait with slightly ataxic steps, able to demo heel-to pattern without cues. pt fatigues rapidly, desat to 85% on RA, recovered to 94% with 30 sec seated rest  Stairs Stairs: Yes Stairs assistance: Min guard Stair Management: Two rails;Step to pattern;Forwards Number of Stairs: 1(1 step x5) General stair comments: pt does best with L leg leading when ascending. able to practice bilateral stepping for strengthening exercise.  Wheelchair Mobility    Modified Rankin (Stroke Patients Only)       Balance Overall balance assessment: Needs assistance Sitting-balance support: Feet supported Sitting balance-Leahy Scale: Good Sitting balance - Comments: supervision   Standing balance support: Bilateral upper extremity supported;During functional activity Standing balance-Leahy Scale: Fair Standing balance comment: pt able to static stand without UE support, reliant on BUE support for ambulation                             Pertinent Vitals/Pain Pain Assessment: No/denies pain Pain Intervention(s): Limited activity within patient's tolerance;Monitored during session    [place] expects to be discharged to:: Private residence Living Arrangements: Spouse/significant other Available Help at Discharge: Family;Available 24 hours/day Type of Home: House Home Access: Stairs to enter Entrance Stairs-Rails: Right Entrance Stairs-Number of Steps:  1 Home Layout: Two level;Able to live on main level with bedroom/bathroom Home Equipment: Gilford Rile - 2 wheels;Cane - single point;Bedside commode;Shower  seat Additional Comments: Information is on daughter's house    Prior Function Level of Independence: Needs assistance   Gait / Transfers Assistance Needed: using a RW for gait, but not much ambulation reccently due to poor R grip strength  ADL's / Homemaking Assistance Needed: children helped with ADL tasks becuase she was unable to use her hands since fall        Hand Dominance   Dominant Hand: Right    Extremity/Trunk Assessment   Upper Extremity Assessment Upper Extremity Assessment: Defer to OT evaluation    Lower Extremity Assessment Lower Extremity Assessment: Generalized weakness;RLE deficits/detail;LLE deficits/detail RLE Deficits / Details: lacking full extension by ~10-15 deg, good strength with MMT, quickly fatigued with functional mobiltiy R > L LLE Deficits / Details: good strength with MMT, quickly fatigued with functional mobiltiy R > L    Cervical / Trunk Assessment Cervical / Trunk Assessment: Other exceptions Cervical / Trunk Exceptions: hx of spinal surgeries  Communication   Communication: No difficulties  Cognition Arousal/Alertness: Awake/alert Behavior During Therapy: WFL for tasks assessed/performed Overall Cognitive Status: Within Functional Limits for tasks assessed                                        General Comments General comments (skin integrity, edema, etc.): R TKA 5 weeks ago, desat to 85% with RA while ambulating, increased to 95% in 30 sec seated rest    Exercises     Assessment/Plan    PT Assessment All further PT needs can be met in the next venue of care  PT Problem List Decreased strength;Decreased mobility;Decreased range of motion;Decreased coordination;Decreased balance;Cardiopulmonary status limiting activity;Decreased activity tolerance       PT Treatment Interventions DME instruction;Therapeutic exercise;Gait training;Balance training;Stair training;Functional mobility training;Therapeutic  activities;Patient/family education    PT Goals (Current goals can be found in the Care Plan section)  Acute Rehab PT Goals Patient Stated Goal: to be ablet o use her hands again PT Goal Formulation: With patient Time For Goal Achievement: 05/16/19 Potential to Achieve Goals: Good    Frequency     Barriers to discharge        Co-evaluation               AM-PAC PT "6 Clicks" Mobility  Outcome Measure Help needed turning from your back to your side while in a flat bed without using bedrails?: A Little Help needed moving from lying on your back to sitting on the side of a flat bed without using bedrails?: A Little Help needed moving to and from a bed to a chair (including a wheelchair)?: A Little Help needed standing up from a chair using your arms (e.g., wheelchair or bedside chair)?: A Little Help needed to walk in hospital room?: A Little Help needed climbing 3-5 steps with a railing? : A Little 6 Click Score: 18    End of Session Equipment Utilized During Treatment: Gait belt Activity Tolerance: Patient tolerated treatment well;Patient limited by fatigue Patient left: in bed;with call bell/phone within reach;with family/visitor present(sitting EOB) Nurse Communication: Mobility status PT Visit Diagnosis: Muscle weakness (generalized) (M62.81);Difficulty in walking, not elsewhere classified (R26.2);Ataxic gait (R26.0)    Time: 2409-7353 PT Time Calculation (min) (ACUTE ONLY): 27 min   Charges:  PT Evaluation $PT Eval Moderate Complexity: 1 Mod PT Treatments $Gait Training: 8-22 mins        Karma Ganja, PT, DPT   Acute Rehabilitation Department Pager #: [phone]  Otho Bellows 05/02/2019, 1:51 PM

## 2019-05-04 ENCOUNTER — Encounter: Payer: Self-pay | Admitting: *Deleted

## 2019-05-06 ENCOUNTER — Ambulatory Visit: Payer: Medicare Other | Attending: Internal Medicine

## 2019-05-06 DIAGNOSIS — Z23 Encounter for immunization: Secondary | ICD-10-CM

## 2019-05-06 NOTE — Progress Notes (Signed)
   Covid-19 Vaccination Clinic  Name:  Ashley Mercer    MRN: [id number] DOB: Dec 07, 1940  05/06/2019  Ms. Ashley Mercer was observed post Covid-19 immunization for 15 minutes without incident. She was provided with Vaccine Information Sheet and instruction to access the V-Safe system.   Ms. Ashley Mercer was instructed to call 911 with any severe reactions post vaccine: Marland Kitchen Difficulty breathing  . Swelling of face and throat  . A fast heartbeat  . A bad rash all over body  . Dizziness and weakness   Immunizations Administered    Name Date Dose VIS Date Route   Pfizer COVID-19 Vaccine 05/06/2019 10:55 AM 0.3 mL 03/11/2018 Intramuscular   Manufacturer: [place]   Lot: [id number]   [place]: [id number]

## 2019-05-07 DIAGNOSIS — M5000 Cervical disc disorder with myelopathy, unspecified cervical region: Secondary | ICD-10-CM | POA: Diagnosis not present

## 2019-05-19 DIAGNOSIS — I129 Hypertensive chronic kidney disease with stage 1 through stage 4 chronic kidney disease, or unspecified chronic kidney disease: Secondary | ICD-10-CM | POA: Diagnosis not present

## 2019-05-19 DIAGNOSIS — E669 Obesity, unspecified: Secondary | ICD-10-CM | POA: Diagnosis not present

## 2019-05-19 DIAGNOSIS — R809 Proteinuria, unspecified: Secondary | ICD-10-CM | POA: Diagnosis not present

## 2019-06-01 DIAGNOSIS — M4712 Other spondylosis with myelopathy, cervical region: Secondary | ICD-10-CM | POA: Diagnosis not present

## 2019-06-09 DIAGNOSIS — I129 Hypertensive chronic kidney disease with stage 1 through stage 4 chronic kidney disease, or unspecified chronic kidney disease: Secondary | ICD-10-CM | POA: Diagnosis not present

## 2019-07-07 DIAGNOSIS — Z0000 Encounter for general adult medical examination without abnormal findings: Secondary | ICD-10-CM | POA: Diagnosis not present

## 2019-07-07 DIAGNOSIS — I10 Essential (primary) hypertension: Secondary | ICD-10-CM | POA: Diagnosis not present

## 2019-07-07 DIAGNOSIS — L309 Dermatitis, unspecified: Secondary | ICD-10-CM | POA: Diagnosis not present

## 2019-07-07 DIAGNOSIS — E039 Hypothyroidism, unspecified: Secondary | ICD-10-CM | POA: Diagnosis not present

## 2019-07-07 DIAGNOSIS — K219 Gastro-esophageal reflux disease without esophagitis: Secondary | ICD-10-CM | POA: Diagnosis not present

## 2019-07-07 DIAGNOSIS — R809 Proteinuria, unspecified: Secondary | ICD-10-CM | POA: Diagnosis not present

## 2019-07-07 DIAGNOSIS — M545 Low back pain: Secondary | ICD-10-CM | POA: Diagnosis not present

## 2019-07-07 DIAGNOSIS — R202 Paresthesia of skin: Secondary | ICD-10-CM | POA: Diagnosis not present

## 2019-07-07 DIAGNOSIS — E782 Mixed hyperlipidemia: Secondary | ICD-10-CM | POA: Diagnosis not present

## 2019-07-07 DIAGNOSIS — R7309 Other abnormal glucose: Secondary | ICD-10-CM | POA: Diagnosis not present

## 2019-07-07 DIAGNOSIS — M8588 Other specified disorders of bone density and structure, other site: Secondary | ICD-10-CM | POA: Diagnosis not present

## 2019-07-07 DIAGNOSIS — Z1389 Encounter for screening for other disorder: Secondary | ICD-10-CM | POA: Diagnosis not present

## 2019-07-08 ENCOUNTER — Other Ambulatory Visit: Payer: Self-pay | Admitting: Family Medicine

## 2019-07-08 DIAGNOSIS — E2839 Other primary ovarian failure: Secondary | ICD-10-CM

## 2019-08-04 DIAGNOSIS — I10 Essential (primary) hypertension: Secondary | ICD-10-CM | POA: Diagnosis not present

## 2019-08-04 DIAGNOSIS — E039 Hypothyroidism, unspecified: Secondary | ICD-10-CM | POA: Diagnosis not present

## 2019-08-04 DIAGNOSIS — E7800 Pure hypercholesterolemia, unspecified: Secondary | ICD-10-CM | POA: Diagnosis not present

## 2019-08-04 DIAGNOSIS — E782 Mixed hyperlipidemia: Secondary | ICD-10-CM | POA: Diagnosis not present

## 2019-09-30 DIAGNOSIS — M1712 Unilateral primary osteoarthritis, left knee: Secondary | ICD-10-CM | POA: Diagnosis not present

## 2019-10-07 DIAGNOSIS — M25562 Pain in left knee: Secondary | ICD-10-CM | POA: Diagnosis not present

## 2019-10-07 DIAGNOSIS — M1712 Unilateral primary osteoarthritis, left knee: Secondary | ICD-10-CM | POA: Diagnosis not present

## 2019-10-14 DIAGNOSIS — M25562 Pain in left knee: Secondary | ICD-10-CM | POA: Diagnosis not present

## 2019-10-14 DIAGNOSIS — M1712 Unilateral primary osteoarthritis, left knee: Secondary | ICD-10-CM | POA: Diagnosis not present

## 2019-10-21 ENCOUNTER — Other Ambulatory Visit: Payer: Medicare Other

## 2019-11-05 DIAGNOSIS — K219 Gastro-esophageal reflux disease without esophagitis: Secondary | ICD-10-CM | POA: Diagnosis not present

## 2019-11-05 DIAGNOSIS — E7800 Pure hypercholesterolemia, unspecified: Secondary | ICD-10-CM | POA: Diagnosis not present

## 2019-11-05 DIAGNOSIS — E039 Hypothyroidism, unspecified: Secondary | ICD-10-CM | POA: Diagnosis not present

## 2019-11-05 DIAGNOSIS — I10 Essential (primary) hypertension: Secondary | ICD-10-CM | POA: Diagnosis not present

## 2019-11-05 DIAGNOSIS — E782 Mixed hyperlipidemia: Secondary | ICD-10-CM | POA: Diagnosis not present

## 2019-11-27 DIAGNOSIS — M1712 Unilateral primary osteoarthritis, left knee: Secondary | ICD-10-CM | POA: Diagnosis not present

## 2019-12-07 DIAGNOSIS — R809 Proteinuria, unspecified: Secondary | ICD-10-CM | POA: Diagnosis not present

## 2019-12-07 DIAGNOSIS — E669 Obesity, unspecified: Secondary | ICD-10-CM | POA: Diagnosis not present

## 2019-12-07 DIAGNOSIS — I129 Hypertensive chronic kidney disease with stage 1 through stage 4 chronic kidney disease, or unspecified chronic kidney disease: Secondary | ICD-10-CM | POA: Diagnosis not present

## 2019-12-30 DIAGNOSIS — R609 Edema, unspecified: Secondary | ICD-10-CM | POA: Diagnosis not present

## 2019-12-30 DIAGNOSIS — R7309 Other abnormal glucose: Secondary | ICD-10-CM | POA: Diagnosis not present

## 2019-12-30 DIAGNOSIS — E039 Hypothyroidism, unspecified: Secondary | ICD-10-CM | POA: Diagnosis not present

## 2019-12-30 DIAGNOSIS — M545 Low back pain, unspecified: Secondary | ICD-10-CM | POA: Diagnosis not present

## 2019-12-30 DIAGNOSIS — R202 Paresthesia of skin: Secondary | ICD-10-CM | POA: Diagnosis not present

## 2019-12-30 DIAGNOSIS — K219 Gastro-esophageal reflux disease without esophagitis: Secondary | ICD-10-CM | POA: Diagnosis not present

## 2019-12-30 DIAGNOSIS — E782 Mixed hyperlipidemia: Secondary | ICD-10-CM | POA: Diagnosis not present

## 2019-12-30 DIAGNOSIS — M8588 Other specified disorders of bone density and structure, other site: Secondary | ICD-10-CM | POA: Diagnosis not present

## 2019-12-30 DIAGNOSIS — E2839 Other primary ovarian failure: Secondary | ICD-10-CM | POA: Diagnosis not present

## 2019-12-30 DIAGNOSIS — R809 Proteinuria, unspecified: Secondary | ICD-10-CM | POA: Diagnosis not present

## 2019-12-30 DIAGNOSIS — Z23 Encounter for immunization: Secondary | ICD-10-CM | POA: Diagnosis not present

## 2019-12-30 DIAGNOSIS — I10 Essential (primary) hypertension: Secondary | ICD-10-CM | POA: Diagnosis not present

## 2020-01-12 DIAGNOSIS — R051 Acute cough: Secondary | ICD-10-CM | POA: Diagnosis not present

## 2020-01-12 DIAGNOSIS — R062 Wheezing: Secondary | ICD-10-CM | POA: Diagnosis not present

## 2020-01-12 DIAGNOSIS — J069 Acute upper respiratory infection, unspecified: Secondary | ICD-10-CM | POA: Diagnosis not present

## 2020-01-16 DIAGNOSIS — R7303 Prediabetes: Secondary | ICD-10-CM

## 2020-01-16 HISTORY — DX: Prediabetes: R73.03

## 2020-02-10 NOTE — Patient Instructions (Addendum)
DUE TO COVID-19 ONLY ONE VISITOR IS ALLOWED TO COME WITH YOU AND STAY IN THE WAITING ROOM ONLY DURING PRE OP AND PROCEDURE DAY OF SURGERY. THE 1 VISITOR  MAY VISIT WITH YOU AFTER SURGERY IN YOUR PRIVATE ROOM DURING VISITING HOURS ONLY!  YOU NEED TO HAVE A COVID 19 TEST ON: 02/18/20 @ 10:00 AM , THIS TEST MUST BE DONE BEFORE SURGERY,  COVID TESTING SITE [place] [address], IT IS ON THE RIGHT GOING OUT [address] APPROXIMATELY  2 MINUTES PAST [employer] ON THE RIGHT. ONCE YOUR COVID TEST IS COMPLETED,  PLEASE BEGIN THE QUARANTINE INSTRUCTIONS AS OUTLINED IN YOUR HANDOUT.                Ashley Mercer   Your procedure is scheduled on: 02/22/20   Report to [facility] Main  Entrance   Report to short stay at: 5:50 AM     Call this number if you have problems the morning of surgery [phone]    Remember:   NO SOLID FOOD AFTER MIDNIGHT THE NIGHT PRIOR TO SURGERY. NOTHING BY MOUTH EXCEPT CLEAR LIQUIDS UNTIL: 5:20 AM . PLEASE FINISH ENSURE DRINK PER SURGEON ORDER  WHICH NEEDS TO BE COMPLETED AT: 5:20 AM .  CLEAR LIQUID DIET   Foods Allowed                                                                     Foods Excluded  Coffee and tea, regular and decaf                             liquids that you cannot  Plain Jell-O any favor except red or purple                                           see through such as: Fruit ices (not with fruit pulp)                                     milk, soups, orange juice  Iced Popsicles                                    All solid food Carbonated beverages, regular and diet                                    Cranberry, grape and apple juices Sports drinks like Gatorade Lightly seasoned clear broth or consume(fat free) Sugar, honey syrup  Sample Menu Breakfast                                Lunch  Supper Cranberry juice                    Beef broth                             Chicken broth Jell-O                                     Grape juice                           Apple juice Coffee or tea                        Jell-O                                      Popsicle                                                Coffee or tea                        Coffee or tea  _____________________________________________________________________   BRUSH YOUR TEETH MORNING OF SURGERY AND RINSE YOUR MOUTH OUT, NO CHEWING GUM CANDY OR MINTS.    Take these medicines the morning of surgery with A SIP OF WATER: amlodipine,famotidine,levothyroxine,metoprolol,pregabalin,raloxifene.                               You may not have any metal on your body including hair pins and              piercings  Do not wear jewelry, make-up, lotions, powders or perfumes, deodorant             Do not wear nail polish on your fingernails.  Do not shave  48 hours prior to surgery.    Do not bring valuables to the hospital. [place].  Contacts, dentures or bridgework may not be worn into surgery.  Leave suitcase in the car. After surgery it may be brought to your room.     Patients discharged the day of surgery will not be allowed to drive home. IF YOU ARE HAVING SURGERY AND GOING HOME THE SAME DAY, YOU MUST HAVE AN ADULT TO DRIVE YOU HOME AND BE WITH YOU FOR 24 HOURS. YOU MAY GO HOME BY TAXI OR UBER OR ORTHERWISE, BUT AN ADULT MUST ACCOMPANY YOU HOME AND STAY WITH YOU FOR 24 HOURS.  Name and phone number of your driver:  Special Instructions: N/A              Please read over the following fact sheets you were given: _____________________________________________________________________          [facility] - Preparing for Surgery Before surgery, you can play an important role.  Because skin is not sterile, your skin needs to be as free of germs as possible.  You  can reduce the number of germs on your skin by washing with CHG (chlorahexidine  gluconate) soap before surgery.  CHG is an antiseptic cleaner which kills germs and bonds with the skin to continue killing germs even after washing. Please DO NOT use if you have an allergy to CHG or antibacterial soaps.  If your skin becomes reddened/irritated stop using the CHG and inform your nurse when you arrive at Short Stay. Do not shave (including legs and underarms) for at least 48 hours prior to the first CHG shower.  You may shave your face/neck. Please follow these instructions carefully:  1.  Shower with CHG Soap the night before surgery and the  morning of Surgery.  2.  If you choose to wash your hair, wash your hair first as usual with your  normal  shampoo.  3.  After you shampoo, rinse your hair and body thoroughly to remove the  shampoo.                           4.  Use CHG as you would any other liquid soap.  You can apply chg directly  to the skin and wash                       Gently with a scrungie or clean washcloth.  5.  Apply the CHG Soap to your body ONLY FROM THE NECK DOWN.   Do not use on face/ open                           Wound or open sores. Avoid contact with eyes, ears mouth and genitals (private parts).                       Wash face,  Genitals (private parts) with your normal soap.             6.  Wash thoroughly, paying special attention to the area where your surgery  will be performed.  7.  Thoroughly rinse your body with warm water from the neck down.  8.  DO NOT shower/wash with your normal soap after using and rinsing off  the CHG Soap.                9.  Pat yourself dry with a clean towel.            10.  Wear clean pajamas.            11.  Place clean sheets on your bed the night of your first shower and do not  sleep with pets. Day of Surgery : Do not apply any lotions/deodorants the morning of surgery.  Please wear clean clothes to the hospital/surgery center.  FAILURE TO FOLLOW THESE INSTRUCTIONS MAY RESULT IN THE CANCELLATION OF YOUR  SURGERY PATIENT SIGNATURE_________________________________  NURSE SIGNATURE__________________________________  ________________________________________________________________________   Adam Phenix  An incentive spirometer is a tool that can help keep your lungs clear and active. This tool measures how well you are filling your lungs with each breath. Taking long deep breaths may help reverse or decrease the chance of developing breathing (pulmonary) problems (especially infection) following:  A long period of time when you are unable to move or be active. BEFORE THE PROCEDURE   If the spirometer includes an indicator to show your best effort, your nurse or respiratory therapist will set  it to a desired goal.  If possible, sit up straight or lean slightly forward. Try not to slouch.  Hold the incentive spirometer in an upright position. INSTRUCTIONS FOR USE  1. Sit on the edge of your bed if possible, or sit up as far as you can in bed or on a chair. 2. Hold the incentive spirometer in an upright position. 3. Breathe out normally. 4. Place the mouthpiece in your mouth and seal your lips tightly around it. 5. Breathe in slowly and as deeply as possible, raising the piston or the ball toward the top of the column. 6. Hold your breath for 3-5 seconds or for as long as possible. Allow the piston or ball to fall to the bottom of the column. 7. Remove the mouthpiece from your mouth and breathe out normally. 8. Rest for a few seconds and repeat Steps 1 through 7 at least 10 times every 1-2 hours when you are awake. Take your time and take a few normal breaths between deep breaths. 9. The spirometer may include an indicator to show your best effort. Use the indicator as a goal to work toward during each repetition. 10. After each set of 10 deep breaths, practice coughing to be sure your lungs are clear. If you have an incision (the cut made at the time of surgery), support your  incision when coughing by placing a pillow or rolled up towels firmly against it. Once you are able to get out of bed, walk around indoors and cough well. You may stop using the incentive spirometer when instructed by your caregiver.  RISKS AND COMPLICATIONS  Take your time so you do not get dizzy or light-headed.  If you are in pain, you may need to take or ask for pain medication before doing incentive spirometry. It is harder to take a deep breath if you are having pain. AFTER USE  Rest and breathe slowly and easily.  It can be helpful to keep track of a log of your progress. Your caregiver can provide you with a simple table to help with this. If you are using the spirometer at home, follow these instructions: [place] IF:   You are having difficultly using the spirometer.  You have trouble using the spirometer as often as instructed.  Your pain medication is not giving enough relief while using the spirometer.  You develop fever of 100.5 F (38.1 C) or higher. SEEK IMMEDIATE MEDICAL CARE IF:   You cough up bloody sputum that had not been present before.  You develop fever of 102 F (38.9 C) or greater.  You develop worsening pain at or near the incision site. MAKE SURE YOU:   Understand these instructions.  Will watch your condition.  Will get help right away if you are not doing well or get worse. Document Released: 05/14/2006 Document Revised: 03/26/2011 Document Reviewed: 07/15/2006 [facility] Patient Information 2014 [place], [place].   ________________________________________________________________________

## 2020-02-11 ENCOUNTER — Other Ambulatory Visit: Payer: Self-pay

## 2020-02-11 ENCOUNTER — Encounter (HOSPITAL_COMMUNITY): Payer: Self-pay

## 2020-02-11 ENCOUNTER — Encounter (HOSPITAL_COMMUNITY)
Admission: RE | Admit: 2020-02-11 | Discharge: 2020-02-11 | Disposition: A | Discharge status: Home / Self Care | Payer: Medicare Other | Source: Ambulatory Visit | Attending: Orthopedic Surgery | Admitting: Orthopedic Surgery

## 2020-02-11 DIAGNOSIS — Z01812 Encounter for preprocedural laboratory examination: Secondary | ICD-10-CM | POA: Insufficient documentation

## 2020-02-11 DIAGNOSIS — Z6841 Body Mass Index (BMI) 40.0 and over, adult: Secondary | ICD-10-CM | POA: Diagnosis not present

## 2020-02-11 DIAGNOSIS — Z96651 Presence of right artificial knee joint: Secondary | ICD-10-CM | POA: Diagnosis not present

## 2020-02-11 DIAGNOSIS — M1712 Unilateral primary osteoarthritis, left knee: Secondary | ICD-10-CM | POA: Diagnosis not present

## 2020-02-11 DIAGNOSIS — Z7982 Long term (current) use of aspirin: Secondary | ICD-10-CM | POA: Insufficient documentation

## 2020-02-11 DIAGNOSIS — I447 Left bundle-branch block, unspecified: Secondary | ICD-10-CM | POA: Diagnosis not present

## 2020-02-11 DIAGNOSIS — K219 Gastro-esophageal reflux disease without esophagitis: Secondary | ICD-10-CM | POA: Insufficient documentation

## 2020-02-11 DIAGNOSIS — I10 Essential (primary) hypertension: Secondary | ICD-10-CM | POA: Diagnosis not present

## 2020-02-11 DIAGNOSIS — Z79890 Hormone replacement therapy (postmenopausal): Secondary | ICD-10-CM | POA: Insufficient documentation

## 2020-02-11 DIAGNOSIS — Z79899 Other long term (current) drug therapy: Secondary | ICD-10-CM | POA: Insufficient documentation

## 2020-02-11 DIAGNOSIS — Z791 Long term (current) use of non-steroidal anti-inflammatories (NSAID): Secondary | ICD-10-CM | POA: Diagnosis not present

## 2020-02-11 DIAGNOSIS — E039 Hypothyroidism, unspecified: Secondary | ICD-10-CM | POA: Diagnosis not present

## 2020-02-11 HISTORY — DX: Atrioventricular block, first degree: I44.0

## 2020-02-11 HISTORY — DX: Chronic kidney disease, unspecified: N18.9

## 2020-02-11 LAB — COMPREHENSIVE METABOLIC PANEL
ALT: 11 U/L (ref 0–44)
AST: 15 U/L (ref 15–41)
Albumin: 3.4 g/dL — ABNORMAL LOW (ref 3.5–5.0)
Alkaline Phosphatase: 70 U/L (ref 38–126)
Anion gap: 12 (ref 5–15)
BUN: 15 mg/dL (ref 8–23)
CO2: 26 mmol/L (ref 22–32)
Calcium: 9.4 mg/dL (ref 8.9–10.3)
Chloride: 101 mmol/L (ref 98–111)
Creatinine, Ser: 0.82 mg/dL (ref 0.44–1.00)
GFR, Estimated: >60 mL/min (ref >60)
Glucose, Bld: 111 mg/dL — ABNORMAL HIGH (ref 70–99)
Potassium: 3.9 mmol/L (ref 3.5–5.1)
Sodium: 139 mmol/L (ref 135–145)
Total Bilirubin: 0.7 mg/dL (ref 0.3–1.2)
Total Protein: 6.5 g/dL (ref 6.5–8.1)

## 2020-02-11 LAB — CBC
HCT: 42.3 % (ref 36.0–46.0)
Hemoglobin: 12.9 g/dL (ref 12.0–15.0)
MCH: 26.2 pg (ref 26.0–34.0)
MCHC: 30.5 g/dL (ref 30.0–36.0)
MCV: 86 fL (ref 80.0–100.0)
Platelets: 324 10*3/uL (ref 150–400)
RBC: 4.92 MIL/uL (ref 3.87–5.11)
RDW: 15.8 % — ABNORMAL HIGH (ref 11.5–15.5)
WBC: 7.7 10*3/uL (ref 4.0–10.5)
nRBC: 0 % (ref 0.0–0.2)

## 2020-02-11 LAB — SURGICAL PCR SCREEN
MRSA, PCR: NEGATIVE
Staphylococcus aureus: NEGATIVE

## 2020-02-11 LAB — APTT: aPTT: 36 seconds (ref 24–36)

## 2020-02-11 LAB — TYPE AND SCREEN
ABO/RH(D): A NEG
Antibody Screen: NEGATIVE

## 2020-02-11 LAB — PROTIME-INR
INR: 1 (ref 0.8–1.2)
Prothrombin Time: 13 seconds (ref 11.4–15.2)

## 2020-02-11 NOTE — Progress Notes (Signed)
COVID Vaccine Completed: Yes Date COVID Vaccine completed: 05/06/19 COVID vaccine manufacturer: Pfizer       PCP - Dr. Maury Dus Cardiologist -   Chest x-ray -  EKG - 04/29/19 Stress Test -  ECHO -  Cardiac Cath -  Pacemaker/ICD device last checked:  Sleep Study -  CPAP -   Fasting Blood Sugar -  Checks Blood Sugar _____ times a day  Blood Thinner Instructions: Aspirin Instructions: Last Dose:  Anesthesia review:   Patient denies shortness of breath, fever, cough and chest pain at PAT appointment   Patient verbalized understanding of instructions that were given to them at the PAT appointment. Patient was also instructed that they will need to review over the PAT instructions again at home before surgery.

## 2020-02-12 NOTE — Progress Notes (Signed)
Anesthesia Chart Review   Case: [id number] Date/Time: 02/22/20 0805   Procedure: TOTAL KNEE ARTHROPLASTY (Left Knee) - 71min   Anesthesia type: Choice   Pre-op diagnosis: left knee osteoarthritis   Location: [facility] ROOM 10 / [facility]   Surgeons: Gaynelle Arabian, MD      DISCUSSION:80 y.o. former smoker (2.5 pack years, quit 03/03/88) with h/o HTN, hypothyroidism, GERD, chronic LBBB, left knee OA scheduled for above procedure 02/22/20 with Dr. Gaynelle Arabian.   S/p C3-4 cervical fusion 05/01/19.   Anticipate pt can proceed with planned procedure barring acute status change.   VS: BP 123/68   Pulse (!) 54   Temp 36.8 C (Oral)   Ht 4\' 11"  (1.499 m)   Wt 90.9 kg   SpO2 96%   BMI 40.48 kg/m   PROVIDERS: Maury Dus, MD is PCP   LABS: Labs reviewed: Acceptable for surgery. (all labs ordered are listed, but only abnormal results are displayed)  Labs Reviewed  CBC - Abnormal; Notable for the following components:      Result Value   RDW 15.8 (*)    All other components within normal limits  COMPREHENSIVE METABOLIC PANEL - Abnormal; Notable for the following components:   Glucose, Bld 111 (*)    Albumin 3.4 (*)    All other components within normal limits  SURGICAL PCR SCREEN  PROTIME-INR  APTT  TYPE AND SCREEN     IMAGES:   EKG: 04/29/19 Rate 62 bpm Normal sinus rhythm Left bundle branch block Abnormal ECG No significant change since last tracing   CV:  Past Medical History:  Diagnosis Date  . Anemia    during pregnancy  . Anxiety   . Arthritis   . Cervical spinal stenosis   . Chronic back pain   . Chronic kidney disease    protein in urine  . Dry skin   . Eczema   . Fatty tumor    Right wrist  . Frequent UTI   . GERD (gastroesophageal reflux disease)   . Heart block AV first degree   . High cholesterol   . History of blood in urine    Pt was seen by [facility]; scant amount; no current issues  . Hypertension   . Hypothyroidism   . Lumbar spinal  stenosis   . Numbness and tingling in hands    Bilateral  . Pneumonia   . Proteinuria 2020   seeing a kidney specialist  . Thyroid condition     Past Surgical History:  Procedure Laterality Date  . ANTERIOR CERVICAL DECOMP/DISCECTOMY FUSION N/A 05/01/2019   Procedure: Cervical Three-Four Anterior cervical decompression/discectomy/fusion;  Surgeon: Ashok Pall, MD;  Location: [place];  Service: Neurosurgery;  Laterality: N/A;  anterior  . APPENDECTOMY    . BREAST BIOPSY Left 2006   benign  . BREAST EXCISIONAL BIOPSY Left 2006   papilloma  . CERVICAL SPINE SURGERY  01/28/2009  . COLONOSCOPY W/ POLYPECTOMY    . EYE SURGERY Bilateral 01/2017   cataract surgery  . JOINT REPLACEMENT     Right knee  . LUMBAR LAMINECTOMY/DECOMPRESSION MICRODISCECTOMY N/A 05/10/2017   Procedure: LAMINECTOMY LUMBAR 5- SACRAL 1;  Surgeon: Ashok Pall, MD;  Location: [place];  Service: Neurosurgery;  Laterality: N/A;  LAMINECTOMY LUMBAR 5- SACRAL 1  . LUMBAR SPINE SURGERY  2018  . THYROIDECTOMY  1971  . TONSILLECTOMY    . TOTAL ABDOMINAL HYSTERECTOMY  1986  . TOTAL KNEE ARTHROPLASTY Right 03/09/2019   Procedure: TOTAL KNEE ARTHROPLASTY;  Surgeon: Gaynelle Arabian, MD;  [facility];  Service: Orthopedics;  Laterality: Right;  75min    MEDICATIONS: . diphenhydrAMINE (BENADRYL) 25 mg capsule  . diphenhydrAMINE (BENADRYL) 50 MG capsule  . acetaminophen (TYLENOL) 500 MG tablet  . amLODipine (NORVASC) 5 MG tablet  . aspirin EC 81 MG tablet  . Calcium-Magnesium (CAL-MAG PO)  . clobetasol ointment (TEMOVATE) 0.05 %  . colesevelam (WELCHOL) 625 MG tablet  . cyclobenzaprine (FLEXERIL) 10 MG tablet  . docusate sodium (COLACE) 100 MG capsule  . DULoxetine (CYMBALTA) 60 MG capsule  . famotidine (PEPCID) 40 MG tablet  . furosemide (LASIX) 20 MG tablet  . gemfibrozil (LOPID) 600 MG tablet  . ibuprofen (ADVIL) 800 MG tablet  . levothyroxine (SYNTHROID, LEVOTHROID) 100 MCG tablet  . metoprolol succinate  (TOPROL-XL) 100 MG 24 hr tablet  . Multiple Vitamin (MULTIVITAMIN WITH MINERALS) TABS tablet  . pregabalin (LYRICA) 75 MG capsule  . quinapril-hydrochlorothiazide (ACCURETIC) 20-25 MG per tablet  . raloxifene (EVISTA) 60 MG tablet   No current facility-administered medications for this encounter.    Konrad Felix, PA-C [facility] [phone]

## 2020-02-18 ENCOUNTER — Other Ambulatory Visit (HOSPITAL_COMMUNITY)
Admission: RE | Admit: 2020-02-18 | Discharge: 2020-02-18 | Disposition: A | Discharge status: Home / Self Care | Payer: Medicare Other | Source: Ambulatory Visit | Attending: Orthopedic Surgery | Admitting: Orthopedic Surgery

## 2020-02-18 DIAGNOSIS — Z20822 Contact with and (suspected) exposure to covid-19: Secondary | ICD-10-CM | POA: Diagnosis not present

## 2020-02-18 DIAGNOSIS — Z01812 Encounter for preprocedural laboratory examination: Secondary | ICD-10-CM | POA: Diagnosis not present

## 2020-02-18 LAB — SARS CORONAVIRUS 2 (TAT 6-24 HRS): SARS Coronavirus 2: NEGATIVE

## 2020-02-19 NOTE — H&P (Signed)
TOTAL KNEE ADMISSION H&P  Patient is being admitted for left total knee arthroplasty.  Subjective:  Chief Complaint:left knee pain.  HPI: Ashley Mercer, 80 y.o. female, has a history of pain and functional disability in the left knee due to arthritis and has failed non-surgical conservative treatments for greater than 12 weeks to includecorticosteriod injections and activity modification.  Onset of symptoms was gradual, starting 2 years ago with gradually worsening course since that time. The patient noted no past surgery on the left knee(s).  Patient currently rates pain in the left knee(s) at 7 out of 10 with activity. Patient has worsening of pain with activity and weight bearing and pain that interferes with activities of daily living.  Patient has evidence of joint space narrowing by imaging studies.There is no active infection.  Patient Active Problem List   Diagnosis Date Noted  . HNP (herniated nucleus pulposus) with myelopathy, cervical 05/01/2019  . S/P total knee arthroplasty, right 03/10/2019  . OA (osteoarthritis) of knee 03/09/2019  . Osteoarthritis of right knee 03/09/2019  . Lumbar stenosis with neurogenic claudication 05/10/2017  . Spondylolisthesis of lumbar region 05/14/2014  . Spinal stenosis of lumbar region 10/14/2012  . Spondylosis, cervical, with myelopathy 10/14/2012  . Gait difficulty 10/14/2012   Past Medical History:  Diagnosis Date  . Anemia    during pregnancy  . Anxiety   . Arthritis   . Cervical spinal stenosis   . Chronic back pain   . Chronic kidney disease    protein in urine  . Dry skin   . Eczema   . Fatty tumor    Right wrist  . Frequent UTI   . GERD (gastroesophageal reflux disease)   . Heart block AV first degree   . High cholesterol   . History of blood in urine    Pt was seen by [facility]; scant amount; no current issues  . Hypertension   . Hypothyroidism   . Lumbar spinal stenosis   . Numbness and tingling in hands     Bilateral  . Pneumonia   . Proteinuria 2020   seeing a kidney specialist  . Thyroid condition     Past Surgical History:  Procedure Laterality Date  . ANTERIOR CERVICAL DECOMP/DISCECTOMY FUSION N/A 05/01/2019   Procedure: Cervical Three-Four Anterior cervical decompression/discectomy/fusion;  Surgeon: Ashok Pall, MD;  Location: [facility];  Service: Neurosurgery;  Laterality: N/A;  anterior  . APPENDECTOMY    . BREAST BIOPSY Left 2006   benign  . BREAST EXCISIONAL BIOPSY Left 2006   papilloma  . CERVICAL SPINE SURGERY  01/28/2009  . COLONOSCOPY W/ POLYPECTOMY    . EYE SURGERY Bilateral 01/2017   cataract surgery  . JOINT REPLACEMENT     Right knee  . LUMBAR LAMINECTOMY/DECOMPRESSION MICRODISCECTOMY N/A 05/10/2017   Procedure: LAMINECTOMY LUMBAR 5- SACRAL 1;  Surgeon: Ashok Pall, MD;  Location: [place];  Service: Neurosurgery;  Laterality: N/A;  LAMINECTOMY LUMBAR 5- SACRAL 1  . LUMBAR SPINE SURGERY  2018  . THYROIDECTOMY  1971  . TONSILLECTOMY    . TOTAL ABDOMINAL HYSTERECTOMY  1986  . TOTAL KNEE ARTHROPLASTY Right 03/09/2019   Procedure: TOTAL KNEE ARTHROPLASTY;  Surgeon: Gaynelle Arabian, MD;  Location: [facility];  Service: Orthopedics;  Laterality: Right;  76min    No current facility-administered medications for this encounter.   Current Outpatient Medications  Medication Sig Dispense Refill Last Dose  . acetaminophen (TYLENOL) 500 MG tablet Take 1,000 mg by mouth every 6 (six) hours  as needed (back pain.).     Marland Kitchen amLODipine (NORVASC) 5 MG tablet Take 5 mg by mouth daily.      Marland Kitchen aspirin EC 81 MG tablet Take 1 tablet (81 mg total) by mouth daily.     . Calcium-Magnesium (CAL-MAG PO) Take 1 tablet by mouth daily.     . clobetasol ointment (TEMOVATE) 1.61 % Apply 1 application topically 2 (two) times daily as needed (eczema).      . colesevelam (WELCHOL) 625 MG tablet Take 1,250 mg by mouth 2 (two) times daily with a meal.     . cyclobenzaprine (FLEXERIL) 10 MG tablet Take 1 tablet (10  mg total) by mouth 3 (three) times daily as needed for muscle spasms. (Patient taking differently: Take 5-10 mg by mouth 3 (three) times daily as needed for muscle spasms.) 40 tablet 0   . docusate sodium (COLACE) 100 MG capsule Take 100 mg by mouth daily as needed for moderate constipation.     . DULoxetine (CYMBALTA) 60 MG capsule Take 60 mg by mouth at bedtime.      . famotidine (PEPCID) 40 MG tablet Take 40 mg by mouth at bedtime as needed for heartburn or indigestion.     . furosemide (LASIX) 20 MG tablet Take 20 mg by mouth daily.      Marland Kitchen gemfibrozil (LOPID) 600 MG tablet Take 600 mg by mouth 2 (two) times daily.      Marland Kitchen ibuprofen (ADVIL) 800 MG tablet Take 800 mg by mouth in the morning and at bedtime.     Marland Kitchen levothyroxine (SYNTHROID, LEVOTHROID) 100 MCG tablet Take 100 mcg by mouth daily before breakfast.     . metoprolol succinate (TOPROL-XL) 100 MG 24 hr tablet Take 100 mg by mouth daily.   0   . Multiple Vitamin (MULTIVITAMIN WITH MINERALS) TABS tablet Take 1 tablet by mouth daily.     . pregabalin (LYRICA) 75 MG capsule Take 75 mg by mouth 2 (two) times daily.     . quinapril-hydrochlorothiazide (ACCURETIC) 20-25 MG per tablet Take 1 tablet by mouth daily.     . raloxifene (EVISTA) 60 MG tablet Take 60 mg by mouth daily.     . diphenhydrAMINE (BENADRYL) 25 mg capsule Take 25 mg by mouth every 6 (six) hours as needed for itching.     . diphenhydrAMINE (BENADRYL) 50 MG capsule Take 50 mg by mouth every 6 (six) hours as needed for itching.      No Known Allergies  Social History   Tobacco Use  . Smoking status: Former Smoker    Packs/day: 0.25    Years: 10.00    Pack years: 2.50    Types: Cigarettes    Quit date: 03/03/1988    Years since quitting: 31.9  . Smokeless tobacco: Never Used  Substance Use Topics  . Alcohol use: Yes    Comment: quit in 1997; wine occasionally    Family History  Problem Relation Age of Onset  . Heart disease Mother   . Diabetes Mother   . Cancer  Maternal Grandfather   . Breast cancer Neg Hx      Review of Systems  Constitutional: Negative for chills and fever.  Respiratory: Negative for cough and shortness of breath.   Cardiovascular: Negative for chest pain.  Gastrointestinal: Negative for nausea and vomiting.  Musculoskeletal: Positive for arthralgias.    Objective:  Physical Exam Patient is an 80 year old female.  Well nourished and well developed. General: Alert and oriented x3,  cooperative and pleasant, no acute distress. Head: normocephalic, atraumatic, neck supple. Eyes: EOMI. Respiratory: breath sounds clear in all fields, no wheezing, rales, or rhonchi. Cardiovascular: Regular rate and rhythm, no murmurs, gallops or rubs.  Musculoskeletal:  Left Knee Exam: Mild tenderness to palpation about the medial joint line of the right knee. AROM 5-110 degrees. No effusion noted. No instability. Moderate patellofemoral crepitus. Slight varus deformity.  Calves soft and nontender. Motor function intact in LE. Strength 5/5 LE bilaterally. Neuro: Distal pulses 2+. Sensation to light touch intact in LE.  Vital signs in last 24 hours:    Labs:   Estimated body mass index is 40.48 kg/m as calculated from the following:   Height as of 02/11/20: 4\' 11"  (1.499 m).   Weight as of 02/11/20: 90.9 kg.   Imaging Review Plain radiographs demonstrate severe degenerative joint disease of the left knee(s). The overall alignment isneutral. The bone quality appears to be adequate for age and reported activity level.  Assessment/Plan:  End stage arthritis, left knee   The patient history, physical examination, clinical judgment of the provider and imaging studies are consistent with end stage degenerative joint disease of the left knee(s) and total knee arthroplasty is deemed medically necessary. The treatment options including medical management, injection therapy arthroscopy and arthroplasty were discussed at length. The  risks and benefits of total knee arthroplasty were presented and reviewed. The risks due to aseptic loosening, infection, stiffness, patella tracking problems, thromboembolic complications and other imponderables were discussed. The patient acknowledged the explanation, agreed to proceed with the plan and consent was signed. Patient is being admitted for inpatient treatment for surgery, pain control, PT, OT, prophylactic antibiotics, VTE prophylaxis, progressive ambulation and ADL's and discharge planning. The patient is planning to be discharged home.   Therapy Plans: outpatient therapy at [facility] Disposition: Home with daughter Planned DVT Prophylaxis: aspirin 325mg  BID DME needed: none PCP: Dr. Herbie Baltimore Ready, clearance received TXA: IV Allergies: NKDA Anesthesia Concerns: none BMI: 39.7 Not diabetic.  Other: Oxycodone is okay, flexeril for muscle relaxant. No tramadol, has not worked in the past. We will try same day discharge, but may require overnight stay.   Patient's anticipated LOS is less than 2 midnights, meeting these requirements: - Younger than 37 - Lives within 1 hour of care - Has a competent adult at home to recover with post-op recover - NO history of  - Chronic pain requiring opiods  - Diabetes  - Coronary Artery Disease  - Heart failure  - Heart attack  - Stroke  - DVT/VTE  - Cardiac arrhythmia  - Respiratory Failure/COPD  - Renal failure  - Anemia  - Advanced Liver disease  - Patient was instructed on what medications to stop prior to surgery. - Follow-up visit in 2 weeks with Dr. Wynelle Link - Begin physical therapy following surgery - Pre-operative lab work as pre-surgical testing - Prescriptions will be provided in hospital at time of discharge  Griffith Citron, PA-C Orthopedic Surgery [facility] [place] [phone]

## 2020-02-21 MED ORDER — BUPIVACAINE LIPOSOME 1.3 % IJ SUSP
20.0000 mL | Freq: Once | INTRAMUSCULAR | Status: DC
  Filled 2020-02-21: qty 20

## 2020-02-22 ENCOUNTER — Encounter (HOSPITAL_COMMUNITY)
Admission: RE | Disposition: A | Discharge status: Home / Self Care | Payer: Self-pay | Source: Home / Self Care | Attending: Orthopedic Surgery

## 2020-02-22 ENCOUNTER — Ambulatory Visit (HOSPITAL_COMMUNITY): Payer: Medicare Other | Admitting: Physician Assistant

## 2020-02-22 ENCOUNTER — Ambulatory Visit (HOSPITAL_COMMUNITY)
Admission: RE | Admit: 2020-02-22 | Discharge: 2020-02-22 | Disposition: A | Discharge status: Home / Self Care | Payer: Medicare Other | Attending: Orthopedic Surgery | Admitting: Orthopedic Surgery

## 2020-02-22 ENCOUNTER — Ambulatory Visit (HOSPITAL_COMMUNITY): Payer: Medicare Other | Admitting: Certified Registered Nurse Anesthetist

## 2020-02-22 ENCOUNTER — Encounter (HOSPITAL_COMMUNITY): Payer: Self-pay | Admitting: Orthopedic Surgery

## 2020-02-22 DIAGNOSIS — Z87891 Personal history of nicotine dependence: Secondary | ICD-10-CM | POA: Diagnosis not present

## 2020-02-22 DIAGNOSIS — Z809 Family history of malignant neoplasm, unspecified: Secondary | ICD-10-CM | POA: Insufficient documentation

## 2020-02-22 DIAGNOSIS — Z96651 Presence of right artificial knee joint: Secondary | ICD-10-CM | POA: Insufficient documentation

## 2020-02-22 DIAGNOSIS — E039 Hypothyroidism, unspecified: Secondary | ICD-10-CM | POA: Diagnosis not present

## 2020-02-22 DIAGNOSIS — Z79899 Other long term (current) drug therapy: Secondary | ICD-10-CM | POA: Insufficient documentation

## 2020-02-22 DIAGNOSIS — Z7982 Long term (current) use of aspirin: Secondary | ICD-10-CM | POA: Insufficient documentation

## 2020-02-22 DIAGNOSIS — N189 Chronic kidney disease, unspecified: Secondary | ICD-10-CM | POA: Diagnosis not present

## 2020-02-22 DIAGNOSIS — M1712 Unilateral primary osteoarthritis, left knee: Secondary | ICD-10-CM | POA: Diagnosis not present

## 2020-02-22 DIAGNOSIS — I129 Hypertensive chronic kidney disease with stage 1 through stage 4 chronic kidney disease, or unspecified chronic kidney disease: Secondary | ICD-10-CM | POA: Diagnosis not present

## 2020-02-22 DIAGNOSIS — E7800 Pure hypercholesterolemia, unspecified: Secondary | ICD-10-CM | POA: Diagnosis not present

## 2020-02-22 DIAGNOSIS — Z833 Family history of diabetes mellitus: Secondary | ICD-10-CM | POA: Insufficient documentation

## 2020-02-22 DIAGNOSIS — G8918 Other acute postprocedural pain: Secondary | ICD-10-CM | POA: Diagnosis not present

## 2020-02-22 DIAGNOSIS — Z8249 Family history of ischemic heart disease and other diseases of the circulatory system: Secondary | ICD-10-CM | POA: Insufficient documentation

## 2020-02-22 HISTORY — PX: TOTAL KNEE ARTHROPLASTY: SHX125

## 2020-02-22 SURGERY — ARTHROPLASTY, KNEE, TOTAL
Anesthesia: Monitor Anesthesia Care | Site: Knee | Laterality: Left

## 2020-02-22 MED ORDER — OXYCODONE HCL 5 MG/5ML PO SOLN: 5.0000 mg | Freq: Once | ORAL | Status: DC | PRN

## 2020-02-22 MED ORDER — LIDOCAINE HCL (PF) 2 % IJ SOLN
INTRAMUSCULAR | Status: AC
  Filled 2020-02-22: qty 5

## 2020-02-22 MED ORDER — STERILE WATER FOR IRRIGATION IR SOLN
Status: DC | PRN
  Administered 2020-02-22: 2000 mL

## 2020-02-22 MED ORDER — ONDANSETRON HCL 4 MG/2ML IJ SOLN
INTRAMUSCULAR | Status: DC | PRN
  Administered 2020-02-22: 4 mg via INTRAVENOUS

## 2020-02-22 MED ORDER — PROPOFOL 500 MG/50ML IV EMUL
INTRAVENOUS | Status: AC
  Filled 2020-02-22: qty 50

## 2020-02-22 MED ORDER — MIDAZOLAM HCL 2 MG/2ML IJ SOLN
INTRAMUSCULAR | Status: AC
  Filled 2020-02-22: qty 2

## 2020-02-22 MED ORDER — OXYCODONE HCL 5 MG PO TABS: 5.0000 mg | ORAL_TABLET | Freq: Four times a day (QID) | ORAL | 0 refills | Status: DC | PRN

## 2020-02-22 MED ORDER — PROPOFOL 10 MG/ML IV BOLUS
INTRAVENOUS | Status: AC
  Filled 2020-02-22: qty 20

## 2020-02-22 MED ORDER — EPHEDRINE SULFATE-NACL 50-0.9 MG/10ML-% IV SOSY
PREFILLED_SYRINGE | INTRAVENOUS | Status: DC | PRN
  Administered 2020-02-22 (×10): 10 mg via INTRAVENOUS

## 2020-02-22 MED ORDER — POVIDONE-IODINE 10 % EX SWAB: 2.0000 "application " | Freq: Once | CUTANEOUS | Status: DC

## 2020-02-22 MED ORDER — LACTATED RINGERS IV SOLN: INTRAVENOUS | Status: DC

## 2020-02-22 MED ORDER — LACTATED RINGERS IV BOLUS: 250.0000 mL | Freq: Once | INTRAVENOUS | Status: DC

## 2020-02-22 MED ORDER — FENTANYL CITRATE (PF) 100 MCG/2ML IJ SOLN
50.0000 ug | INTRAMUSCULAR | Status: DC
  Administered 2020-02-22: 50 ug via INTRAVENOUS
  Filled 2020-02-22: qty 2

## 2020-02-22 MED ORDER — LACTATED RINGERS IV BOLUS
250.0000 mL | Freq: Once | INTRAVENOUS | Status: AC
  Administered 2020-02-22: 250 mL via INTRAVENOUS

## 2020-02-22 MED ORDER — FENTANYL CITRATE (PF) 100 MCG/2ML IJ SOLN
INTRAMUSCULAR | Status: DC | PRN
  Administered 2020-02-22: 25 ug via INTRAVENOUS

## 2020-02-22 MED ORDER — ASPIRIN EC 325 MG PO TBEC: 325.0000 mg | DELAYED_RELEASE_TABLET | Freq: Two times a day (BID) | ORAL | 0 refills | Status: AC

## 2020-02-22 MED ORDER — PROPOFOL 500 MG/50ML IV EMUL
INTRAVENOUS | Status: DC | PRN
  Administered 2020-02-22: 25 mg via INTRAVENOUS
  Administered 2020-02-22: 20 mg via INTRAVENOUS

## 2020-02-22 MED ORDER — MIDAZOLAM HCL 5 MG/5ML IJ SOLN
INTRAMUSCULAR | Status: DC | PRN
  Administered 2020-02-22 (×2): 1 mg via INTRAVENOUS

## 2020-02-22 MED ORDER — BUPIVACAINE LIPOSOME 1.3 % IJ SUSP
INTRAMUSCULAR | Status: DC | PRN
  Administered 2020-02-22: 20 mL

## 2020-02-22 MED ORDER — FENTANYL CITRATE (PF) 100 MCG/2ML IJ SOLN
INTRAMUSCULAR | Status: AC
  Filled 2020-02-22: qty 2

## 2020-02-22 MED ORDER — ONDANSETRON HCL 4 MG/2ML IJ SOLN
INTRAMUSCULAR | Status: AC
  Filled 2020-02-22: qty 2

## 2020-02-22 MED ORDER — PROPOFOL 500 MG/50ML IV EMUL
INTRAVENOUS | Status: DC | PRN
  Administered 2020-02-22: 15 ug/kg/min via INTRAVENOUS

## 2020-02-22 MED ORDER — EPHEDRINE 5 MG/ML INJ
INTRAVENOUS | Status: AC
  Filled 2020-02-22: qty 10

## 2020-02-22 MED ORDER — TRANEXAMIC ACID-NACL 1000-0.7 MG/100ML-% IV SOLN
1000.0000 mg | INTRAVENOUS | Status: AC
  Administered 2020-02-22: 1000 mg via INTRAVENOUS
  Filled 2020-02-22: qty 100

## 2020-02-22 MED ORDER — SODIUM CHLORIDE (PF) 0.9 % IJ SOLN
INTRAMUSCULAR | Status: DC | PRN
  Administered 2020-02-22: 60 mL

## 2020-02-22 MED ORDER — CHLORHEXIDINE GLUCONATE 0.12 % MT SOLN: 15.0000 mL | Freq: Once | OROMUCOSAL | Status: AC

## 2020-02-22 MED ORDER — MIDAZOLAM HCL 2 MG/2ML IJ SOLN
INTRAMUSCULAR | Status: AC
  Administered 2020-02-22: 1 mg via INTRAVENOUS
  Filled 2020-02-22: qty 2

## 2020-02-22 MED ORDER — PROMETHAZINE HCL 25 MG/ML IJ SOLN: 6.2500 mg | INTRAMUSCULAR | Status: DC | PRN

## 2020-02-22 MED ORDER — HYDROMORPHONE HCL 1 MG/ML IJ SOLN: 0.2500 mg | INTRAMUSCULAR | Status: DC | PRN

## 2020-02-22 MED ORDER — OXYCODONE HCL 5 MG PO TABS: 5.0000 mg | ORAL_TABLET | Freq: Once | ORAL | Status: DC | PRN

## 2020-02-22 MED ORDER — TRANEXAMIC ACID-NACL 1000-0.7 MG/100ML-% IV SOLN
INTRAVENOUS | Status: DC | PRN
  Administered 2020-02-22: 1000 mg via INTRAVENOUS

## 2020-02-22 MED ORDER — CYCLOBENZAPRINE HCL 10 MG PO TABS: 10.0000 mg | ORAL_TABLET | Freq: Three times a day (TID) | ORAL | 0 refills | Status: DC | PRN

## 2020-02-22 MED ORDER — SODIUM CHLORIDE 0.9 % IR SOLN
Status: DC | PRN
  Administered 2020-02-22: 1000 mL

## 2020-02-22 MED ORDER — MIDAZOLAM HCL 2 MG/2ML IJ SOLN: 1.0000 mg | INTRAMUSCULAR | Status: DC

## 2020-02-22 MED ORDER — ROPIVACAINE HCL 5 MG/ML IJ SOLN
INTRAMUSCULAR | Status: DC | PRN
Start: 2020-02-22 — End: 2020-02-22
  Administered 2020-02-22: 20 mL via PERINEURAL

## 2020-02-22 MED ORDER — ORAL CARE MOUTH RINSE
15.0000 mL | Freq: Once | OROMUCOSAL | Status: AC
  Administered 2020-02-22: 15 mL via OROMUCOSAL

## 2020-02-22 MED ORDER — BUPIVACAINE HCL (PF) 0.75 % IJ SOLN
INTRAMUSCULAR | Status: DC | PRN
  Administered 2020-02-22: 1.6 mL via INTRATHECAL

## 2020-02-22 MED ORDER — LACTATED RINGERS IV BOLUS
500.0000 mL | Freq: Once | INTRAVENOUS | Status: AC
  Administered 2020-02-22: 500 mL via INTRAVENOUS

## 2020-02-22 MED ORDER — ACETAMINOPHEN 10 MG/ML IV SOLN
1000.0000 mg | Freq: Four times a day (QID) | INTRAVENOUS | Status: DC
  Administered 2020-02-22: 1000 mg via INTRAVENOUS
  Filled 2020-02-22: qty 100

## 2020-02-22 MED ORDER — DEXAMETHASONE SODIUM PHOSPHATE 10 MG/ML IJ SOLN
8.0000 mg | Freq: Once | INTRAMUSCULAR | Status: AC
  Administered 2020-02-22: 10 mg via INTRAVENOUS

## 2020-02-22 MED ORDER — DEXAMETHASONE SODIUM PHOSPHATE 10 MG/ML IJ SOLN
INTRAMUSCULAR | Status: AC
  Filled 2020-02-22: qty 1

## 2020-02-22 MED ORDER — CEFAZOLIN SODIUM-DEXTROSE 2-4 GM/100ML-% IV SOLN
2.0000 g | INTRAVENOUS | Status: AC
  Administered 2020-02-22: 2 g via INTRAVENOUS
  Filled 2020-02-22: qty 100

## 2020-02-22 SURGICAL SUPPLY — 54 items
BAG ZIPLOCK 12X15 (MISCELLANEOUS) ×2 IMPLANT
BLADE SAG 18X100X1.27 (BLADE) ×2 IMPLANT
BLADE SAW SGTL 11.0X1.19X90.0M (BLADE) ×2 IMPLANT
BNDG ELASTIC 6X5.8 VLCR STR LF (GAUZE/BANDAGES/DRESSINGS) ×2 IMPLANT
BOWL SMART MIX CTS (DISPOSABLE) ×2 IMPLANT
CEMENT HV SMART SET (Cement) ×4 IMPLANT
CEMENT TIBIA MBT SIZE 2.5 (Knees) ×1 IMPLANT
COVER SURGICAL LIGHT HANDLE (MISCELLANEOUS) ×2 IMPLANT
COVER WAND RF STERILE (DRAPES) ×2 IMPLANT
CUFF TOURN SGL QUICK 34 (TOURNIQUET CUFF) ×2
CUFF TRNQT CYL 34X4.125X (TOURNIQUET CUFF) ×1 IMPLANT
DECANTER SPIKE VIAL GLASS SM (MISCELLANEOUS) IMPLANT
DRAPE U-SHAPE 47X51 STRL (DRAPES) ×2 IMPLANT
DRSG AQUACEL AG ADV 3.5X10 (GAUZE/BANDAGES/DRESSINGS) ×2 IMPLANT
DURAPREP 26ML APPLICATOR (WOUND CARE) ×2 IMPLANT
ELECT REM PT RETURN 15FT ADLT (MISCELLANEOUS) ×2 IMPLANT
GLOVE SRG 8 PF TXTR STRL LF DI (GLOVE) ×1 IMPLANT
GLOVE SURG ENC MOIS LTX SZ6 (GLOVE) ×2 IMPLANT
GLOVE SURG ENC MOIS LTX SZ7 (GLOVE) ×2 IMPLANT
GLOVE SURG ENC MOIS LTX SZ8 (GLOVE) ×2 IMPLANT
GLOVE SURG UNDER POLY LF SZ6.5 (GLOVE) ×2 IMPLANT
GLOVE SURG UNDER POLY LF SZ8 (GLOVE) ×2
GLOVE SURG UNDER POLY LF SZ8.5 (GLOVE) ×2 IMPLANT
GOWN STRL REUS W/TWL LRG LVL3 (GOWN DISPOSABLE) ×4 IMPLANT
GOWN STRL REUS W/TWL XL LVL3 (GOWN DISPOSABLE) ×2 IMPLANT
HANDPIECE INTERPULSE COAX TIP (DISPOSABLE) ×2
HOLDER FOLEY CATH W/STRAP (MISCELLANEOUS) IMPLANT
IMMOBILIZER KNEE 20 (SOFTGOODS) ×2
IMMOBILIZER KNEE 20 THIGH 36 (SOFTGOODS) ×1 IMPLANT
IMPL FEMUR SIGMA LT PS SZ 3 (Knees) ×1 IMPLANT
IMPLANT FEMUR SIGMA LT PS SZ 3 (Knees) ×2 IMPLANT
INSERT TIBIAL PFC SIG SZ3 10MM (Knees) ×2 IMPLANT
KIT TURNOVER KIT A (KITS) ×2 IMPLANT
MANIFOLD NEPTUNE II (INSTRUMENTS) ×2 IMPLANT
NS IRRIG 1000ML POUR BTL (IV SOLUTION) ×2 IMPLANT
PACK TOTAL KNEE CUSTOM (KITS) ×2 IMPLANT
PADDING CAST COTTON 6X4 STRL (CAST SUPPLIES) ×2 IMPLANT
PATELLA DOME PFC 35MM (Knees) ×2 IMPLANT
PENCIL SMOKE EVACUATOR (MISCELLANEOUS) ×2 IMPLANT
PIN DRILL FIX HALF THREAD (BIT) ×2 IMPLANT
PIN STEINMAN FIXATION KNEE (PIN) ×2 IMPLANT
PROTECTOR NERVE ULNAR (MISCELLANEOUS) ×2 IMPLANT
SET HNDPC FAN SPRY TIP SCT (DISPOSABLE) ×1 IMPLANT
STRIP CLOSURE SKIN 1/2X4 (GAUZE/BANDAGES/DRESSINGS) ×2 IMPLANT
SUT MNCRL AB 4-0 PS2 18 (SUTURE) ×2 IMPLANT
SUT STRATAFIX 0 PDS 27 VIOLET (SUTURE) ×2
SUT VIC AB 2-0 CT1 27 (SUTURE) ×6
SUT VIC AB 2-0 CT1 TAPERPNT 27 (SUTURE) ×3 IMPLANT
SUTURE STRATFX 0 PDS 27 VIOLET (SUTURE) ×1 IMPLANT
TIBIA MBT CEMENT SIZE 2.5 (Knees) ×2 IMPLANT
TRAY CATH INTERMITTENT SS 16FR (CATHETERS) ×2 IMPLANT
TRAY FOLEY MTR SLVR 16FR STAT (SET/KITS/TRAYS/PACK) ×2 IMPLANT
WATER STERILE IRR 1000ML POUR (IV SOLUTION) ×4 IMPLANT
WRAP KNEE MAXI GEL POST OP (GAUZE/BANDAGES/DRESSINGS) ×2 IMPLANT

## 2020-02-22 NOTE — Anesthesia Preprocedure Evaluation (Signed)
Anesthesia Evaluation  Patient identified by MRN, date of birth, ID band Patient awake    Reviewed: Allergy & Precautions, NPO status , Patient's Chart, lab work & pertinent test results, reviewed documented beta blocker date and time   History of Anesthesia Complications Negative for: history of anesthetic complications  Airway Mallampati: III  TM Distance: >3 FB Neck ROM: Full    Dental  (+) Dental Advisory Given   Pulmonary former smoker,    breath sounds clear to auscultation       Cardiovascular hypertension, Pt. on medications and Pt. on home beta blockers (-) angina(-) Past MI and (-) CHF  Rhythm:Regular     Neuro/Psych PSYCHIATRIC DISORDERS Anxiety C 3-4 stenosis    GI/Hepatic GERD  Medicated and Controlled,  Endo/Other  Hypothyroidism   Renal/GU      Musculoskeletal  (+) Arthritis ,   Abdominal   Peds  Hematology Plt 358 NO anticoags   Anesthesia Other Findings   Reproductive/Obstetrics                             Anesthesia Physical  Anesthesia Plan  ASA: III  Anesthesia Plan: MAC, Regional and Spinal   Post-op Pain Management:  Regional for Post-op pain   Induction: Intravenous  PONV Risk Score and Plan: 2 and Treatment may vary due to age or medical condition, Ondansetron and Midazolam  Airway Management Planned: Simple Face Mask  Additional Equipment: None  Intra-op Plan:   Post-operative Plan:   Informed Consent: I have reviewed the patients History and Physical, chart, labs and discussed the procedure including the risks, benefits and alternatives for the proposed anesthesia with the patient or authorized representative who has indicated his/her understanding and acceptance.     Dental advisory given  Plan Discussed with: CRNA and Surgeon  Anesthesia Plan Comments: (See PAT note 03/04/19, Konrad Felix, PA-C)        Anesthesia Quick  Evaluation

## 2020-02-22 NOTE — Care Plan (Signed)
Ortho Bundle Case Management Note  Patient Details  Name: Ashley Mercer MRN: [id number] Date of Birth: 11/19/1940  L TKA on 02-22-20 DCP:  Home with dtr.  2 story home with 3 ste. DME:  No needs.  Has a RW and 3-in-1. PT:  [facility].  PT eval scheduled on 02-25-20.   DME Arranged:  N/A DME Agency:  NA  HH Arranged:  NA HH Agency:  NA  Additional Comments: Please contact me with any questions of if this plan should need to change.  Marianne Sofia, RN,CCM [facility]  [phone] 02/22/2020, 10:41 AM

## 2020-02-22 NOTE — Anesthesia Procedure Notes (Signed)
Spinal  Patient location during procedure: OR Start time: 02/22/2020 8:18 AM End time: 02/22/2020 8:23 AM Staffing Performed: anesthesiologist  Anesthesiologist: Lynda Rainwater, MD Preanesthetic Checklist Completed: patient identified, IV checked, site marked, risks and benefits discussed, surgical consent, monitors and equipment checked, pre-op evaluation and timeout performed Spinal Block Patient position: sitting Prep: DuraPrep Patient monitoring: heart rate, cardiac monitor, continuous pulse ox and blood pressure Approach: midline Location: L3-4 Injection technique: single-shot Needle Needle type: Quincke  Needle gauge: 22 G Needle length: 9 cm Assessment Sensory level: T4

## 2020-02-22 NOTE — Transfer of Care (Signed)
Immediate Anesthesia Transfer of Care Note  Patient: Ashley Mercer  Procedure(s) Performed: TOTAL KNEE ARTHROPLASTY (Left Knee)  Patient Location: PACU  Anesthesia Type:Spinal  Level of Consciousness: drowsy, patient cooperative and responds to stimulation  Airway & Oxygen Therapy: Patient Spontanous Breathing and Patient connected to nasal cannula oxygen  Post-op Assessment: Report given to RN and Post -op Vital signs reviewed and stable  Post vital signs: Reviewed  Last Vitals:  Vitals Value Taken Time  BP 92/70 02/22/20 1003  Temp    Pulse 58 02/22/20 1004  Resp 15 02/22/20 1004  SpO2 93 % 02/22/20 1004  Vitals shown include unvalidated device data.  Last Pain: There were no vitals filed for this visit.       Complications: No complications documented.

## 2020-02-22 NOTE — Anesthesia Postprocedure Evaluation (Signed)
Anesthesia Post Note  Patient: Ashley Mercer  Procedure(s) Performed: TOTAL KNEE ARTHROPLASTY (Left Knee)     Patient location during evaluation: PACU Anesthesia Type: Regional and Spinal Level of consciousness: awake and alert Pain management: pain level controlled Vital Signs Assessment: post-procedure vital signs reviewed and stable Respiratory status: spontaneous breathing, nonlabored ventilation and respiratory function stable Cardiovascular status: blood pressure returned to baseline and stable Postop Assessment: no apparent nausea or vomiting Anesthetic complications: no   No complications documented.  Last Vitals:  Vitals:   02/22/20 1115 02/22/20 1130  BP: 121/72 112/66  Pulse: (!) 58 (!) 57  Resp: (!) 21 16  Temp:    SpO2: 94% 92%    Last Pain:  Vitals:   02/22/20 1130  PainSc: 0-No pain                 Lynda Rainwater

## 2020-02-22 NOTE — Op Note (Signed)
OPERATIVE REPORT-TOTAL KNEE ARTHROPLASTY   Pre-operative diagnosis- Osteoarthritis  Left knee(s)  Post-operative diagnosis- Osteoarthritis Left knee(s)  Procedure-  Left  Total Knee Arthroplasty  Surgeon- Dione Plover. Pritesh Sobecki, MD  Assistant- Theresa Duty, PA-C   Anesthesia-  Adductor canal block and spinal  EBL- 25 ml   Drains None  Tourniquet time-  Total Tourniquet Time Documented: Thigh (Left) - 37 minutes Total: Thigh (Left) - 37 minutes     Complications- None  Condition-PACU - hemodynamically stable.   Brief Clinical Note  Ashley Mercer is a 80 y.o. year old female with end stage OA of her left knee with progressively worsening pain and dysfunction. She has constant pain, with activity and at rest and significant functional deficits with difficulties even with ADLs. She has had extensive non-op management including analgesics, injections of cortisone and viscosupplements, and home exercise program, but remains in significant pain with significant dysfunction. Radiographs show bone on bone arthritis medial and patellofemoral. She presents now for left Total Knee Arthroplasty.    Procedure in detail---   The patient is brought into the operating room and positioned supine on the operating table. After successful administration of  Adductor canal block and spinal,   a tourniquet is placed high on the  Left thigh(s) and the lower extremity is prepped and draped in the usual sterile fashion. Time out is performed by the operating team and then the  Left lower extremity is wrapped in Esmarch, knee flexed and the tourniquet inflated to 300 mmHg.       A midline incision is made with a ten blade through the subcutaneous tissue to the level of the extensor mechanism. A fresh blade is used to make a medial parapatellar arthrotomy. Soft tissue over the proximal medial tibia is subperiosteally elevated to the joint line with a knife and into the semimembranosus bursa with a Cobb  elevator. Soft tissue over the proximal lateral tibia is elevated with attention being paid to avoiding the patellar tendon on the tibial tubercle. The patella is everted, knee flexed 90 degrees and the ACL and PCL are removed. Findings are bone on bone medial and patellofemoral with large global osteophytes.        The drill is used to create a starting hole in the distal femur and the canal is thoroughly irrigated with sterile saline to remove the fatty contents. The 5 degree Left  valgus alignment guide is placed into the femoral canal and the distal femoral cutting block is pinned to remove 10 mm off the distal femur. Resection is made with an oscillating saw.      The tibia is subluxed forward and the menisci are removed. The extramedullary alignment guide is placed referencing proximally at the medial aspect of the tibial tubercle and distally along the second metatarsal axis and tibial crest. The block is pinned to remove 70mm off the more deficient medial  side. Resection is made with an oscillating saw. Size 2.5is the most appropriate size for the tibia and the proximal tibia is prepared with the modular drill and keel punch for that size.      The femoral sizing guide is placed and size 3 is most appropriate. Rotation is marked off the epicondylar axis and confirmed by creating a rectangular flexion gap at 90 degrees. The size 3 cutting block is pinned in this rotation and the anterior, posterior and chamfer cuts are made with the oscillating saw. The intercondylar block is then placed and that cut is made.  Trial size 2.5 tibial component, trial size 3 posterior stabilized femur and a 10  mm posterior stabilized rotating platform insert trial is placed. Full extension is achieved with excellent varus/valgus and anterior/posterior balance throughout full range of motion. The patella is everted and thickness measured to be 22  mm. Free hand resection is taken to 12 mm, a 35 template is placed, lug  holes are drilled, trial patella is placed, and it tracks normally. Osteophytes are removed off the posterior femur with the trial in place. All trials are removed and the cut bone surfaces prepared with pulsatile lavage. Cement is mixed and once ready for implantation, the size 2.5 tibial implant, size  3 posterior stabilized femoral component, and the size 35 patella are cemented in place and the patella is held with the clamp. The trial insert is placed and the knee held in full extension. The Exparel (20 ml mixed with 60 ml saline) is injected into the extensor mechanism, posterior capsule, medial and lateral gutters and subcutaneous tissues.  All extruded cement is removed and once the cement is hard the permanent 10 mm posterior stabilized rotating platform insert is placed into the tibial tray.      The wound is copiously irrigated with saline solution and the extensor mechanism closed with # 0 Stratofix suture. The tourniquet is released for a total tourniquet time of 37  minutes. Flexion against gravity is 140 degrees and the patella tracks normally. Subcutaneous tissue is closed with 2.0 vicryl and subcuticular with running 4.0 Monocryl. The incision is cleaned and dried and steri-strips and a bulky sterile dressing are applied. The limb is placed into a knee immobilizer and the patient is awakened and transported to recovery in stable condition.      Please note that a surgical assistant was a medical necessity for this procedure in order to perform it in a safe and expeditious manner. Surgical assistant was necessary to retract the ligaments and vital neurovascular structures to prevent injury to them and also necessary for proper positioning of the limb to allow for anatomic placement of the prosthesis.   Dione Plover Jakaya Jacobowitz, MD    02/22/2020, 9:26 AM

## 2020-02-22 NOTE — Anesthesia Procedure Notes (Signed)
Procedure Name: MAC Date/Time: 02/22/2020 8:15 AM Performed by: Michele Rockers, CRNA Pre-anesthesia Checklist: Patient identified, Emergency Drugs available, Suction available, Timeout performed and Patient being monitored Patient Re-evaluated:Patient Re-evaluated prior to induction Oxygen Delivery Method: Simple face mask

## 2020-02-22 NOTE — Anesthesia Procedure Notes (Signed)
Anesthesia Regional Block: Adductor canal block   Pre-Anesthetic Checklist: ,, timeout performed, Correct Patient, Correct Site, Correct Laterality, Correct Procedure, Correct Position, site marked, Risks and benefits discussed,  Surgical consent,  Pre-op evaluation,  At surgeon's request and post-op pain management  Laterality: Left  Prep: chloraprep       Needles:  Injection technique: Single-shot  Needle Type: Stimiplex     Needle Length: 9cm  Needle Gauge: 21     Additional Needles:   Procedures:,,,, ultrasound used (permanent image in chart),,,,  Narrative:  Start time: 02/22/2020 7:55 AM End time: 02/22/2020 8:00 AM Injection made incrementally with aspirations every 5 mL.  Performed by: Personally  Anesthesiologist: Lynda Rainwater, MD

## 2020-02-22 NOTE — Discharge Instructions (Signed)
 Ashley Aluisio, MD Total Joint Specialist [facility] [address] , [place] [address] [phone]  TOTAL KNEE REPLACEMENT POSTOPERATIVE DIRECTIONS    Knee Rehabilitation, Guidelines Following Surgery  Results after knee surgery are often greatly improved when you follow the exercise, range of motion and muscle strengthening exercises prescribed by your doctor. Safety measures are also important to protect the knee from further injury. If any of these exercises cause you to have increased pain or swelling in your knee joint, decrease the amount until you are comfortable again and slowly increase them. If you have problems or questions, call your caregiver or physical therapist for advice.   BLOOD CLOT PREVENTION . Take a 325 mg Aspirin two times a day for three weeks following surgery. Then resume one 81 mg Aspirin once a day. . You may resume your vitamins/supplements upon discharge from the hospital. . Do not take any NSAIDs (Advil, Aleve, Ibuprofen, Meloxicam, etc.) until you have discontinued the 325 mg Aspirin.  HOME CARE INSTRUCTIONS  . Remove items at home which could result in a fall. This includes throw rugs or furniture in walking pathways.  . ICE to the affected knee as much as tolerated. Icing helps control swelling. If the swelling is well controlled you will be more comfortable and rehab easier. Continue to use ice on the knee for pain and swelling from surgery. You may notice swelling that will progress down to the foot and ankle. This is normal after surgery. Elevate the leg when you are not up walking on it.    . Continue to use the breathing machine which will help keep your temperature down. It is common for your temperature to cycle up and down following surgery, especially at night when you are not up moving around and exerting yourself. The breathing machine keeps your lungs expanded and your temperature down. . Do not place pillow  under the operative knee, focus on keeping the knee straight while resting  DIET You may resume your previous home diet once you are discharged from the hospital.  DRESSING / WOUND CARE / SHOWERING . Keep your bulky bandage on for 2 days. On the third post-operative day you may remove the Ace bandage and gauze. There is a waterproof adhesive bandage on your skin which will stay in place until your first follow-up appointment. Once you remove this you will not need to place another bandage . You may begin showering 3 days following surgery, but do not submerge the incision under water.  ACTIVITY For the first 5 days, the key is rest and control of pain and swelling . Do your home exercises twice a day starting on post-operative day 3. On the days you go to physical therapy, just do the home exercises once that day. . You should rest, ice and elevate the leg for 50 minutes out of every hour. Get up and walk/stretch for 10 minutes per hour. After 5 days you can increase your activity slowly as tolerated. . Walk with your walker as instructed. Use the walker until you are comfortable transitioning to a cane. Walk with the cane in the opposite hand of the operative leg. You may discontinue the cane once you are comfortable and walking steadily. . Avoid periods of inactivity such as sitting longer than an hour when not asleep. This helps prevent blood clots.  . You may discontinue the knee immobilizer once you are able to perform a straight leg raise while lying down. .   You may resume a sexual relationship in one month or when given the OK by your doctor.  . You may return to work once you are cleared by your doctor.  . Do not drive a car for 6 weeks or until released by your surgeon.  . Do not drive while taking narcotics.  TED HOSE STOCKINGS Wear the elastic stockings on both legs for three weeks following surgery during the day. You may remove them at night for sleeping.  WEIGHT BEARING Weight  bearing as tolerated with assist device (walker, cane, etc) as directed, use it as long as suggested by your surgeon or therapist, typically at least 4-6 weeks.  POSTOPERATIVE CONSTIPATION PROTOCOL Constipation - defined medically as fewer than three stools per week and severe constipation as less than one stool per week.  One of the most common issues patients have following surgery is constipation.  Even if you have a regular bowel pattern at home, your normal regimen is likely to be disrupted due to multiple reasons following surgery.  Combination of anesthesia, postoperative narcotics, change in appetite and fluid intake all can affect your bowels.  In order to avoid complications following surgery, here are some recommendations in order to help you during your recovery period.  . Colace (docusate) - Pick up an over-the-counter form of Colace or another stool softener and take twice a day as long as you are requiring postoperative pain medications.  Take with a full glass of water daily.  If you experience loose stools or diarrhea, hold the colace until you stool forms back up. If your symptoms do not get better within 1 week or if they get worse, check with your doctor. . Dulcolax (bisacodyl) - Pick up over-the-counter and take as directed by the product packaging as needed to assist with the movement of your bowels.  Take with a full glass of water.  Use this product as needed if not relieved by Colace only.  . MiraLax (polyethylene glycol) - Pick up over-the-counter to have on hand. MiraLax is a solution that will increase the amount of water in your bowels to assist with bowel movements.  Take as directed and can mix with a glass of water, juice, soda, coffee, or tea. Take if you go more than two days without a movement. Do not use MiraLax more than once per day. Call your doctor if you are still constipated or irregular after using this medication for 7 days in a row.  If you continue to have  problems with postoperative constipation, please contact the office for further assistance and recommendations.  If you experience "the worst abdominal pain ever" or develop nausea or vomiting, please contact the office immediatly for further recommendations for treatment.  ITCHING If you experience itching with your medications, try taking only a single pain pill, or even half a pain pill at a time.  You can also use Benadryl over the counter for itching or also to help with sleep.   MEDICATIONS See your medication summary on the "After Visit Summary" that the nursing staff will review with you prior to discharge.  You may have some home medications which will be placed on hold until you complete the course of blood thinner medication.  It is important for you to complete the blood thinner medication as prescribed by your surgeon.  Continue your approved medications as instructed at time of discharge.  PRECAUTIONS . If you experience chest pain or shortness of breath - call 911 immediately for   transfer to the hospital emergency department.  . If you develop a fever greater that 101 F, purulent drainage from wound, increased redness or drainage from wound, foul odor from the wound/dressing, or calf pain - CONTACT YOUR SURGEON.                                                   FOLLOW-UP APPOINTMENTS Make sure you keep all of your appointments after your operation with your surgeon and caregivers. You should call the office at the above phone number and make an appointment for approximately two weeks after the date of your surgery or on the date instructed by your surgeon outlined in the "After Visit Summary".  RANGE OF MOTION AND STRENGTHENING EXERCISES  Rehabilitation of the knee is important following a knee injury or an operation. After just a few days of immobilization, the muscles of the thigh which control the knee become weakened and shrink (atrophy). Knee exercises are designed to build up the  tone and strength of the thigh muscles and to improve knee motion. Often times heat used for twenty to thirty minutes before working out will loosen up your tissues and help with improving the range of motion but do not use heat for the first two weeks following surgery. These exercises can be done on a training (exercise) mat, on the floor, on a table or on a bed. Use what ever works the best and is most comfortable for you Knee exercises include:  . Leg Lifts - While your knee is still immobilized in a splint or cast, you can do straight leg raises. Lift the leg to 60 degrees, hold for 3 sec, and slowly lower the leg. Repeat 10-20 times 2-3 times daily. Perform this exercise against resistance later as your knee gets better.  . Quad and Hamstring Sets - Tighten up the muscle on the front of the thigh (Quad) and hold for 5-10 sec. Repeat this 10-20 times hourly. Hamstring sets are done by pushing the foot backward against an object and holding for 5-10 sec. Repeat as with quad sets.   Leg Slides: Lying on your back, slowly slide your foot toward your buttocks, bending your knee up off the floor (only go as far as is comfortable). Then slowly slide your foot back down until your leg is flat on the floor again.  Angel Wings: Lying on your back spread your legs to the side as far apart as you can without causing discomfort.  A rehabilitation program following serious knee injuries can speed recovery and prevent re-injury in the future due to weakened muscles. Contact your doctor or a physical therapist for more information on knee rehabilitation.   IF YOU ARE TRANSFERRED TO A SKILLED REHAB FACILITY If the patient is transferred to a skilled rehab facility following release from the hospital, a list of the current medications will be sent to the facility for the patient to continue.  When discharged from the skilled rehab facility, please have the facility set up the patient's Home Health Physical Therapy  prior to being released. Also, the skilled facility will be responsible for providing the patient with their medications at time of release from the facility to include their pain medication, the muscle relaxants, and their blood thinner medication. If the patient is still at the rehab facility at time of the   two week follow up appointment, the skilled rehab facility will also need to assist the patient in arranging follow up appointment in our office and any transportation needs.  MAKE SURE YOU:  . Understand these instructions.  . Get help right away if you are not doing well or get worse.   DENTAL ANTIBIOTICS:  In most cases prophylactic antibiotics for Dental procdeures after total joint surgery are not necessary.  Exceptions are as follows:  1. History of prior total joint infection  2. Severely immunocompromised (Organ Transplant, cancer chemotherapy, Rheumatoid biologic meds such as Humera)  3. Poorly controlled diabetes (A1C &gt; 8.0, blood glucose over 200)  If you have one of these conditions, contact your surgeon for an antibiotic prescription, prior to your dental procedure.    Pick up stool softner and laxative for home use following surgery while on pain medications. Do not submerge incision under water. Please use good hand washing techniques while changing dressing each day. May shower starting three days after surgery. Please use a clean towel to pat the incision dry following showers. Continue to use ice for pain and swelling after surgery. Do not use any lotions or creams on the incision until instructed by your surgeon.  

## 2020-02-22 NOTE — Interval H&P Note (Signed)
History and Physical Interval Note:  02/22/2020 6:31 AM  Ashley Mercer  has presented today for surgery, with the diagnosis of left knee osteoarthritis.  The various methods of treatment have been discussed with the patient and family. After consideration of risks, benefits and other options for treatment, the patient has consented to  Procedure(s) with comments: TOTAL KNEE ARTHROPLASTY (Left) - 53min as a surgical intervention.  The patient's history has been reviewed, patient examined, no change in status, stable for surgery.  I have reviewed the patient's chart and labs.  Questions were answered to the patient's satisfaction.     Pilar Plate Neko Mcgeehan

## 2020-02-22 NOTE — Progress Notes (Signed)
Assisted Dr. Miller with left, ultrasound guided, adductor canal block. Side rails up, monitors on throughout procedure. See vital signs in flow sheet. Tolerated Procedure well.  

## 2020-02-22 NOTE — Evaluation (Signed)
Physical Therapy Evaluation Patient Details Name: Ashley Mercer MRN: [id number] DOB: 06/19/1940 Today's Date: 02/22/2020   History of Present Illness  patient is an 80 y.o. female s/p Lt TKA on 02/22/2020 with PMH significant for hypothyroidism, HTN, high cholestrol, CKD, chronic bakc pain, cervical and lumbar stenosis, anxiety, anemia, Rt TKA (2021), lumbar surgery, ACDF (2021), and lumbar laminectomy (2019).  Clinical Impression  Pt is an 80 y.o. female s/p Lt TKA POD 0. Pt reports that she is modified independent with use of SPC for mobility at baseline. Pt required MIN guard and verbal cues for safe hand placement with sit to stand transfers. Pt required MIN guard-supervision for ambulation 58ft with verbal cues for RW management and step to gait pattern with no LOB. Pt was able to safely perform stair negotiation with 1 rail and SPC and MIN assist for safety with RW management and cues for sequencing. PT reviewed safe guarding position for family with pt and her granddaughter. Pt will be staying with her granddaughter and other relatives while recovering. Pt is at a safe mobility for return home with supervision. Recommend home with family support. Pt will benefit from skilled PT to increase independence and safety with mobility.      Follow Up Recommendations Follow surgeon's recommendation for DC plan and follow-up therapies;Outpatient PT    Equipment Recommendations  None recommended by PT (pt owns RW)    Recommendations for Other Services       Precautions / Restrictions Precautions Precautions: Fall Restrictions Weight Bearing Restrictions: No Other Position/Activity Restrictions: WBAT      Mobility  Bed Mobility Overal bed mobility: Needs Assistance Bed Mobility: Supine to Sit     Supine to sit: Supervision;Min assist     General bed mobility comments: able to progress LEs off bed with supervision for safety. Pt required MIN assist with progression of LEs into bed.     Transfers Overall transfer level: Needs assistance Equipment used: Rolling walker (2 wheeled) Transfers: Sit to/from Stand Sit to Stand: Min guard         General transfer comment: MIN guard for safety and cues for hand placement with sit to stand from EOB and toilet with use of grab bar  Ambulation/Gait Ambulation/Gait assistance: Min guard Gait Distance (Feet): 90 Feet Assistive device: Rolling walker (2 wheeled) Gait Pattern/deviations: Step-to pattern;Decreased stride length;Decreased weight shift to left Gait velocity: decr   General Gait Details: pt able to perform pre gait marching with MIN guard and no LOB or knee buckling. MIN guard for safety and cues for step to gait pattern and to [facility] RW on the floor, no LOB observed.  Stairs Stairs: Yes Stairs assistance: Min assist Stair Management: One rail Left;With cane;Forwards Number of Stairs: 3 General stair comments: pt with MIN assist for RW managament and safety with cues for sequencing for safe technique. PT educated pt and granddaughter on safe guarding position for family members at home and "up with the good, down with the bad"  technique for safety.  Wheelchair Mobility    Modified Rankin (Stroke Patients Only)       Balance Overall balance assessment: Needs assistance Sitting-balance support: Feet supported;Single extremity supported Sitting balance-Leahy Scale: Fair     Standing balance support: Bilateral upper extremity supported;During functional activity Standing balance-Leahy Scale: Poor Standing balance comment: use of external support  Pertinent Vitals/Pain Pain Assessment: 0-10 Pain Score: 0-No pain Pain Location: Lt knee Pain Intervention(s): Limited activity within patient's tolerance;Monitored during session;Repositioned    Home Living Family/patient expects to be discharged to:: Private residence Living Arrangements: Children;Other  relatives Available Help at Discharge: Family;Available 24 hours/day Type of Home: House Home Access: Stairs to enter Entrance Stairs-Rails: Left;Right (cant reach both) Entrance Stairs-Number of Steps: 2 Home Layout: Two level;Able to live on main level with bedroom/bathroom Home Equipment: Grab bars - toilet;Shower seat;Walker - 2 wheels;Cane - single point Additional Comments: living with daughter and other fmaily availble to asisst 24/7    Prior Function Level of Independence: Independent with assistive device(s)         Comments: use of SPC     Hand Dominance   Dominant Hand: Right    Extremity/Trunk Assessment   Upper Extremity Assessment Upper Extremity Assessment: Overall WFL for tasks assessed    Lower Extremity Assessment Lower Extremity Assessment: LLE deficits/detail LLE Deficits / Details: pt with 4/5 Lt quad set strength and B dorsi/plantar flexion strength LLE Sensation: WNL LLE Coordination: WNL    Cervical / Trunk Assessment Cervical / Trunk Assessment: Kyphotic  Communication   Communication: No difficulties  Cognition Arousal/Alertness: Awake/alert Behavior During Therapy: WFL for tasks assessed/performed Overall Cognitive Status: Within Functional Limits for tasks assessed                                        General Comments      Exercises Total Joint Exercises Ankle Circles/Pumps: AROM;Both;15 reps;Supine Quad Sets: AROM;Left;5 reps;Supine Short Arc Quad: AROM;Left;5 reps;Supine Heel Slides: AROM;Left;5 reps;Supine Hip ABduction/ADduction: AROM;Left;5 reps;Supine Straight Leg Raises: AROM;Left;5 reps;Seated   Assessment/Plan    PT Assessment Patient needs continued PT services  PT Problem List Decreased activity tolerance;Decreased range of motion;Decreased strength;Decreased balance;Decreased mobility;Decreased knowledge of use of DME;Pain;Decreased safety awareness       PT Treatment Interventions DME  instruction;Gait training;Stair training;Functional mobility training;Therapeutic activities;Therapeutic exercise;Balance training;Patient/family education    PT Goals (Current goals can be found in the Care Plan section)  Acute Rehab PT Goals Patient Stated Goal: Regain independence and improved mobility PT Goal Formulation: With patient/family Time For Goal Achievement: 02/29/20 Potential to Achieve Goals: Good    Frequency 7X/week   Barriers to discharge        Co-evaluation               AM-PAC PT "6 Clicks" Mobility  Outcome Measure Help needed turning from your back to your side while in a flat bed without using bedrails?: None Help needed moving from lying on your back to sitting on the side of a flat bed without using bedrails?: None Help needed moving to and from a bed to a chair (including a wheelchair)?: A Little Help needed standing up from a chair using your arms (e.g., wheelchair or bedside chair)?: A Little Help needed to walk in hospital room?: A Little Help needed climbing 3-5 steps with a railing? : A Little 6 Click Score: 20    End of Session Equipment Utilized During Treatment: Gait belt Activity Tolerance: Patient tolerated treatment well Patient left: in bed;with call bell/phone within reach;with family/visitor present Nurse Communication: Mobility status PT Visit Diagnosis: Unsteadiness on feet (R26.81);Muscle weakness (generalized) (M62.81);Pain Pain - Right/Left: Left Pain - part of body: Knee    Time: 1325-1414 PT Time Calculation (min) (ACUTE ONLY): 49  min   Charges:              Elna Breslow, SPT  Acute rehab    Elna Breslow 02/22/2020, 2:34 PM

## 2020-02-23 ENCOUNTER — Encounter (HOSPITAL_COMMUNITY): Payer: Self-pay | Admitting: Orthopedic Surgery

## 2020-02-25 DIAGNOSIS — M25562 Pain in left knee: Secondary | ICD-10-CM | POA: Diagnosis not present

## 2020-02-29 DIAGNOSIS — M25562 Pain in left knee: Secondary | ICD-10-CM | POA: Diagnosis not present

## 2020-03-02 ENCOUNTER — Encounter (HOSPITAL_COMMUNITY): Payer: Self-pay | Admitting: Orthopedic Surgery

## 2020-03-02 NOTE — Addendum Note (Signed)
Addendum  created 03/02/20 1220 by Lynda Rainwater, MD   Intraprocedure Staff edited

## 2020-03-07 DIAGNOSIS — M25562 Pain in left knee: Secondary | ICD-10-CM | POA: Diagnosis not present

## 2020-03-09 DIAGNOSIS — M25562 Pain in left knee: Secondary | ICD-10-CM | POA: Diagnosis not present

## 2020-03-11 DIAGNOSIS — M25562 Pain in left knee: Secondary | ICD-10-CM | POA: Diagnosis not present

## 2020-03-14 DIAGNOSIS — M25562 Pain in left knee: Secondary | ICD-10-CM | POA: Diagnosis not present

## 2020-03-16 DIAGNOSIS — M25562 Pain in left knee: Secondary | ICD-10-CM | POA: Diagnosis not present

## 2020-03-21 DIAGNOSIS — M25562 Pain in left knee: Secondary | ICD-10-CM | POA: Diagnosis not present

## 2020-03-23 DIAGNOSIS — M25562 Pain in left knee: Secondary | ICD-10-CM | POA: Diagnosis not present

## 2020-03-25 DIAGNOSIS — M25562 Pain in left knee: Secondary | ICD-10-CM | POA: Diagnosis not present

## 2020-03-28 DIAGNOSIS — M25562 Pain in left knee: Secondary | ICD-10-CM | POA: Diagnosis not present

## 2020-03-29 DIAGNOSIS — Z96651 Presence of right artificial knee joint: Secondary | ICD-10-CM | POA: Diagnosis not present

## 2020-03-29 DIAGNOSIS — Z471 Aftercare following joint replacement surgery: Secondary | ICD-10-CM | POA: Diagnosis not present

## 2020-04-01 DIAGNOSIS — Z1231 Encounter for screening mammogram for malignant neoplasm of breast: Secondary | ICD-10-CM | POA: Diagnosis not present

## 2020-04-04 DIAGNOSIS — M25562 Pain in left knee: Secondary | ICD-10-CM | POA: Diagnosis not present

## 2020-04-08 DIAGNOSIS — M25562 Pain in left knee: Secondary | ICD-10-CM | POA: Diagnosis not present

## 2020-04-12 DIAGNOSIS — M25562 Pain in left knee: Secondary | ICD-10-CM | POA: Diagnosis not present

## 2020-04-20 DIAGNOSIS — M25562 Pain in left knee: Secondary | ICD-10-CM | POA: Diagnosis not present

## 2020-04-22 DIAGNOSIS — M25562 Pain in left knee: Secondary | ICD-10-CM | POA: Diagnosis not present

## 2020-04-25 DIAGNOSIS — M25562 Pain in left knee: Secondary | ICD-10-CM | POA: Diagnosis not present

## 2020-04-27 DIAGNOSIS — M25562 Pain in left knee: Secondary | ICD-10-CM | POA: Diagnosis not present

## 2020-04-29 DIAGNOSIS — M25562 Pain in left knee: Secondary | ICD-10-CM | POA: Diagnosis not present

## 2020-05-02 DIAGNOSIS — M25562 Pain in left knee: Secondary | ICD-10-CM | POA: Diagnosis not present

## 2020-05-06 DIAGNOSIS — M25562 Pain in left knee: Secondary | ICD-10-CM | POA: Diagnosis not present

## 2020-05-13 DIAGNOSIS — I10 Essential (primary) hypertension: Secondary | ICD-10-CM | POA: Diagnosis not present

## 2020-05-13 DIAGNOSIS — K219 Gastro-esophageal reflux disease without esophagitis: Secondary | ICD-10-CM | POA: Diagnosis not present

## 2020-05-13 DIAGNOSIS — E7800 Pure hypercholesterolemia, unspecified: Secondary | ICD-10-CM | POA: Diagnosis not present

## 2020-05-13 DIAGNOSIS — E782 Mixed hyperlipidemia: Secondary | ICD-10-CM | POA: Diagnosis not present

## 2020-05-13 DIAGNOSIS — E039 Hypothyroidism, unspecified: Secondary | ICD-10-CM | POA: Diagnosis not present

## 2020-05-23 ENCOUNTER — Other Ambulatory Visit: Payer: Self-pay | Admitting: Family Medicine

## 2020-05-23 DIAGNOSIS — E2839 Other primary ovarian failure: Secondary | ICD-10-CM

## 2020-06-09 DIAGNOSIS — R809 Proteinuria, unspecified: Secondary | ICD-10-CM | POA: Diagnosis not present

## 2020-06-09 DIAGNOSIS — I129 Hypertensive chronic kidney disease with stage 1 through stage 4 chronic kidney disease, or unspecified chronic kidney disease: Secondary | ICD-10-CM | POA: Diagnosis not present

## 2020-06-09 DIAGNOSIS — E669 Obesity, unspecified: Secondary | ICD-10-CM | POA: Diagnosis not present

## 2020-07-13 DIAGNOSIS — R7303 Prediabetes: Secondary | ICD-10-CM | POA: Diagnosis not present

## 2020-07-13 DIAGNOSIS — E2839 Other primary ovarian failure: Secondary | ICD-10-CM | POA: Diagnosis not present

## 2020-07-13 DIAGNOSIS — E039 Hypothyroidism, unspecified: Secondary | ICD-10-CM | POA: Diagnosis not present

## 2020-07-13 DIAGNOSIS — R7309 Other abnormal glucose: Secondary | ICD-10-CM | POA: Diagnosis not present

## 2020-07-13 DIAGNOSIS — M545 Low back pain, unspecified: Secondary | ICD-10-CM | POA: Diagnosis not present

## 2020-07-13 DIAGNOSIS — E782 Mixed hyperlipidemia: Secondary | ICD-10-CM | POA: Diagnosis not present

## 2020-07-13 DIAGNOSIS — Z1389 Encounter for screening for other disorder: Secondary | ICD-10-CM | POA: Diagnosis not present

## 2020-07-13 DIAGNOSIS — Z0000 Encounter for general adult medical examination without abnormal findings: Secondary | ICD-10-CM | POA: Diagnosis not present

## 2020-07-13 DIAGNOSIS — K219 Gastro-esophageal reflux disease without esophagitis: Secondary | ICD-10-CM | POA: Diagnosis not present

## 2020-07-13 DIAGNOSIS — R202 Paresthesia of skin: Secondary | ICD-10-CM | POA: Diagnosis not present

## 2020-07-13 DIAGNOSIS — I10 Essential (primary) hypertension: Secondary | ICD-10-CM | POA: Diagnosis not present

## 2020-07-13 DIAGNOSIS — M8588 Other specified disorders of bone density and structure, other site: Secondary | ICD-10-CM | POA: Diagnosis not present

## 2020-07-13 DIAGNOSIS — R809 Proteinuria, unspecified: Secondary | ICD-10-CM | POA: Diagnosis not present

## 2020-07-26 DIAGNOSIS — H2513 Age-related nuclear cataract, bilateral: Secondary | ICD-10-CM | POA: Diagnosis not present

## 2020-08-16 DIAGNOSIS — R030 Elevated blood-pressure reading, without diagnosis of hypertension: Secondary | ICD-10-CM | POA: Diagnosis not present

## 2020-08-16 DIAGNOSIS — M4712 Other spondylosis with myelopathy, cervical region: Secondary | ICD-10-CM | POA: Diagnosis not present

## 2020-08-16 DIAGNOSIS — Z6836 Body mass index (BMI) 36.0-36.9, adult: Secondary | ICD-10-CM | POA: Diagnosis not present

## 2020-08-16 DIAGNOSIS — M48062 Spinal stenosis, lumbar region with neurogenic claudication: Secondary | ICD-10-CM | POA: Diagnosis not present

## 2020-08-17 ENCOUNTER — Other Ambulatory Visit: Payer: Self-pay | Admitting: Neurosurgery

## 2020-08-17 DIAGNOSIS — M4712 Other spondylosis with myelopathy, cervical region: Secondary | ICD-10-CM

## 2020-08-17 DIAGNOSIS — M48062 Spinal stenosis, lumbar region with neurogenic claudication: Secondary | ICD-10-CM

## 2020-09-04 ENCOUNTER — Ambulatory Visit
Admission: RE | Admit: 2020-09-04 | Discharge: 2020-09-04 | Disposition: A | Discharge status: Home / Self Care | Payer: Medicare Other | Source: Ambulatory Visit | Attending: Neurosurgery | Admitting: Neurosurgery

## 2020-09-04 ENCOUNTER — Other Ambulatory Visit: Payer: Self-pay

## 2020-09-04 DIAGNOSIS — M4807 Spinal stenosis, lumbosacral region: Secondary | ICD-10-CM | POA: Diagnosis not present

## 2020-09-04 DIAGNOSIS — M542 Cervicalgia: Secondary | ICD-10-CM | POA: Diagnosis not present

## 2020-09-04 DIAGNOSIS — M4712 Other spondylosis with myelopathy, cervical region: Secondary | ICD-10-CM

## 2020-09-04 DIAGNOSIS — M48062 Spinal stenosis, lumbar region with neurogenic claudication: Secondary | ICD-10-CM | POA: Diagnosis not present

## 2020-09-04 DIAGNOSIS — M4802 Spinal stenosis, cervical region: Secondary | ICD-10-CM | POA: Diagnosis not present

## 2020-09-04 DIAGNOSIS — M5126 Other intervertebral disc displacement, lumbar region: Secondary | ICD-10-CM | POA: Diagnosis not present

## 2020-09-04 DIAGNOSIS — M5127 Other intervertebral disc displacement, lumbosacral region: Secondary | ICD-10-CM | POA: Diagnosis not present

## 2020-09-04 MED ORDER — GADOBENATE DIMEGLUMINE 529 MG/ML IV SOLN
18.0000 mL | Freq: Once | INTRAVENOUS | Status: AC | PRN
  Administered 2020-09-04: 18 mL via INTRAVENOUS

## 2020-09-08 DIAGNOSIS — M48062 Spinal stenosis, lumbar region with neurogenic claudication: Secondary | ICD-10-CM | POA: Diagnosis not present

## 2020-09-14 ENCOUNTER — Other Ambulatory Visit: Payer: Self-pay | Admitting: Neurosurgery

## 2020-09-20 DIAGNOSIS — H18413 Arcus senilis, bilateral: Secondary | ICD-10-CM | POA: Diagnosis not present

## 2020-09-20 DIAGNOSIS — Z961 Presence of intraocular lens: Secondary | ICD-10-CM | POA: Diagnosis not present

## 2020-09-20 DIAGNOSIS — H26491 Other secondary cataract, right eye: Secondary | ICD-10-CM | POA: Diagnosis not present

## 2020-09-20 DIAGNOSIS — H02831 Dermatochalasis of right upper eyelid: Secondary | ICD-10-CM | POA: Diagnosis not present

## 2020-09-23 NOTE — Progress Notes (Signed)
Left message with [facility] surgical scheduler for Dr. Christella Noa. Asked them to have Dr. Christella Noa sign his orders that were in second sign.

## 2020-09-23 NOTE — Progress Notes (Signed)
Surgical Instructions    Your procedure is scheduled on Wednesday September 14th.  Report to [facility] Main Entrance "A" at 6:30 A.M., then check in with the Admitting office.  Call this number if you have problems the morning of surgery:  [phone]   If you have any questions prior to your surgery date call [phone]: Open Monday-Friday 8am-4pm    Remember:  Do not eat after midnight the night before your surgery  You may drink clear liquids until 5:30am the morning of your surgery.   Clear liquids allowed are: Water, Non-Citrus Juices (without pulp), Carbonated Beverages, Clear Tea, Black Coffee ONLY (NO MILK, CREAM OR POWDERED CREAMER of any kind), and Gatorade    Take these medicines the morning of surgery with A SIP OF WATER amLODipine (NORVASC) 5 MG tablet gemfibrozil (LOPID) 600 MG tablet levothyroxine (SYNTHROID, LEVOTHROID) 100 MCG tablet metoprolol succinate (TOPROL-XL) 100 MG 24 hr tablet prednisoLONE acetate (PRED FORTE) 1 % ophthalmic suspension pregabalin (LYRICA) 100 MG capsule raloxifene (EVISTA) 60 MG tablet  IF NEEDED  acetaminophen (TYLENOL) 500 MG tablet cyclobenzaprine (FLEXERIL) 10 MG tablet famotidine (PEPCID) 40 MG tablet   Follow your surgeon's instructions on when to stop Aspirin.  If no instructions were given by your surgeon then you will need to call the office to get those instructions.    As of today, STOP taking any Aspirin (unless otherwise instructed by your surgeon) Aleve, Naproxen, Ibuprofen, Motrin, Advil, Goody's, BC's, all herbal medications, fish oil, and all vitamins.          Do not wear jewelry or makeup Do not wear lotions, powders, perfumes, or deodorant. Do not shave 48 hours prior to surgery.   Do not bring valuables to the hospital. DO Not wear nail polish, gel polish, artificial nails, or any other type of covering on natural nails including finger and toenails. If patients have artificial nails, gel coating, etc. that  need to be removed by a nail salon please have this removed prior to surgery or surgery may need to be canceled/delayed if the surgeon/ anesthesia feels like the patient is unable to be adequately monitored.             [facility] is not responsible for any belongings or valuables.  Do NOT Smoke (Tobacco/Vaping)  24 hours prior to your procedure If you use a CPAP at night, you may bring your mask for your overnight stay.   Contacts, glasses, dentures or bridgework may not be worn into surgery, please bring cases for these belongings   For patients admitted to the hospital, discharge time will be determined by your treatment team.   Patients discharged the day of surgery will not be allowed to drive home, and someone needs to stay with them for 24 hours.  ONLY 1 SUPPORT PERSON MAY BE PRESENT WHILE YOU ARE IN SURGERY. IF YOU ARE TO BE ADMITTED ONCE YOU ARE IN YOUR ROOM YOU WILL BE ALLOWED TWO (2) VISITORS.  Minor children may have two parents present. Special consideration for safety and communication needs will be reviewed on a case by case basis.  Special instructions:    Oral Hygiene is also important to reduce your risk of infection.  Remember - BRUSH YOUR TEETH THE MORNING OF SURGERY WITH YOUR REGULAR TOOTHPASTE   - Preparing For Surgery  Before surgery, you can play an important role. Because skin is not sterile, your skin needs to be as free of germs as possible. You can reduce the  number of germs on your skin by washing with CHG (chlorahexidine gluconate) Soap before surgery.  CHG is an antiseptic cleaner which kills germs and bonds with the skin to continue killing germs even after washing.     Please do not use if you have an allergy to CHG or antibacterial soaps. If your skin becomes reddened/irritated stop using the CHG.  Do not shave (including legs and underarms) for at least 48 hours prior to first CHG shower. It is OK to shave your face.  Please follow these  instructions carefully.     Shower the NIGHT BEFORE SURGERY and the MORNING OF SURGERY with CHG Soap.   If you chose to wash your hair, wash your hair first as usual with your normal shampoo. After you shampoo, rinse your hair and body thoroughly to remove the shampoo.  Then [employer] and genitals (private parts) with your normal soap and rinse thoroughly to remove soap.  After that Use CHG Soap as you would any other liquid soap. You can apply CHG directly to the skin and wash gently with a scrungie or a clean washcloth.   Apply the CHG Soap to your body ONLY FROM THE NECK DOWN.  Do not use on open wounds or open sores. Avoid contact with your eyes, ears, mouth and genitals (private parts). Wash Face and genitals (private parts)  with your normal soap.   Wash thoroughly, paying special attention to the area where your surgery will be performed.  Thoroughly rinse your body with warm water from the neck down.  DO NOT shower/wash with your normal soap after using and rinsing off the CHG Soap.  Pat yourself dry with a CLEAN TOWEL.  Wear CLEAN PAJAMAS to bed the night before surgery  Place CLEAN SHEETS on your bed the night before your surgery  DO NOT SLEEP WITH PETS.   Day of Surgery:  Take a shower with CHG soap. Wear Clean/Comfortable clothing the morning of surgery Do not apply any deodorants/lotions.   Remember to brush your teeth WITH YOUR REGULAR TOOTHPASTE.   Please read over the following fact sheets that you were given.

## 2020-09-26 ENCOUNTER — Other Ambulatory Visit: Payer: Self-pay

## 2020-09-26 ENCOUNTER — Encounter (HOSPITAL_COMMUNITY)
Admission: RE | Admit: 2020-09-26 | Discharge: 2020-09-26 | Disposition: A | Discharge status: Home / Self Care | Payer: Medicare Other | Source: Ambulatory Visit | Attending: Neurosurgery | Admitting: Neurosurgery

## 2020-09-26 ENCOUNTER — Encounter (HOSPITAL_COMMUNITY): Payer: Self-pay

## 2020-09-26 DIAGNOSIS — Z20822 Contact with and (suspected) exposure to covid-19: Secondary | ICD-10-CM | POA: Insufficient documentation

## 2020-09-26 DIAGNOSIS — Z96652 Presence of left artificial knee joint: Secondary | ICD-10-CM | POA: Diagnosis not present

## 2020-09-26 DIAGNOSIS — I447 Left bundle-branch block, unspecified: Secondary | ICD-10-CM | POA: Insufficient documentation

## 2020-09-26 DIAGNOSIS — Z01818 Encounter for other preprocedural examination: Secondary | ICD-10-CM | POA: Insufficient documentation

## 2020-09-26 DIAGNOSIS — M48062 Spinal stenosis, lumbar region with neurogenic claudication: Secondary | ICD-10-CM | POA: Diagnosis not present

## 2020-09-26 DIAGNOSIS — M4807 Spinal stenosis, lumbosacral region: Secondary | ICD-10-CM | POA: Diagnosis not present

## 2020-09-26 DIAGNOSIS — Z981 Arthrodesis status: Secondary | ICD-10-CM | POA: Diagnosis not present

## 2020-09-26 LAB — CBC
HCT: 44.5 % (ref 36.0–46.0)
Hemoglobin: 14.1 g/dL (ref 12.0–15.0)
MCH: 26.8 pg (ref 26.0–34.0)
MCHC: 31.7 g/dL (ref 30.0–36.0)
MCV: 84.6 fL (ref 80.0–100.0)
Platelets: 308 10*3/uL (ref 150–400)
RBC: 5.26 MIL/uL — ABNORMAL HIGH (ref 3.87–5.11)
RDW: 14.9 % (ref 11.5–15.5)
WBC: 11.9 10*3/uL — ABNORMAL HIGH (ref 4.0–10.5)
nRBC: 0 % (ref 0.0–0.2)

## 2020-09-26 LAB — BASIC METABOLIC PANEL
Anion gap: 7 (ref 5–15)
BUN: 13 mg/dL (ref 8–23)
CO2: 29 mmol/L (ref 22–32)
Calcium: 9.5 mg/dL (ref 8.9–10.3)
Chloride: 104 mmol/L (ref 98–111)
Creatinine, Ser: 0.83 mg/dL (ref 0.44–1.00)
GFR, Estimated: >60 mL/min (ref >60)
Glucose, Bld: 111 mg/dL — ABNORMAL HIGH (ref 70–99)
Potassium: 3.6 mmol/L (ref 3.5–5.1)
Sodium: 140 mmol/L (ref 135–145)

## 2020-09-26 LAB — SARS CORONAVIRUS 2 (TAT 6-24 HRS): SARS Coronavirus 2: NEGATIVE

## 2020-09-26 LAB — TYPE AND SCREEN
ABO/RH(D): A NEG
Antibody Screen: NEGATIVE

## 2020-09-26 LAB — SURGICAL PCR SCREEN
MRSA, PCR: NEGATIVE
Staphylococcus aureus: NEGATIVE

## 2020-09-26 NOTE — Progress Notes (Signed)
PCP - Dr. Maury Dus Cardiologist - Denies  Chest x-ray - Not indicated EKG - 09/26/20 Stress Test - Denies ECHO - Denies Cardiac Cath - Denies  Sleep Study - Denies  Denies DM   Aspirin Instructions: Dr. Christella Noa instructed to stop 7 days prior.   ERAS [facility]col -  COVID TEST- 09/26/20  Anesthesia review: No  Patient denies shortness of breath, fever, cough and chest pain at PAT appointment   All instructions explained to the patient, with a verbal understanding of the material. Patient agrees to go over the instructions while at home for a better understanding. Patient also instructed to wear a mask while in public after being tested for COVID-19. The opportunity to ask questions was provided.

## 2020-09-28 ENCOUNTER — Inpatient Hospital Stay (HOSPITAL_COMMUNITY)
Admission: RE | Admit: 2020-09-28 | Discharge: 2020-09-30 | DRG: 455 | Disposition: A | Discharge status: Home / Self Care | Payer: Medicare Other | Attending: Neurosurgery | Admitting: Neurosurgery

## 2020-09-28 ENCOUNTER — Other Ambulatory Visit: Payer: Self-pay

## 2020-09-28 ENCOUNTER — Inpatient Hospital Stay (HOSPITAL_COMMUNITY): Payer: Medicare Other | Admitting: Certified Registered Nurse Anesthetist

## 2020-09-28 ENCOUNTER — Encounter (HOSPITAL_COMMUNITY)
Admission: RE | Disposition: A | Discharge status: Home / Self Care | Payer: Self-pay | Source: Home / Self Care | Attending: Neurosurgery

## 2020-09-28 ENCOUNTER — Encounter (HOSPITAL_COMMUNITY): Payer: Self-pay | Admitting: Neurosurgery

## 2020-09-28 ENCOUNTER — Inpatient Hospital Stay (HOSPITAL_COMMUNITY): Payer: Medicare Other

## 2020-09-28 DIAGNOSIS — Z96652 Presence of left artificial knee joint: Secondary | ICD-10-CM | POA: Diagnosis not present

## 2020-09-28 DIAGNOSIS — Z20822 Contact with and (suspected) exposure to covid-19: Secondary | ICD-10-CM | POA: Diagnosis present

## 2020-09-28 DIAGNOSIS — Z981 Arthrodesis status: Secondary | ICD-10-CM | POA: Diagnosis not present

## 2020-09-28 DIAGNOSIS — M4316 Spondylolisthesis, lumbar region: Secondary | ICD-10-CM | POA: Diagnosis not present

## 2020-09-28 DIAGNOSIS — M4326 Fusion of spine, lumbar region: Secondary | ICD-10-CM | POA: Diagnosis not present

## 2020-09-28 DIAGNOSIS — M47817 Spondylosis without myelopathy or radiculopathy, lumbosacral region: Secondary | ICD-10-CM | POA: Diagnosis not present

## 2020-09-28 DIAGNOSIS — Z419 Encounter for procedure for purposes other than remedying health state, unspecified: Secondary | ICD-10-CM

## 2020-09-28 DIAGNOSIS — M4807 Spinal stenosis, lumbosacral region: Secondary | ICD-10-CM | POA: Diagnosis not present

## 2020-09-28 DIAGNOSIS — M48062 Spinal stenosis, lumbar region with neurogenic claudication: Secondary | ICD-10-CM | POA: Diagnosis not present

## 2020-09-28 DIAGNOSIS — K219 Gastro-esophageal reflux disease without esophagitis: Secondary | ICD-10-CM | POA: Diagnosis not present

## 2020-09-28 DIAGNOSIS — M4317 Spondylolisthesis, lumbosacral region: Secondary | ICD-10-CM | POA: Diagnosis not present

## 2020-09-28 SURGERY — POSTERIOR LUMBAR FUSION 1 LEVEL
Anesthesia: General

## 2020-09-28 MED ORDER — BUPIVACAINE HCL (PF) 0.5 % IJ SOLN
INTRAMUSCULAR | Status: AC
  Filled 2020-09-28: qty 30

## 2020-09-28 MED ORDER — OXYCODONE HCL 5 MG PO TABS: 5.0000 mg | ORAL_TABLET | Freq: Once | ORAL | Status: DC | PRN

## 2020-09-28 MED ORDER — BUPIVACAINE HCL (PF) 0.5 % IJ SOLN
INTRAMUSCULAR | Status: DC | PRN
  Administered 2020-09-28: 10 mL

## 2020-09-28 MED ORDER — HYDROCHLOROTHIAZIDE 25 MG PO TABS
25.0000 mg | ORAL_TABLET | Freq: Every day | ORAL | Status: DC
  Administered 2020-09-29: 25 mg via ORAL
  Filled 2020-09-28: qty 1

## 2020-09-28 MED ORDER — CEFAZOLIN SODIUM-DEXTROSE 2-4 GM/100ML-% IV SOLN: 2.0000 g | INTRAVENOUS | Status: DC

## 2020-09-28 MED ORDER — PHENYLEPHRINE 40 MCG/ML (10ML) SYRINGE FOR IV PUSH (FOR BLOOD PRESSURE SUPPORT)
PREFILLED_SYRINGE | INTRAVENOUS | Status: AC
  Filled 2020-09-28: qty 10

## 2020-09-28 MED ORDER — DOCUSATE SODIUM 100 MG PO CAPS
100.0000 mg | ORAL_CAPSULE | Freq: Two times a day (BID) | ORAL | Status: DC
  Administered 2020-09-28 – 2020-09-29 (×3): 100 mg via ORAL
  Filled 2020-09-28 (×3): qty 1

## 2020-09-28 MED ORDER — QUINAPRIL-HYDROCHLOROTHIAZIDE 20-25 MG PO TABS: 1.0000 | ORAL_TABLET | Freq: Every day | ORAL | Status: DC

## 2020-09-28 MED ORDER — LIDOCAINE 2% (20 MG/ML) 5 ML SYRINGE
INTRAMUSCULAR | Status: AC
  Filled 2020-09-28: qty 5

## 2020-09-28 MED ORDER — ACETAMINOPHEN 325 MG PO TABS
650.0000 mg | ORAL_TABLET | ORAL | Status: DC | PRN
  Administered 2020-09-29 – 2020-09-30 (×4): 650 mg via ORAL
  Filled 2020-09-28 (×4): qty 2

## 2020-09-28 MED ORDER — SODIUM CHLORIDE 0.9% FLUSH: 3.0000 mL | Freq: Two times a day (BID) | INTRAVENOUS | Status: DC

## 2020-09-28 MED ORDER — DEXAMETHASONE SODIUM PHOSPHATE 10 MG/ML IJ SOLN
INTRAMUSCULAR | Status: DC | PRN
  Administered 2020-09-28: 8 mg via INTRAVENOUS

## 2020-09-28 MED ORDER — THROMBIN 20000 UNITS EX SOLR
CUTANEOUS | Status: AC
  Filled 2020-09-28: qty 20000

## 2020-09-28 MED ORDER — FLEET ENEMA 7-19 GM/118ML RE ENEM: 1.0000 | ENEMA | Freq: Once | RECTAL | Status: DC | PRN

## 2020-09-28 MED ORDER — ROCURONIUM BROMIDE 10 MG/ML (PF) SYRINGE
PREFILLED_SYRINGE | INTRAVENOUS | Status: AC
  Filled 2020-09-28: qty 10

## 2020-09-28 MED ORDER — PREDNISOLONE ACETATE 1 % OP SUSP
1.0000 [drp] | Freq: Four times a day (QID) | OPHTHALMIC | Status: DC
  Filled 2020-09-28: qty 5

## 2020-09-28 MED ORDER — PROPOFOL 10 MG/ML IV BOLUS
INTRAVENOUS | Status: AC
  Filled 2020-09-28: qty 40

## 2020-09-28 MED ORDER — DIAZEPAM 5 MG PO TABS
5.0000 mg | ORAL_TABLET | Freq: Four times a day (QID) | ORAL | Status: DC | PRN
  Administered 2020-09-29: 5 mg via ORAL
  Filled 2020-09-28: qty 1

## 2020-09-28 MED ORDER — AMLODIPINE BESYLATE 5 MG PO TABS
5.0000 mg | ORAL_TABLET | Freq: Every day | ORAL | Status: DC
  Administered 2020-09-29: 5 mg via ORAL
  Filled 2020-09-28: qty 1

## 2020-09-28 MED ORDER — SENNOSIDES-DOCUSATE SODIUM 8.6-50 MG PO TABS: 1.0000 | ORAL_TABLET | Freq: Every evening | ORAL | Status: DC | PRN

## 2020-09-28 MED ORDER — ACETAMINOPHEN 650 MG RE SUPP: 650.0000 mg | RECTAL | Status: DC | PRN

## 2020-09-28 MED ORDER — OXYCODONE HCL 5 MG/5ML PO SOLN: 5.0000 mg | Freq: Once | ORAL | Status: DC | PRN

## 2020-09-28 MED ORDER — CHLORHEXIDINE GLUCONATE 0.12 % MT SOLN
15.0000 mL | Freq: Once | OROMUCOSAL | Status: AC
  Administered 2020-09-28: 15 mL via OROMUCOSAL
  Filled 2020-09-28: qty 15

## 2020-09-28 MED ORDER — MORPHINE SULFATE (PF) 2 MG/ML IV SOLN: 2.0000 mg | INTRAVENOUS | Status: DC | PRN

## 2020-09-28 MED ORDER — CELECOXIB 200 MG PO CAPS
200.0000 mg | ORAL_CAPSULE | Freq: Two times a day (BID) | ORAL | Status: DC
  Administered 2020-09-28 – 2020-09-29 (×3): 200 mg via ORAL
  Filled 2020-09-28 (×3): qty 1

## 2020-09-28 MED ORDER — TRIAMCINOLONE ACETONIDE 0.1 % EX CREA: 1.0000 "application " | TOPICAL_CREAM | Freq: Every day | CUTANEOUS | Status: DC | PRN

## 2020-09-28 MED ORDER — CHLORHEXIDINE GLUCONATE CLOTH 2 % EX PADS: 6.0000 | MEDICATED_PAD | Freq: Once | CUTANEOUS | Status: DC

## 2020-09-28 MED ORDER — THROMBIN 20000 UNITS EX SOLR
CUTANEOUS | Status: DC | PRN
  Administered 2020-09-28: 20 mL via TOPICAL

## 2020-09-28 MED ORDER — FAMOTIDINE 20 MG PO TABS: 40.0000 mg | ORAL_TABLET | Freq: Every day | ORAL | Status: DC | PRN

## 2020-09-28 MED ORDER — 0.9 % SODIUM CHLORIDE (POUR BTL) OPTIME
TOPICAL | Status: DC | PRN
  Administered 2020-09-28: 1000 mL

## 2020-09-28 MED ORDER — CEFAZOLIN SODIUM 1 G IJ SOLR
INTRAMUSCULAR | Status: AC
  Filled 2020-09-28: qty 20

## 2020-09-28 MED ORDER — PHENYLEPHRINE HCL-NACL 20-0.9 MG/250ML-% IV SOLN
INTRAVENOUS | Status: DC | PRN
Start: 2020-09-28 — End: 2020-09-28
  Administered 2020-09-28: 50 ug/min via INTRAVENOUS

## 2020-09-28 MED ORDER — ACETAMINOPHEN 10 MG/ML IV SOLN
INTRAVENOUS | Status: AC
  Filled 2020-09-28: qty 100

## 2020-09-28 MED ORDER — OXYCODONE HCL 5 MG PO TABS
5.0000 mg | ORAL_TABLET | ORAL | Status: DC | PRN
  Administered 2020-09-30: 5 mg via ORAL
  Filled 2020-09-28: qty 1

## 2020-09-28 MED ORDER — FENTANYL CITRATE (PF) 250 MCG/5ML IJ SOLN
INTRAMUSCULAR | Status: DC | PRN
  Administered 2020-09-28: 50 ug via INTRAVENOUS
  Administered 2020-09-28: 100 ug via INTRAVENOUS
  Administered 2020-09-28 (×2): 25 ug via INTRAVENOUS

## 2020-09-28 MED ORDER — BISACODYL 5 MG PO TBEC: 5.0000 mg | DELAYED_RELEASE_TABLET | Freq: Every day | ORAL | Status: DC | PRN

## 2020-09-28 MED ORDER — METOPROLOL SUCCINATE ER 100 MG PO TB24: 100.0000 mg | ORAL_TABLET | Freq: Every day | ORAL | Status: DC

## 2020-09-28 MED ORDER — ACETAMINOPHEN 10 MG/ML IV SOLN
1000.0000 mg | Freq: Once | INTRAVENOUS | Status: DC | PRN
Start: 2020-09-28 — End: 2020-09-28
  Administered 2020-09-28: 1000 mg via INTRAVENOUS

## 2020-09-28 MED ORDER — DEXAMETHASONE SODIUM PHOSPHATE 10 MG/ML IJ SOLN
INTRAMUSCULAR | Status: AC
  Filled 2020-09-28: qty 1

## 2020-09-28 MED ORDER — LIDOCAINE 2% (20 MG/ML) 5 ML SYRINGE
INTRAMUSCULAR | Status: DC | PRN
  Administered 2020-09-28: 60 mg via INTRAVENOUS

## 2020-09-28 MED ORDER — OXYCODONE HCL 5 MG PO TABS
10.0000 mg | ORAL_TABLET | ORAL | Status: DC | PRN
  Administered 2020-09-29 (×3): 10 mg via ORAL
  Filled 2020-09-28 (×3): qty 2

## 2020-09-28 MED ORDER — LACTATED RINGERS IV SOLN: INTRAVENOUS | Status: DC

## 2020-09-28 MED ORDER — CALCIUM-MAGNESIUM 500-250 MG PO TABS: ORAL_TABLET | Freq: Every day | ORAL | Status: DC

## 2020-09-28 MED ORDER — DULOXETINE HCL 30 MG PO CPEP
60.0000 mg | ORAL_CAPSULE | Freq: Every day | ORAL | Status: DC
  Administered 2020-09-28 – 2020-09-29 (×2): 60 mg via ORAL
  Filled 2020-09-28 (×2): qty 2

## 2020-09-28 MED ORDER — RALOXIFENE HCL 60 MG PO TABS
60.0000 mg | ORAL_TABLET | Freq: Every day | ORAL | Status: DC
  Administered 2020-09-29: 60 mg via ORAL
  Filled 2020-09-28 (×2): qty 1

## 2020-09-28 MED ORDER — ALBUMIN HUMAN 5 % IV SOLN: INTRAVENOUS | Status: DC | PRN

## 2020-09-28 MED ORDER — EPHEDRINE SULFATE-NACL 50-0.9 MG/10ML-% IV SOSY
PREFILLED_SYRINGE | INTRAVENOUS | Status: DC | PRN
  Administered 2020-09-28: 5 mg via INTRAVENOUS
  Administered 2020-09-28: 10 mg via INTRAVENOUS
  Administered 2020-09-28 (×2): 5 mg via INTRAVENOUS

## 2020-09-28 MED ORDER — LISINOPRIL 20 MG PO TABS
20.0000 mg | ORAL_TABLET | Freq: Every day | ORAL | Status: DC
  Administered 2020-09-29: 20 mg via ORAL
  Filled 2020-09-28: qty 1

## 2020-09-28 MED ORDER — FENTANYL CITRATE (PF) 100 MCG/2ML IJ SOLN
25.0000 ug | INTRAMUSCULAR | Status: DC | PRN
  Administered 2020-09-28: 25 ug via INTRAVENOUS

## 2020-09-28 MED ORDER — ONDANSETRON HCL 4 MG/2ML IJ SOLN: 4.0000 mg | Freq: Four times a day (QID) | INTRAMUSCULAR | Status: DC | PRN

## 2020-09-28 MED ORDER — ASPIRIN EC 81 MG PO TBEC
81.0000 mg | DELAYED_RELEASE_TABLET | Freq: Every day | ORAL | Status: DC
  Administered 2020-09-29: 81 mg via ORAL
  Filled 2020-09-28: qty 1

## 2020-09-28 MED ORDER — ONDANSETRON HCL 4 MG/2ML IJ SOLN
INTRAMUSCULAR | Status: DC | PRN
  Administered 2020-09-28: 4 mg via INTRAVENOUS

## 2020-09-28 MED ORDER — PHENOL 1.4 % MT LIQD: 1.0000 | OROMUCOSAL | Status: DC | PRN

## 2020-09-28 MED ORDER — COLESEVELAM HCL 625 MG PO TABS
1250.0000 mg | ORAL_TABLET | Freq: Every day | ORAL | Status: DC
  Filled 2020-09-28 (×3): qty 2

## 2020-09-28 MED ORDER — ORAL CARE MOUTH RINSE: 15.0000 mL | Freq: Once | OROMUCOSAL | Status: AC

## 2020-09-28 MED ORDER — MENTHOL 3 MG MT LOZG: 1.0000 | LOZENGE | OROMUCOSAL | Status: DC | PRN

## 2020-09-28 MED ORDER — LIDOCAINE-EPINEPHRINE 1 %-1:100000 IJ SOLN
INTRAMUSCULAR | Status: AC
  Filled 2020-09-28: qty 1

## 2020-09-28 MED ORDER — ONDANSETRON HCL 4 MG/2ML IJ SOLN
INTRAMUSCULAR | Status: AC
  Filled 2020-09-28: qty 2

## 2020-09-28 MED ORDER — PHENYLEPHRINE 40 MCG/ML (10ML) SYRINGE FOR IV PUSH (FOR BLOOD PRESSURE SUPPORT)
PREFILLED_SYRINGE | INTRAVENOUS | Status: DC | PRN
  Administered 2020-09-28: 80 ug via INTRAVENOUS
  Administered 2020-09-28: 240 ug via INTRAVENOUS
  Administered 2020-09-28: 80 ug via INTRAVENOUS

## 2020-09-28 MED ORDER — SODIUM CHLORIDE 0.9 % IV SOLN: 250.0000 mL | INTRAVENOUS | Status: DC

## 2020-09-28 MED ORDER — ONDANSETRON HCL 4 MG PO TABS: 4.0000 mg | ORAL_TABLET | Freq: Four times a day (QID) | ORAL | Status: DC | PRN

## 2020-09-28 MED ORDER — OXYCODONE HCL ER 10 MG PO T12A
10.0000 mg | EXTENDED_RELEASE_TABLET | Freq: Two times a day (BID) | ORAL | Status: DC
  Administered 2020-09-28 – 2020-09-29 (×3): 10 mg via ORAL
  Filled 2020-09-28 (×3): qty 1

## 2020-09-28 MED ORDER — GEMFIBROZIL 600 MG PO TABS
600.0000 mg | ORAL_TABLET | Freq: Two times a day (BID) | ORAL | Status: DC
  Filled 2020-09-28 (×5): qty 1

## 2020-09-28 MED ORDER — POTASSIUM CHLORIDE IN NACL 20-0.9 MEQ/L-% IV SOLN: INTRAVENOUS | Status: DC

## 2020-09-28 MED ORDER — ADULT MULTIVITAMIN W/MINERALS CH
1.0000 | ORAL_TABLET | Freq: Every day | ORAL | Status: DC
  Administered 2020-09-29: 1 via ORAL
  Filled 2020-09-28: qty 1

## 2020-09-28 MED ORDER — SODIUM CHLORIDE 0.9% FLUSH: 3.0000 mL | INTRAVENOUS | Status: DC | PRN

## 2020-09-28 MED ORDER — FENTANYL CITRATE (PF) 100 MCG/2ML IJ SOLN
INTRAMUSCULAR | Status: AC
  Filled 2020-09-28: qty 2

## 2020-09-28 MED ORDER — CEFAZOLIN SODIUM-DEXTROSE 2-3 GM-%(50ML) IV SOLR
INTRAVENOUS | Status: DC | PRN
  Administered 2020-09-28: 2 g via INTRAVENOUS

## 2020-09-28 MED ORDER — FUROSEMIDE 20 MG PO TABS
20.0000 mg | ORAL_TABLET | Freq: Every day | ORAL | Status: DC
  Filled 2020-09-28: qty 1

## 2020-09-28 MED ORDER — ZOLPIDEM TARTRATE 5 MG PO TABS: 5.0000 mg | ORAL_TABLET | Freq: Every evening | ORAL | Status: DC | PRN

## 2020-09-28 MED ORDER — ROCURONIUM BROMIDE 10 MG/ML (PF) SYRINGE
PREFILLED_SYRINGE | INTRAVENOUS | Status: DC | PRN
Start: 2020-09-28 — End: 2020-09-28
  Administered 2020-09-28: 30 mg via INTRAVENOUS
  Administered 2020-09-28: 10 mg via INTRAVENOUS
  Administered 2020-09-28: 60 mg via INTRAVENOUS
  Administered 2020-09-28: 10 mg via INTRAVENOUS
  Administered 2020-09-28: 20 mg via INTRAVENOUS

## 2020-09-28 MED ORDER — FENTANYL CITRATE (PF) 250 MCG/5ML IJ SOLN
INTRAMUSCULAR | Status: AC
  Filled 2020-09-28: qty 5

## 2020-09-28 MED ORDER — LEVOTHYROXINE SODIUM 100 MCG PO TABS
100.0000 ug | ORAL_TABLET | Freq: Every day | ORAL | Status: DC
  Administered 2020-09-29 – 2020-09-30 (×2): 100 ug via ORAL
  Filled 2020-09-28 (×2): qty 1

## 2020-09-28 MED ORDER — ACETAMINOPHEN 500 MG PO TABS: 1000.0000 mg | ORAL_TABLET | Freq: Once | ORAL | Status: DC | PRN

## 2020-09-28 MED ORDER — ACETAMINOPHEN 160 MG/5ML PO SOLN: 1000.0000 mg | Freq: Once | ORAL | Status: DC | PRN

## 2020-09-28 MED ORDER — THROMBIN 5000 UNITS EX SOLR
CUTANEOUS | Status: AC
  Filled 2020-09-28: qty 5000

## 2020-09-28 MED ORDER — PREGABALIN 100 MG PO CAPS
100.0000 mg | ORAL_CAPSULE | Freq: Two times a day (BID) | ORAL | Status: DC
  Administered 2020-09-28 – 2020-09-29 (×3): 100 mg via ORAL
  Filled 2020-09-28 (×3): qty 1

## 2020-09-28 MED ORDER — PROPOFOL 10 MG/ML IV BOLUS
INTRAVENOUS | Status: DC | PRN
  Administered 2020-09-28: 10 mg via INTRAVENOUS
  Administered 2020-09-28: 100 mg via INTRAVENOUS

## 2020-09-28 SURGICAL SUPPLY — 60 items
BAG COUNTER SPONGE SURGICOUNT (BAG) ×4 IMPLANT
BASKET BONE COLLECTION (BASKET) ×2 IMPLANT
BENZOIN TINCTURE PRP APPL 2/3 (GAUZE/BANDAGES/DRESSINGS) IMPLANT
BLADE CLIPPER SURG (BLADE) IMPLANT
BUR MATCHSTICK NEURO 3.0 LAGG (BURR) ×2 IMPLANT
BUR PRECISION FLUTE 5.0 (BURR) ×2 IMPLANT
CAGE SABLE 10X22 7-13 15D (Cage) ×4 IMPLANT
CANISTER SUCT 3000ML PPV (MISCELLANEOUS) ×2 IMPLANT
CAP LOCKING THREADED (Cap) ×4 IMPLANT
CARTRIDGE OIL MAESTRO DRILL (MISCELLANEOUS) ×1 IMPLANT
CNTNR URN SCR LID CUP LEK RST (MISCELLANEOUS) ×1 IMPLANT
CONNECTOR LATERAL (Connector) ×4 IMPLANT
CONT SPEC 4OZ STRL OR WHT (MISCELLANEOUS) ×2
COVER BACK TABLE 60X90IN (DRAPES) ×2 IMPLANT
DECANTER SPIKE VIAL GLASS SM (MISCELLANEOUS) ×2 IMPLANT
DERMABOND ADVANCED (GAUZE/BANDAGES/DRESSINGS) ×1
DERMABOND ADVANCED .7 DNX12 (GAUZE/BANDAGES/DRESSINGS) ×1 IMPLANT
DIFFUSER DRILL AIR PNEUMATIC (MISCELLANEOUS) ×2 IMPLANT
DRAPE C-ARM 42X72 X-RAY (DRAPES) ×4 IMPLANT
DRAPE C-ARMOR (DRAPES) ×2 IMPLANT
DRAPE LAPAROTOMY 100X72X124 (DRAPES) ×2 IMPLANT
DRAPE SURG 17X23 STRL (DRAPES) ×2 IMPLANT
DRSG OPSITE POSTOP 4X6 (GAUZE/BANDAGES/DRESSINGS) ×2 IMPLANT
DURAPREP 26ML APPLICATOR (WOUND CARE) ×2 IMPLANT
ELECT REM PT RETURN 9FT ADLT (ELECTROSURGICAL) ×2
ELECTRODE REM PT RTRN 9FT ADLT (ELECTROSURGICAL) ×1 IMPLANT
GAUZE 4X4 16PLY ~~LOC~~+RFID DBL (SPONGE) ×2 IMPLANT
GAUZE SPONGE 4X4 12PLY STRL (GAUZE/BANDAGES/DRESSINGS) IMPLANT
GLOVE EXAM NITRILE XL STR (GLOVE) IMPLANT
GLOVE SURG LTX SZ6.5 (GLOVE) ×6 IMPLANT
GOWN STRL REUS W/ TWL LRG LVL3 (GOWN DISPOSABLE) ×2 IMPLANT
GOWN STRL REUS W/ TWL XL LVL3 (GOWN DISPOSABLE) ×1 IMPLANT
GOWN STRL REUS W/TWL 2XL LVL3 (GOWN DISPOSABLE) ×4 IMPLANT
GOWN STRL REUS W/TWL LRG LVL3 (GOWN DISPOSABLE) ×4
GOWN STRL REUS W/TWL XL LVL3 (GOWN DISPOSABLE) ×2
KIT BASIN OR (CUSTOM PROCEDURE TRAY) ×2 IMPLANT
KIT POSITION SURG JACKSON T1 (MISCELLANEOUS) ×2 IMPLANT
KIT TURNOVER KIT B (KITS) ×2 IMPLANT
MILL MEDIUM DISP (BLADE) ×2 IMPLANT
NEEDLE HYPO 25X1 1.5 SAFETY (NEEDLE) ×2 IMPLANT
NEEDLE SPNL 18GX3.5 QUINCKE PK (NEEDLE) IMPLANT
NS IRRIG 1000ML POUR BTL (IV SOLUTION) ×2 IMPLANT
OIL CARTRIDGE MAESTRO DRILL (MISCELLANEOUS) ×2
PACK LAMINECTOMY NEURO (CUSTOM PROCEDURE TRAY) ×2 IMPLANT
PAD ARMBOARD 7.5X6 YLW CONV (MISCELLANEOUS) ×4 IMPLANT
ROD 40MM SPINAL (Rod) ×2 IMPLANT
ROD STRT CREO 5.5X55 (Rod) ×2 IMPLANT
SCREW PA THRD CREO 6.5X35 (Screw) ×4 IMPLANT
SPONGE SURGIFOAM ABS GEL 100 (HEMOSTASIS) ×2 IMPLANT
SPONGE T-LAP 4X18 ~~LOC~~+RFID (SPONGE) ×4 IMPLANT
STRIP CLOSURE SKIN 1/2X4 (GAUZE/BANDAGES/DRESSINGS) IMPLANT
SUT PROLENE 6 0 BV (SUTURE) IMPLANT
SUT VIC AB 0 CT1 18XCR BRD8 (SUTURE) ×2 IMPLANT
SUT VIC AB 0 CT1 8-18 (SUTURE) ×4
SUT VIC AB 2-0 CT1 18 (SUTURE) ×2 IMPLANT
SUT VIC AB 3-0 SH 8-18 (SUTURE) ×6 IMPLANT
TOWEL GREEN STERILE (TOWEL DISPOSABLE) ×2 IMPLANT
TOWEL GREEN STERILE FF (TOWEL DISPOSABLE) ×2 IMPLANT
TRAY FOLEY MTR SLVR 16FR STAT (SET/KITS/TRAYS/PACK) ×2 IMPLANT
WATER STERILE IRR 1000ML POUR (IV SOLUTION) ×2 IMPLANT

## 2020-09-28 NOTE — Anesthesia Procedure Notes (Signed)
Procedure Name: Intubation Date/Time: 09/28/2020 9:36 AM Performed by: Leonor Liv, CRNA Pre-anesthesia Checklist: Patient identified, Emergency Drugs available, Suction available and Patient being monitored Patient Re-evaluated:Patient Re-evaluated prior to induction Oxygen Delivery Method: Circle System Utilized Preoxygenation: Pre-oxygenation with 100% oxygen Induction Type: IV induction Ventilation: Mask ventilation without difficulty and Oral airway inserted - appropriate to patient size Laryngoscope Size: Mac and 3 Grade View: Grade I Tube type: Oral Tube size: 7.0 mm Number of attempts: 1 Airway Equipment and Method: Stylet Placement Confirmation: ETT inserted through vocal cords under direct vision, positive ETCO2 and breath sounds checked- equal and bilateral Secured at: 21 cm Tube secured with: Tape Dental Injury: Teeth and Oropharynx as per pre-operative assessment

## 2020-09-28 NOTE — Anesthesia Preprocedure Evaluation (Signed)
Anesthesia Evaluation  Patient identified by MRN, date of birth, ID band Patient awake    Reviewed: Allergy & Precautions, NPO status , Patient's Chart, lab work & pertinent test results  History of Anesthesia Complications Negative for: history of anesthetic complications  Airway Mallampati: II  TM Distance: >3 FB Neck ROM: Full    Dental  (+) Dental Advisory Given, Teeth Intact   Pulmonary neg shortness of breath, neg sleep apnea, neg COPD, neg recent URI, former smoker,    breath sounds clear to auscultation       Cardiovascular hypertension, Pt. on medications (-) angina(-) Past MI and (-) CHF  Rhythm:Regular     Neuro/Psych PSYCHIATRIC DISORDERS Anxiety negative neurological ROS     GI/Hepatic Neg liver ROS, GERD  Medicated and Controlled,  Endo/Other  neg diabetesHypothyroidism   Renal/GU Renal diseaseLab Results      Component                Value               Date                      CREATININE               0.83                09/26/2020           Lab Results      Component                Value               Date                      K                        3.6                 09/26/2020                Musculoskeletal  (+) Arthritis ,   Abdominal   Peds  Hematology negative hematology ROS (+) Lab Results      Component                Value               Date                      CREATININE               0.83                09/26/2020              Anesthesia Other Findings   Reproductive/Obstetrics                             Anesthesia Physical Anesthesia Plan  ASA: 2  Anesthesia Plan: General   Post-op Pain Management:    Induction: Intravenous  PONV Risk Score and Plan: 3 and Ondansetron and Dexamethasone  Airway Management Planned: Oral ETT  Additional Equipment: None  Intra-op Plan:   Post-operative Plan: Extubation in OR  Informed Consent: I have  reviewed the patients History and Physical, chart, labs and discussed the procedure including the risks, benefits and alternatives for the proposed anesthesia with  the patient or authorized representative who has indicated his/her understanding and acceptance.     Dental advisory given  Plan Discussed with: CRNA and Anesthesiologist  Anesthesia Plan Comments:         Anesthesia Quick Evaluation

## 2020-09-28 NOTE — Op Note (Signed)
09/28/2020  3:23 PM  PATIENT:  Ashley Mercer  80 y.o. female  PRE-OPERATIVE DIAGNOSIS:  Lumbar stenosis with neurogenic claudication Osteoarthritis L5/S1 facet POST-OPERATIVE DIAGNOSIS:  Lumbar stenosis with neurogenic claudication  PROCEDURE:  Procedure(s): Lumbar five Sacral one Posterior lumbar interbody fusion(globus) expandable cages Packed with local autograft Laminectomy in excess of needed exposure for a plif Segmental pedicle screw fixation L2-S1 SURGEON:  Surgeon(s): Ashok Pall, MD Vallarie Mare, MD  ASSISTANTS:Thomas, Roderic Palau  ANESTHESIA:   general  EBL:  Total I/O In: 2100 [I.V.:1600; IV Piggyback:500] Out: [id number] [Urine:320; Blood:425]  BLOOD ADMINISTERED:none  CELL SAVER GIVEN:none  COUNT:per nursing  DRAINS: none   SPECIMEN:  No Specimen  DICTATION: Ashley Mercer is a 80 y.o. female whom was taken to the operating room intubated, and placed under a general anesthetic without difficulty. A foley catheter was placed under sterile conditions. She was positioned prone on a Jackson table with all pressure points properly padded.  Her lumbar region and previous incision was prepped and draped in a sterile manner. I infiltrated 10cc's 1/2%lidocaine/1:2000,000 strength epinephrine into the planned incision. I opened the skin with a 10 blade and took the incision down to the thoracolumbar fascia. I exposed the lamina of L5 and S1 in a subperiosteal fashion bilaterally. I confirmed my location with an intraoperative xray.  I placed self retaining retractors and started the decompression.   We decompressed the spinal canal via a complete laminectomy of L5, Inferior facetectomies L5, and partial superior S1 facetectomies. I decompressed the spinal canal and the lateral recesses, and neural foramina with the drill and Kerrison punches. The thecal sac was decompressed and the L5, and S1 roots.  PLIF's were performed at L5/S1 in the same fashion. I opened the disc space  with a 15 blade then used a variety of instruments to remove the disc and prepare the space for the arthrodesis. I used curettes, rongeurs, punches, shavers for the disc space, and rasps in the discetomy. I measured the disc space and placed 2 expandable titanium cages packed with autograft morsels (Globus) into the disc space(s).   I placed pedicle screws at S1, using fluoroscopic guidance. I drilled a pilot hole, then cannulated the pedicle with a drill at each site. I then tapped each pedicle, assessing each site for pedicle violations. No cutouts were appreciated. Screws (globus) were then placed at each site without difficulty. I attached rods and locking caps with the appropriate tools. The locking caps were secured with torque limited screwdrivers. Final films were performed and the final construct appeared to be in good position.  I closed the wound in a layered fashion. I approximated the thoracolumbar fascia, subcutaneous, and subcuticular planes with vicryl sutures. I used dermabond, and an occlusive bandage for a sterile dressing. Her endotracheal tube was removed after placing her supine on the OR stretcher.    PLAN OF CARE: Admit to inpatient   PATIENT DISPOSITION:  PACU - hemodynamically stable.   Delay start of Pharmacological VTE agent (>24hrs) due to surgical blood loss or risk of bleeding:  yes

## 2020-09-28 NOTE — H&P (Signed)
BP 139/60   Pulse 64   Temp 97.9 F (36.6 C) (Oral)   Resp 18   Ht '4\' 11"'$  (1.499 m)   Wt 89.1 kg   SpO2 95%   BMI 39.69 kg/m ' Ashley Mercer is a long-time patient of mine whom I have performed an ACDF at C3-4 secondary to myelopathy and cord compression and an L5-S1 lumbar decompression.  She has also been seen and treated by Dr. Joya Salm.  She is here today because she is having problems swallowing, which she noticed in March.  Her weight has changed from 220 pounds down to 197 today.  She has had problems with her walking and gait.  She can only stand for 10 minutes without severe pain.  She has difficulty in her left hand with fine motor movement, and she feels that the 2nd, 3rd and 4th digits are glued together at times.  She recently had a left knee replacement this year.  Weight is 197 pounds.  Temperature is 97.3, blood pressure 142/91, pulse 91, pain 9/10.  She says that these symptoms have been ongoing for approximately 2 years.     PHYSICAL EXAMINATION :  On exam, Ashley Mercer is alert, oriented by 4.  She uses a cane when walking.  Her gait is unsteady.  She does have some mild weakness in hip extension that I noticed on the right side.  Hamstrings, quadriceps, hip flexors are essentially normal on manual exam.  She, in the cervical spine, had good upper extremity strength, normal coordination, smooth movements.  Wounds are well healed on the cervical spine and lumbar region.   Mrs. Fincke returns with an MRI of the cervical spine and of the lumbar spine.  The cervical spine still shows stenosis present at C3-4.  Despite the fact that she had the ACDF at C3-4, she has a significant amount of posterior element and there is canal compromise.  She says that the pain here is something she can deal with using extra-strength Tylenol.  In the lumbar spine, I believe the biggest reason is the stenosis present at L5-S1, though this has been there previously.  I think this is the reason for her pain  above the level of the last arthrodesis at L2-3.  She has some minor changes and minor stenosis, but nothing close to L5-S1.  I am going to go ahead and perform a PLIF, which most likely is actually just going to be a decompression and pedicle screw fixation at L5-S1, and we will do that as we can get her on the schedule.  We discussed this at great length, approximately 15 to 20 minutes.

## 2020-09-28 NOTE — Transfer of Care (Signed)
Immediate Anesthesia Transfer of Care Note  Patient: Ashley Mercer  Procedure(s) Performed: Lumbar five Sacral one Posterior lumbar interbody fusion  Patient Location: PACU  Anesthesia Type:General  Level of Consciousness: drowsy  Airway & Oxygen Therapy: Patient Spontanous Breathing, Patient connected to face mask oxygen and oral airway  Post-op Assessment: Report given to RN and Post -op Vital signs reviewed and stable  Post vital signs: Reviewed and stable  Last Vitals:  Vitals Value Taken Time  BP 113/60 09/28/20 1440  Temp    Pulse 70 09/28/20 1444  Resp 23 09/28/20 1444  SpO2 99 % 09/28/20 1444  Vitals shown include unvalidated device data.  Last Pain:  Vitals:   09/28/20 0728  TempSrc:   PainSc: 0-No pain      Patients Stated Pain Goal: 2 ([id number])  Complications: No notable events documented.

## 2020-09-29 MED ORDER — OXYCODONE HCL 5 MG PO TABS: 5.0000 mg | ORAL_TABLET | Freq: Four times a day (QID) | ORAL | 0 refills | Status: AC | PRN

## 2020-09-29 NOTE — Discharge Instructions (Addendum)
Spinal Fusion Care After Refer to this sheet in the next few weeks. These instructions provide you with information on caring for yourself after your procedure. Your caregiver may also give you more specific instructions. Your treatment has been planned according to current medical practices, but problems sometimes occur. Call your caregiver if you have any problems or questions after your procedure. HOME CARE INSTRUCTIONS  Take whatever pain medicine has been prescribed by your caregiver. Do not take over-the-counter pain medicine unless directed otherwise by your caregiver.  Do not drive if you are taking narcotic pain medicines.  Change your bandage (dressing) if necessary or as directed by your caregiver.  You may shower. The wound may get wet, simply pat the area dry. It will take ~2 weeks for the glue to peel off. If you have been prescribed medicine to prevent your blood from clotting, follow the directions carefully.  Check the area around your incision often. Look for redness and swelling. Also, look for anything leaking from your wound. You can use a mirror or have a family member inspect your incision if it is in a place where it is difficult for you to see.  Ask your caregiver what activities you should avoid and for how long.  Walk as much as possible.  Do not lift anything heavier than 5 lbs until your caregiver says it is safe.  Do not twist or bend for a few weeks. Try not to pull on things. Avoid sitting for long periods of time. Change positions at least every hour.       Wound Care Leave incision open to air. You may shower. Do not scrub directly on incision.  Do not put any creams, lotions, or ointments on incision. Activity Walk each and every day, increasing distance each day. No lifting greater than 5 lbs.  Avoid bending, arching, and twisting. No driving for 2 weeks; may ride as a passenger locally. If provided with back brace, wear when out of bed.  It is not  necessary to wear in bed. Diet Resume your normal diet.  Return to Work Will be discussed at you follow up appointment. Call Your Doctor If Any of These Occur Redness, drainage, or swelling at the wound.  Temperature greater than 101 degrees. Severe pain not relieved by pain medication. Incision starts to come apart. Follow Up Appt Call today for appointment in 4 weeks [phone]) or for problems.  If you have any hardware placed in your spine, you will need an x-ray before your appointment.

## 2020-09-29 NOTE — Progress Notes (Signed)
Patient ID: Ashley Mercer, female   DOB: Oct 08, 1940, 80 y.o.   MRN: [id number] BP 134/61 (BP Location: Right Arm)   Pulse (!) 56   Temp 98 F (36.7 C) (Oral)   Resp 18   Ht '4\' 11"'$  (1.499 m)   Wt 89.1 kg   SpO2 95%   BMI 39.69 kg/m  Alert and oriented x 4, speech is clear and fluent Moving lower extremities well Wound dressing stained Worked well with pt, ot Discharge tomorrow

## 2020-09-29 NOTE — Discharge Summary (Signed)
Physician Discharge Summary  Patient ID: Ashley Mercer MRN: [id number] DOB/AGE: 04-11-1940 80 y.o.  Admit date: 09/28/2020 Discharge date: 09/29/2020  Admission Diagnoses:lumbar stenosis L5/S1 with neurogenic claudication Osteoarthritis L5/S1 facets  Discharge Diagnoses:  Active Problems:   Lumbar stenosis with neurogenic claudication   Discharged Condition: good  Hospital Course: Ashley Mercer was admitted and  taken to the operating room for an uncomplicated Plif at [id number] using globus cages. Post op she has had drainage from the wound, serosanguineous. No evidence of infection at discharge. She is tolerating a regular diet, voiding, and ambulating.   Treatments: surgery: Lumbar five Sacral one Posterior lumbar interbody fusion(globus) expandable cages Packed with local autograft Laminectomy in excess of needed exposure for a plif Segmental pedicle screw fixation L2-S1  Discharge Exam: Blood pressure 134/61, pulse (!) 56, temperature 98 F (36.7 C), temperature source Oral, resp. rate 18, height '4\' 11"'$  (1.499 m), weight 89.1 kg, SpO2 95 %. General appearance: alert, cooperative, appears stated age, and mild distress  Disposition: Discharge disposition: 01-Home or Self Care      Lumbar stenosis with neurogenic claudication  Allergies as of 09/29/2020   No Known Allergies      Medication List     TAKE these medications    acetaminophen 500 MG tablet Commonly known as: TYLENOL Take 1,000 mg by mouth every 4 (four) hours as needed (back pain.).   amLODipine 5 MG tablet Commonly known as: NORVASC Take 5 mg by mouth daily.   aspirin EC 81 MG tablet Take 81 mg by mouth daily. Swallow whole.   CAL-MAG PO Take 1 tablet by mouth daily.   colesevelam 625 MG tablet Commonly known as: WELCHOL Take 1,250 mg by mouth at bedtime.   cyclobenzaprine 10 MG tablet Commonly known as: FLEXERIL Take 1 tablet (10 mg total) by mouth 3 (three) times daily as needed for muscle  spasms. What changed: how much to take   docusate sodium 100 MG capsule Commonly known as: COLACE Take 100 mg by mouth daily.   DULoxetine 60 MG capsule Commonly known as: CYMBALTA Take 60 mg by mouth at bedtime.   famotidine 40 MG tablet Commonly known as: PEPCID Take 40 mg by mouth daily as needed for heartburn or indigestion.   furosemide 20 MG tablet Commonly known as: LASIX Take 20 mg by mouth daily.   gemfibrozil 600 MG tablet Commonly known as: LOPID Take 600 mg by mouth 2 (two) times daily.   levothyroxine 100 MCG tablet Commonly known as: SYNTHROID Take 100 mcg by mouth daily before breakfast.   metoprolol succinate 100 MG 24 hr tablet Commonly known as: TOPROL-XL Take 100 mg by mouth daily.   multivitamin with minerals Tabs tablet Take 1 tablet by mouth daily.   oxyCODONE 5 MG immediate release tablet Commonly known as: Oxy IR/ROXICODONE Take 1 tablet (5 mg total) by mouth every 6 (six) hours as needed for up to 8 days for moderate pain ((score 4 to 6)).   prednisoLONE acetate 1 % ophthalmic suspension Commonly known as: PRED FORTE Place 1 drop into the right eye in the morning, at noon, in the evening, and at bedtime.   pregabalin 100 MG capsule Commonly known as: LYRICA Take 100 mg by mouth 2 (two) times daily.   quinapril-hydrochlorothiazide 20-25 MG tablet Commonly known as: ACCURETIC Take 1 tablet by mouth daily.   raloxifene 60 MG tablet Commonly known as: EVISTA Take 60 mg by mouth daily.   triamcinolone cream 0.1 % Commonly known  as: KENALOG Apply 1 application topically daily as needed (Eczema).        Follow-up Information     Ashok Pall, MD Follow up in 3 week(s).   Specialty: Neurosurgery Why: please call to make an appointment Contact information: [address] [place] [address] [phone]                 Signed: Ashok Pall 09/29/2020, 7:16 PM

## 2020-09-29 NOTE — Evaluation (Signed)
Physical Therapy Evaluation Patient Details Name: Ashley Mercer MRN: [id number] DOB: 29-Jun-1940 Today's Date: 09/29/2020  History of Present Illness  Pt is a 80 y/o female who presented due to difficulties in swallowing and standing greater then 10 mins without severe pain. pt s/p Segmental pedicle screw fixation L2-S1.  PMH but not limited to: ACDF at C3-4, L5-s1 lumbar decompression, LTKA (22), R TKA (21), HTN.  Clinical Impression  Pt admitted with above diagnosis. At the time of PT eval, pt was able to demonstrate transfers and ambulation with gross supervision for safety to min guard assist and SPC for support. Pt was motivated to ambulate without an AD, however decreased tolerance for functional activity was evident. 4/5 DOE noted throughout gait training. Recommend rollator until endurance improves however pt would rather continue with occasional use of SPC. Pt was educated on precautions, appropriate activity progression, and car transfer. Pt currently with functional limitations due to the deficits listed below (see PT Problem List). Pt will benefit from skilled PT to increase their independence and safety with mobility to allow discharge to the venue listed below.         Recommendations for follow up therapy are one component of a multi-disciplinary discharge planning process, led by the attending physician.  Recommendations may be updated based on patient status, additional functional criteria and insurance authorization.  Follow Up Recommendations No PT follow up    Equipment Recommendations  None recommended by PT    Recommendations for Other Services       Precautions / Restrictions Precautions Precautions: Back Precaution Booklet Issued: Yes (comment) Precaution Comments: pt reviewed Restrictions Weight Bearing Restrictions: No      Mobility  Bed Mobility               General bed mobility comments: Pt was received sitting up in the recliner.     Transfers Overall transfer level: Needs assistance Equipment used: Straight cane Transfers: Sit to/from Stand Sit to Stand: Supervision         General transfer comment: VC's for improved posture during sit<>stand. No assist required but increased time taken due to pain.  Ambulation/Gait Ambulation/Gait assistance: Supervision;Min guard Gait Distance (Feet): 150 Feet Assistive device: Straight cane;None Gait Pattern/deviations: Step-through pattern;Decreased stride length;Trunk flexed;Wide base of support Gait velocity: Decreased Gait velocity interpretation: <1.31 ft/sec, indicative of household ambulator General Gait Details: Pt with laborious gait with very flexed trunk and decreased floor clearance when advancing LE's. Pt initially with SPC but picked it up and carried it for most of gait training. Recommended a RW for energy conservation as pt with 4/5 DOE however she prefers to try without an AD if able. Overall she appeared unsteady but did not require any assistance and did not have any overt LOB throughout session.  Stairs            Wheelchair Mobility    Modified Rankin (Stroke Patients Only)       Balance Overall balance assessment: Needs assistance Sitting-balance support: Feet supported;No upper extremity supported Sitting balance-Leahy Scale: Fair     Standing balance support: No upper extremity supported;During functional activity                                 Pertinent Vitals/Pain Pain Assessment: Faces Faces Pain Scale: Hurts little more Pain Location: sx site Pain Descriptors / Indicators: Aching;Contraction;Guarding Pain Intervention(s): Limited activity within patient's tolerance;Monitored during session;Repositioned  Home Living Family/patient expects to be discharged to:: Private residence Living Arrangements: Children Available Help at Discharge: Family;Available 24 hours/day Type of Home: House Home Access: Stairs  to enter Entrance Stairs-Rails: Chemical engineer of Steps: 2 Home Layout: Two level;Able to live on main level with bedroom/bathroom Home Equipment: Grab bars - toilet;Shower seat;Walker - 2 wheels;Cane - single point Additional Comments: living with daughter and other family availble to assist 24/7    Prior Function Level of Independence: Independent with assistive device(s)         Comments: use of SPC     Hand Dominance   Dominant Hand: Right    Extremity/Trunk Assessment   Upper Extremity Assessment Upper Extremity Assessment: Defer to OT evaluation    Lower Extremity Assessment Lower Extremity Assessment: Generalized weakness    Cervical / Trunk Assessment Cervical / Trunk Assessment: Other exceptions Cervical / Trunk Exceptions: Very flexed posture; s/p surgery  Communication   Communication: No difficulties  Cognition Arousal/Alertness: Awake/alert Behavior During Therapy: WFL for tasks assessed/performed Overall Cognitive Status: Within Functional Limits for tasks assessed                                        General Comments      Exercises     Assessment/Plan    PT Assessment Patient needs continued PT services  PT Problem List Decreased strength;Decreased activity tolerance;Decreased balance;Decreased mobility;Decreased knowledge of use of DME;Decreased safety awareness;Decreased knowledge of precautions;Pain       PT Treatment Interventions DME instruction;Gait training;Stair training;Functional mobility training;Therapeutic activities;Therapeutic exercise;Neuromuscular re-education;Patient/family education    PT Goals (Current goals can be found in the Care Plan section)  Acute Rehab PT Goals Patient Stated Goal: to go to daughter's home PT Goal Formulation: With patient Time For Goal Achievement: 10/06/20 Potential to Achieve Goals: Good    Frequency Min 5X/week   Barriers to discharge         Co-evaluation               AM-PAC PT "6 Clicks" Mobility  Outcome Measure Help needed turning from your back to your side while in a flat bed without using bedrails?: None Help needed moving from lying on your back to sitting on the side of a flat bed without using bedrails?: A Little Help needed moving to and from a bed to a chair (including a wheelchair)?: A Little Help needed standing up from a chair using your arms (e.g., wheelchair or bedside chair)?: A Little Help needed to walk in hospital room?: A Little Help needed climbing 3-5 steps with a railing? : A Little 6 Click Score: 19    End of Session Equipment Utilized During Treatment: Gait belt Activity Tolerance: Patient tolerated treatment well Patient left: in chair;with call bell/phone within reach;with family/visitor present Nurse Communication: Mobility status PT Visit Diagnosis: Unsteadiness on feet (R26.81);Pain Pain - part of body:  (back)    Time: [id number] PT Time Calculation (min) (ACUTE ONLY): 19 min   Charges:   PT Evaluation $PT Eval Low Complexity: 1 Low          Rolinda Roan, PT, DPT Acute Rehabilitation Services Pager: [phone] Office: [phone]   Thelma Comp 09/29/2020, 12:10 PM

## 2020-09-30 NOTE — Progress Notes (Signed)
Occupational Therapy Evaluation - late entry    09/29/20 0700  OT Visit Information  Last OT Received On 09/29/20  Assistance Needed +1  History of Present Illness Pt is a 80 yr old female who presented due to difficulties in swallowing and standing greater then 10 mins without severe pain. pt s/p Segmental pedicle screw fixation L2-S1.  PMH but not limited to: ACDF at C3-4, L5-s1 lumbar decompression, LTKA (22), R TKA (21), HTN  Precautions  Precautions Back  Precaution Booklet Issued Yes (comment)  Precaution Comments pt reviewed  Restrictions  Weight Bearing Restrictions No  Home Living  Family/patient expects to be discharged to: Private residence  Living Arrangements Children  Available Help at Discharge Family;Available 24 hours/day  Type of Home House  Home Access Stairs to enter  Entrance Stairs-Number of Steps 2  Entrance Stairs-Rails Left;Right  Home Layout Two level;Able to live on main level with bedroom/bathroom  Alternate Level Stairs-Number of Steps 14  Bathroom Shower/Tub Walk-in shower  [place] Grab bars - toilet;Shower seat;Walker - 2 wheels;Kasandra Knudsen - single point  Additional Comments living with daughter and other fmaily availble to asisst 24/7  Prior Function  Level of Independence Independent with assistive device(s)  Comments use of SPC  Communication  Communication No difficulties  Pain Assessment  Pain Assessment 0-10  Pain Score 6  Pain Location sx site  Pain Descriptors / Indicators Aching;Contraction;Guarding  Pain Intervention(s) Limited activity within patient's tolerance;Monitored during session  Cognition  Arousal/Alertness Awake/alert  Behavior During Therapy WFL for tasks assessed/performed  Overall Cognitive Status Within Functional Limits for tasks assessed  Upper Extremity Assessment  Upper Extremity Assessment Overall WFL for tasks assessed  Lower Extremity Assessment   Lower Extremity Assessment Defer to PT evaluation  Cervical / Trunk Assessment  Cervical / Trunk Assessment Other exceptions  Cervical / Trunk Exceptions s/p sx  ADL  Overall ADL's  Needs assistance/impaired  Eating/Feeding Independent;Sitting  Grooming Wash/dry hands;Wash/dry face;Supervision/safety;Sitting;Standing;Cueing for safety;Cueing for sequencing  Upper Body Bathing Modified independent;Cueing for safety;Cueing for sequencing;Sitting  Lower Body Bathing Cueing for safety;Cueing for sequencing;Sit to/from stand;Min guard  Upper Body Dressing  Supervision/safety;Cueing for safety;Cueing for sequencing;Sitting  Lower Body Dressing Minimal assistance;Cueing for safety;Cueing for sequencing;Sit to/from Environmental education officer guard;Cueing for safety;Cueing for sequencing  Toileting- Water quality scientist and Hygiene Min guard;Cueing for safety;Cueing for sequencing;Sit to/from stand  Tub/ Banker for safety;Cueing for sequencing;Ambulation;Min guard  Functional mobility during ADLs Min guard;Cueing for safety;Cueing for sequencing;Cane  General ADL Comments pt becomes SOB with standing ADLs  Vision- History  Baseline Vision/History 1 Wears glasses  Ability to See in Adequate Light 0 Adequate  Patient Visual Report No change from baseline  Vision- Assessment  Vision Assessment? No apparent visual deficits  Bed Mobility  Overal bed mobility  (presented sitting in bed)  Transfers  Overall transfer level Needs assistance  Equipment used Straight cane  Transfers Sit to/from Stand  Sit to Stand Supervision  General transfer comment increase time due to pain with transfers  OT - End of Session  Equipment Utilized During Treatment Gait belt  Activity Tolerance Patient tolerated treatment well  Patient left in chair;with call bell/phone within reach  OT Assessment  OT Recommendation/Assessment Patient needs continued OT Services  OT Visit Diagnosis Unsteadiness  on feet (R26.81);Other abnormalities of gait and mobility (R26.89);Muscle weakness (generalized) (M62.81);Pain  Pain - part of body  (sx site)  OT Problem List Decreased  strength;Decreased range of motion;Decreased activity tolerance;Impaired balance (sitting and/or standing);Decreased safety awareness;Decreased knowledge of precautions;Pain  OT Plan  OT Frequency (ACUTE ONLY) Min 2X/week  AM-PAC OT "6 Clicks" Daily Activity Outcome Measure (Version 2)  Help from another person eating meals? 4  Help from another person taking care of personal grooming? 4  Help from another person toileting, which includes using toliet, bedpan, or urinal? 3  Help from another person bathing (including washing, rinsing, drying)? 3  Help from another person to put on and taking off regular upper body clothing? 4  Help from another person to put on and taking off regular lower body clothing? 3  6 Click Score 21  Progressive Mobility  What is the highest level of mobility based on the progressive mobility assessment? Level 5 (Walks with assist in room/hall) - Balance while stepping forward/back and can walk in room with assist - Complete  Mobility Ambulated with assistance in hallway  OT Recommendation  Follow Up Recommendations Supervision/Assistance - 24 hour  OT Equipment None recommended by OT  Individuals Consulted  Consulted and Agree with Results and Recommendations Patient  Acute Rehab OT Goals  Patient Stated Goal to go to daughter's home  OT Goal Formulation With patient  Time For Goal Achievement 10/22/20  Potential to Achieve Goals Good  OT Time Calculation  OT Start Time (ACUTE ONLY) 0755  OT Stop Time (ACUTE ONLY) [id number]  OT Time Calculation (min) 27 min  OT General Charges  $OT Visit 1 Visit  OT Evaluation  $OT Eval Low Complexity 1 Low  OT Treatments  $Self Care/Home Management  8-22 mins  Written Expression  Dominant Hand Right  Note populated by [facility] OT/L; Evaluation completed  by Christia Reading OTR/L

## 2020-09-30 NOTE — Progress Notes (Signed)
Occupational Therapy Treatment Patient Details Name: Ashley Mercer MRN: [id number] DOB: February 06, 1940 Today's Date: 09/30/2020   History of present illness Pt is a 80 y/o female who presented due to difficulties in swallowing and standing greater then 10 mins without severe pain. pt s/p Segmental pedicle screw fixation L2-S1.  PMH but not limited to: ACDF at C3-4, L5-s1 lumbar decompression, LTKA (22), R TKA (21), HTN.   OT comments  Pt making steady progress towards OT goals this session. Session focus on functional mobility and education related to maintaining back precautions during BADL participation. Pt currently requires min guard assist for household distance functional mobility within pts room with straight cane. Reviewed all back precautions and provided education on compensatory methods for ADLs. Pt reports walkin shower at her daughters with seat. Pt continues to present with decreased activity tolerance, impaired balance and limited by lethargy this session. Pt would continue to benefit from skilled occupational therapy while admitted and after d/c to address the below listed limitations in order to improve overall functional mobility and facilitate independence with BADL participation. DC plan remains appropriate, will follow acutely per POC.      Recommendations for follow up therapy are one component of a multi-disciplinary discharge planning process, led by the attending physician.  Recommendations may be updated based on patient status, additional functional criteria and insurance authorization.    Follow Up Recommendations  Supervision/Assistance - 24 hour    Equipment Recommendations  None recommended by OT    Recommendations for Other Services      Precautions / Restrictions Precautions Precautions: Back Precaution Booklet Issued: No Precaution Comments: pt able to state 3/3 precautions by end of session Restrictions Weight Bearing Restrictions: No       Mobility Bed  Mobility               General bed mobility comments: pt OOB in recliner during session    Transfers Overall transfer level: Needs assistance Equipment used: Straight cane Transfers: Sit to/from Stand Sit to Stand: Min guard         General transfer comment: min guard to stand from recliner for safety, pt noted to be more shaky during session, pt reports d/t medicine    Balance Overall balance assessment: Needs assistance Sitting-balance support: Feet supported;No upper extremity supported Sitting balance-Leahy Scale: Fair     Standing balance support: Single extremity supported;During functional activity Standing balance-Leahy Scale: Fair Standing balance comment: min guard for safety                           ADL either performed or assessed with clinical judgement   ADL Overall ADL's : Needs assistance/impaired       Grooming Details (indicate cue type and reason): education provided on compensatory methods for grooming tasks such as using 2 cups for oral care and using wash cloth while washing her face               Lower Body Dressing Details (indicate cue type and reason): pt reports her daughter dons all LB clothing Toilet Transfer: Min guard;Ambulation Toilet Transfer Details (indicate cue type and reason): simulated via household distance functional mobility in room with min guard assist for safety and balance, pt was noted to be shaky during ambulation with pt attributing shakiness to medication   Toileting - Clothing Manipulation Details (indicate cue type and reason): pt reports using bidet at home   Tub/Shower Transfer Details (indicate cue  type and reason): pt reports walkin shower at her daughters house with seat Functional mobility during ADLs: Min guard;Cueing for safety General ADL Comments: pt presented this session with lethargy, impaired balance, and decreased activity tolerance. session focus on education related to compensatory  methods for ADL participation and functional mobility in room     Vision       Perception     Praxis      Cognition Arousal/Alertness: Lethargic;Suspect due to medications Behavior During Therapy: [facility] for tasks assessed/performed Overall Cognitive Status: Within Functional Limits for tasks assessed                                          Exercises     Shoulder Instructions       General Comments pt reprots plan to DC to her daughters house, where pt will have 24/7 assistance with ADLs and functional moblility, education provided on mobilizing every hour at home and compensatory methods for ADLs    Pertinent Vitals/ Pain       Pain Assessment: Faces Faces Pain Scale: Hurts a little bit Pain Location: incision site Pain Descriptors / Indicators: Sore Pain Intervention(s): Monitored during session;Repositioned  Home Living                                          Prior Functioning/Environment              Frequency  Min 2X/week        Progress Toward Goals  OT Goals(current goals can now be found in the care plan section)  Progress towards OT goals: Progressing toward goals  Acute Rehab OT Goals Patient Stated Goal: to go to daughter's home OT Goal Formulation: With patient Time For Goal Achievement: 10/22/20 Potential to Achieve Goals: Good  Plan Discharge plan remains appropriate;Frequency remains appropriate    Co-evaluation                 AM-PAC OT "6 Clicks" Daily Activity     Outcome Measure   Help from another person eating meals?: None Help from another person taking care of personal grooming?: None Help from another person toileting, which includes using toliet, bedpan, or urinal?: A Little Help from another person bathing (including washing, rinsing, drying)?: A Little Help from another person to put on and taking off regular upper body clothing?: None Help from another person to put on and  taking off regular lower body clothing?: A Lot 6 Click Score: 20    End of Session Equipment Utilized During Treatment: Other (comment) (straight cane)  OT Visit Diagnosis: Unsteadiness on feet (R26.81);Other abnormalities of gait and mobility (R26.89);Muscle weakness (generalized) (M62.81);Pain Pain - part of body:  (back incision)   Activity Tolerance Patient tolerated treatment well   Patient Left in chair;with call bell/phone within reach   Nurse Communication Mobility status        Time: [id number] OT Time Calculation (min): 12 min  Charges: OT General Charges $OT Visit: 1 Visit OT Treatments $Self Care/Home Management : 8-22 mins  Harley Alto., COTA/L Acute Rehabilitation Services [phone]  Precious Haws 09/30/2020, 8:53 AM

## 2020-09-30 NOTE — Anesthesia Postprocedure Evaluation (Signed)
Anesthesia Post Note  Patient: [name]  Procedure(s) Performed: Lumbar five Sacral one Posterior lumbar interbody fusion     Patient location during evaluation: PACU Anesthesia Type: General Level of consciousness: awake and alert Pain management: pain level controlled Vital Signs Assessment: post-procedure vital signs reviewed and stable Respiratory status: spontaneous breathing, nonlabored ventilation, respiratory function stable and patient connected to nasal cannula oxygen Cardiovascular status: blood pressure returned to baseline and stable Postop Assessment: no apparent nausea or vomiting Anesthetic complications: no   No notable events documented.  Last Vitals:  Vitals:   09/29/20 2315 09/30/20 0312  BP: (!) 99/57 (!) 104/55  Pulse: 61 67  Resp: 18 18  Temp: 36.7 C 36.5 C  SpO2: 95% 96%    Last Pain:  Vitals:   09/30/20 0602  TempSrc:   PainSc: 4                  Suzzane Quilter

## 2020-09-30 NOTE — Plan of Care (Signed)
Pt. discharged home accompanied by daughter. Prescriptions and discharge instructions given with verbalization of understanding. Incision site on back with no s/s of infection - no swelling, redness, bleeding, and/or drainage noted. Patient has an appointment with MD in 3 weeks

## 2020-10-18 DIAGNOSIS — R030 Elevated blood-pressure reading, without diagnosis of hypertension: Secondary | ICD-10-CM | POA: Diagnosis not present

## 2020-10-18 DIAGNOSIS — M48062 Spinal stenosis, lumbar region with neurogenic claudication: Secondary | ICD-10-CM | POA: Diagnosis not present

## 2020-10-18 DIAGNOSIS — Z6835 Body mass index (BMI) 35.0-35.9, adult: Secondary | ICD-10-CM | POA: Diagnosis not present

## 2020-11-03 ENCOUNTER — Other Ambulatory Visit: Payer: Medicare Other

## 2021-01-31 DIAGNOSIS — R7303 Prediabetes: Secondary | ICD-10-CM | POA: Diagnosis not present

## 2021-01-31 DIAGNOSIS — M8588 Other specified disorders of bone density and structure, other site: Secondary | ICD-10-CM | POA: Diagnosis not present

## 2021-01-31 DIAGNOSIS — E039 Hypothyroidism, unspecified: Secondary | ICD-10-CM | POA: Diagnosis not present

## 2021-01-31 DIAGNOSIS — I10 Essential (primary) hypertension: Secondary | ICD-10-CM | POA: Diagnosis not present

## 2021-01-31 DIAGNOSIS — L309 Dermatitis, unspecified: Secondary | ICD-10-CM | POA: Diagnosis not present

## 2021-01-31 DIAGNOSIS — E2839 Other primary ovarian failure: Secondary | ICD-10-CM | POA: Diagnosis not present

## 2021-01-31 DIAGNOSIS — M545 Low back pain, unspecified: Secondary | ICD-10-CM | POA: Diagnosis not present

## 2021-01-31 DIAGNOSIS — R809 Proteinuria, unspecified: Secondary | ICD-10-CM | POA: Diagnosis not present

## 2021-01-31 DIAGNOSIS — R609 Edema, unspecified: Secondary | ICD-10-CM | POA: Diagnosis not present

## 2021-01-31 DIAGNOSIS — K219 Gastro-esophageal reflux disease without esophagitis: Secondary | ICD-10-CM | POA: Diagnosis not present

## 2021-01-31 DIAGNOSIS — E782 Mixed hyperlipidemia: Secondary | ICD-10-CM | POA: Diagnosis not present

## 2021-01-31 DIAGNOSIS — R202 Paresthesia of skin: Secondary | ICD-10-CM | POA: Diagnosis not present

## 2021-03-13 DIAGNOSIS — K5730 Diverticulosis of large intestine without perforation or abscess without bleeding: Secondary | ICD-10-CM | POA: Diagnosis not present

## 2021-03-13 DIAGNOSIS — K635 Polyp of colon: Secondary | ICD-10-CM | POA: Diagnosis not present

## 2021-03-13 DIAGNOSIS — Z86010 Personal history of colonic polyps: Secondary | ICD-10-CM | POA: Diagnosis not present

## 2021-03-13 DIAGNOSIS — K633 Ulcer of intestine: Secondary | ICD-10-CM | POA: Diagnosis not present

## 2021-03-13 DIAGNOSIS — K621 Rectal polyp: Secondary | ICD-10-CM | POA: Diagnosis not present

## 2021-03-13 DIAGNOSIS — K5289 Other specified noninfective gastroenteritis and colitis: Secondary | ICD-10-CM | POA: Diagnosis not present

## 2021-03-13 DIAGNOSIS — K648 Other hemorrhoids: Secondary | ICD-10-CM | POA: Diagnosis not present

## 2021-03-15 DIAGNOSIS — K621 Rectal polyp: Secondary | ICD-10-CM | POA: Diagnosis not present

## 2021-03-15 DIAGNOSIS — K635 Polyp of colon: Secondary | ICD-10-CM | POA: Diagnosis not present

## 2021-03-15 DIAGNOSIS — K5289 Other specified noninfective gastroenteritis and colitis: Secondary | ICD-10-CM | POA: Diagnosis not present

## 2021-03-16 ENCOUNTER — Other Ambulatory Visit: Payer: Self-pay | Admitting: Gastroenterology

## 2021-03-16 DIAGNOSIS — K633 Ulcer of intestine: Secondary | ICD-10-CM

## 2021-04-05 ENCOUNTER — Other Ambulatory Visit: Payer: Medicare Other

## 2021-04-06 ENCOUNTER — Ambulatory Visit
Admission: RE | Admit: 2021-04-06 | Discharge: 2021-04-06 | Disposition: A | Discharge status: Home / Self Care | Payer: Medicare Other | Source: Ambulatory Visit | Attending: Gastroenterology | Admitting: Gastroenterology

## 2021-04-06 DIAGNOSIS — K633 Ulcer of intestine: Secondary | ICD-10-CM

## 2021-04-06 DIAGNOSIS — Z981 Arthrodesis status: Secondary | ICD-10-CM | POA: Diagnosis not present

## 2021-04-06 DIAGNOSIS — I700 Atherosclerosis of aorta: Secondary | ICD-10-CM | POA: Diagnosis not present

## 2021-04-06 DIAGNOSIS — I728 Aneurysm of other specified arteries: Secondary | ICD-10-CM | POA: Diagnosis not present

## 2021-04-06 DIAGNOSIS — K449 Diaphragmatic hernia without obstruction or gangrene: Secondary | ICD-10-CM | POA: Diagnosis not present

## 2021-04-06 MED ORDER — IOPAMIDOL (ISOVUE-370) INJECTION 76%
100.0000 mL | Freq: Once | INTRAVENOUS | Status: AC | PRN
  Administered 2021-04-06: 100 mL via INTRAVENOUS

## 2021-04-07 DIAGNOSIS — Z1231 Encounter for screening mammogram for malignant neoplasm of breast: Secondary | ICD-10-CM | POA: Diagnosis not present

## 2021-04-20 DIAGNOSIS — M48062 Spinal stenosis, lumbar region with neurogenic claudication: Secondary | ICD-10-CM | POA: Diagnosis not present

## 2021-05-09 ENCOUNTER — Ambulatory Visit
Admission: RE | Admit: 2021-05-09 | Discharge: 2021-05-09 | Disposition: A | Discharge status: Home / Self Care | Payer: Medicare Other | Source: Ambulatory Visit | Attending: Family Medicine | Admitting: Family Medicine

## 2021-05-09 DIAGNOSIS — M85852 Other specified disorders of bone density and structure, left thigh: Secondary | ICD-10-CM | POA: Diagnosis not present

## 2021-05-09 DIAGNOSIS — Z780 Asymptomatic menopausal state: Secondary | ICD-10-CM | POA: Diagnosis not present

## 2021-05-09 DIAGNOSIS — E2839 Other primary ovarian failure: Secondary | ICD-10-CM

## 2021-06-14 DIAGNOSIS — E669 Obesity, unspecified: Secondary | ICD-10-CM | POA: Diagnosis not present

## 2021-06-14 DIAGNOSIS — I129 Hypertensive chronic kidney disease with stage 1 through stage 4 chronic kidney disease, or unspecified chronic kidney disease: Secondary | ICD-10-CM | POA: Diagnosis not present

## 2021-06-14 DIAGNOSIS — R809 Proteinuria, unspecified: Secondary | ICD-10-CM | POA: Diagnosis not present

## 2021-08-18 DIAGNOSIS — I10 Essential (primary) hypertension: Secondary | ICD-10-CM | POA: Diagnosis not present

## 2021-08-18 DIAGNOSIS — R7303 Prediabetes: Secondary | ICD-10-CM | POA: Diagnosis not present

## 2021-08-18 DIAGNOSIS — E039 Hypothyroidism, unspecified: Secondary | ICD-10-CM | POA: Diagnosis not present

## 2021-08-18 DIAGNOSIS — E782 Mixed hyperlipidemia: Secondary | ICD-10-CM | POA: Diagnosis not present

## 2021-08-18 DIAGNOSIS — R202 Paresthesia of skin: Secondary | ICD-10-CM | POA: Diagnosis not present

## 2021-08-18 DIAGNOSIS — Z1331 Encounter for screening for depression: Secondary | ICD-10-CM | POA: Diagnosis not present

## 2021-08-18 DIAGNOSIS — Z6838 Body mass index (BMI) 38.0-38.9, adult: Secondary | ICD-10-CM | POA: Diagnosis not present

## 2021-08-18 DIAGNOSIS — K219 Gastro-esophageal reflux disease without esophagitis: Secondary | ICD-10-CM | POA: Diagnosis not present

## 2021-08-18 DIAGNOSIS — M8588 Other specified disorders of bone density and structure, other site: Secondary | ICD-10-CM | POA: Diagnosis not present

## 2021-08-18 DIAGNOSIS — M545 Low back pain, unspecified: Secondary | ICD-10-CM | POA: Diagnosis not present

## 2021-08-18 DIAGNOSIS — Z0000 Encounter for general adult medical examination without abnormal findings: Secondary | ICD-10-CM | POA: Diagnosis not present

## 2021-08-18 DIAGNOSIS — D649 Anemia, unspecified: Secondary | ICD-10-CM | POA: Diagnosis not present

## 2021-10-18 DIAGNOSIS — D509 Iron deficiency anemia, unspecified: Secondary | ICD-10-CM | POA: Diagnosis not present

## 2021-12-11 DIAGNOSIS — D509 Iron deficiency anemia, unspecified: Secondary | ICD-10-CM | POA: Diagnosis not present

## 2022-02-15 HISTORY — PX: COLONOSCOPY W/ POLYPECTOMY: SHX1380

## 2022-02-20 DIAGNOSIS — R809 Proteinuria, unspecified: Secondary | ICD-10-CM | POA: Diagnosis not present

## 2022-02-20 DIAGNOSIS — R350 Frequency of micturition: Secondary | ICD-10-CM | POA: Diagnosis not present

## 2022-02-20 DIAGNOSIS — R7303 Prediabetes: Secondary | ICD-10-CM | POA: Diagnosis not present

## 2022-02-20 DIAGNOSIS — E782 Mixed hyperlipidemia: Secondary | ICD-10-CM | POA: Diagnosis not present

## 2022-02-20 DIAGNOSIS — N390 Urinary tract infection, site not specified: Secondary | ICD-10-CM | POA: Diagnosis not present

## 2022-02-20 DIAGNOSIS — D509 Iron deficiency anemia, unspecified: Secondary | ICD-10-CM | POA: Diagnosis not present

## 2022-02-20 DIAGNOSIS — I10 Essential (primary) hypertension: Secondary | ICD-10-CM | POA: Diagnosis not present

## 2022-02-20 DIAGNOSIS — E039 Hypothyroidism, unspecified: Secondary | ICD-10-CM | POA: Diagnosis not present

## 2022-03-30 ENCOUNTER — Other Ambulatory Visit: Payer: Self-pay | Admitting: Gastroenterology

## 2022-03-30 DIAGNOSIS — I728 Aneurysm of other specified arteries: Secondary | ICD-10-CM

## 2022-04-30 ENCOUNTER — Ambulatory Visit
Admission: RE | Admit: 2022-04-30 | Discharge: 2022-04-30 | Disposition: A | Discharge status: Home / Self Care | Payer: Medicare Other | Source: Ambulatory Visit | Attending: Gastroenterology | Admitting: Gastroenterology

## 2022-04-30 DIAGNOSIS — I700 Atherosclerosis of aorta: Secondary | ICD-10-CM | POA: Diagnosis not present

## 2022-04-30 DIAGNOSIS — K449 Diaphragmatic hernia without obstruction or gangrene: Secondary | ICD-10-CM | POA: Diagnosis not present

## 2022-04-30 DIAGNOSIS — I728 Aneurysm of other specified arteries: Secondary | ICD-10-CM | POA: Diagnosis not present

## 2022-04-30 DIAGNOSIS — R911 Solitary pulmonary nodule: Secondary | ICD-10-CM | POA: Diagnosis not present

## 2022-04-30 MED ORDER — IOPAMIDOL (ISOVUE-370) INJECTION 76%
75.0000 mL | Freq: Once | INTRAVENOUS | Status: AC | PRN
  Administered 2022-04-30: 75 mL via INTRAVENOUS

## 2022-05-04 DIAGNOSIS — Z1231 Encounter for screening mammogram for malignant neoplasm of breast: Secondary | ICD-10-CM | POA: Diagnosis not present

## 2022-05-22 DIAGNOSIS — M4722 Other spondylosis with radiculopathy, cervical region: Secondary | ICD-10-CM | POA: Diagnosis not present

## 2022-05-22 DIAGNOSIS — M546 Pain in thoracic spine: Secondary | ICD-10-CM | POA: Diagnosis not present

## 2022-05-22 DIAGNOSIS — M48062 Spinal stenosis, lumbar region with neurogenic claudication: Secondary | ICD-10-CM | POA: Diagnosis not present

## 2022-05-22 DIAGNOSIS — Z6837 Body mass index (BMI) 37.0-37.9, adult: Secondary | ICD-10-CM | POA: Diagnosis not present

## 2022-06-06 ENCOUNTER — Other Ambulatory Visit (HOSPITAL_COMMUNITY): Payer: Self-pay | Admitting: Neurosurgery

## 2022-06-06 DIAGNOSIS — M48062 Spinal stenosis, lumbar region with neurogenic claudication: Secondary | ICD-10-CM

## 2022-06-06 DIAGNOSIS — M4722 Other spondylosis with radiculopathy, cervical region: Secondary | ICD-10-CM

## 2022-06-06 DIAGNOSIS — M546 Pain in thoracic spine: Secondary | ICD-10-CM

## 2022-06-07 ENCOUNTER — Other Ambulatory Visit: Payer: Self-pay | Admitting: Neurosurgery

## 2022-06-15 ENCOUNTER — Other Ambulatory Visit: Payer: Self-pay

## 2022-06-15 ENCOUNTER — Ambulatory Visit (HOSPITAL_COMMUNITY)
Admission: RE | Admit: 2022-06-15 | Discharge: 2022-06-15 | Disposition: A | Discharge status: Home / Self Care | Payer: Medicare Other | Source: Ambulatory Visit | Attending: Neurosurgery | Admitting: Neurosurgery

## 2022-06-15 VITALS — BP 147/77 | HR 63 | Temp 99.0°F | Resp 19 | Ht 59.0 in | Wt 199.0 lb

## 2022-06-15 DIAGNOSIS — M47814 Spondylosis without myelopathy or radiculopathy, thoracic region: Secondary | ICD-10-CM | POA: Diagnosis not present

## 2022-06-15 DIAGNOSIS — M5124 Other intervertebral disc displacement, thoracic region: Secondary | ICD-10-CM | POA: Insufficient documentation

## 2022-06-15 DIAGNOSIS — I700 Atherosclerosis of aorta: Secondary | ICD-10-CM | POA: Diagnosis not present

## 2022-06-15 DIAGNOSIS — M542 Cervicalgia: Secondary | ICD-10-CM | POA: Diagnosis not present

## 2022-06-15 DIAGNOSIS — M48062 Spinal stenosis, lumbar region with neurogenic claudication: Secondary | ICD-10-CM

## 2022-06-15 DIAGNOSIS — K449 Diaphragmatic hernia without obstruction or gangrene: Secondary | ICD-10-CM | POA: Diagnosis not present

## 2022-06-15 DIAGNOSIS — Z981 Arthrodesis status: Secondary | ICD-10-CM | POA: Insufficient documentation

## 2022-06-15 DIAGNOSIS — M5033 Other cervical disc degeneration, cervicothoracic region: Secondary | ICD-10-CM | POA: Diagnosis not present

## 2022-06-15 DIAGNOSIS — M549 Dorsalgia, unspecified: Secondary | ICD-10-CM | POA: Diagnosis not present

## 2022-06-15 DIAGNOSIS — M4722 Other spondylosis with radiculopathy, cervical region: Secondary | ICD-10-CM | POA: Diagnosis not present

## 2022-06-15 DIAGNOSIS — M2578 Osteophyte, vertebrae: Secondary | ICD-10-CM | POA: Insufficient documentation

## 2022-06-15 DIAGNOSIS — M40204 Unspecified kyphosis, thoracic region: Secondary | ICD-10-CM | POA: Diagnosis not present

## 2022-06-15 DIAGNOSIS — M4802 Spinal stenosis, cervical region: Secondary | ICD-10-CM | POA: Insufficient documentation

## 2022-06-15 DIAGNOSIS — M546 Pain in thoracic spine: Secondary | ICD-10-CM | POA: Diagnosis present

## 2022-06-15 DIAGNOSIS — M47812 Spondylosis without myelopathy or radiculopathy, cervical region: Secondary | ICD-10-CM | POA: Diagnosis not present

## 2022-06-15 DIAGNOSIS — M5414 Radiculopathy, thoracic region: Secondary | ICD-10-CM | POA: Diagnosis not present

## 2022-06-15 DIAGNOSIS — M5126 Other intervertebral disc displacement, lumbar region: Secondary | ICD-10-CM | POA: Diagnosis not present

## 2022-06-15 DIAGNOSIS — M5459 Other low back pain: Secondary | ICD-10-CM | POA: Diagnosis not present

## 2022-06-15 DIAGNOSIS — M48061 Spinal stenosis, lumbar region without neurogenic claudication: Secondary | ICD-10-CM | POA: Diagnosis not present

## 2022-06-15 DIAGNOSIS — M5022 Other cervical disc displacement, mid-cervical region, unspecified level: Secondary | ICD-10-CM | POA: Diagnosis not present

## 2022-06-15 DIAGNOSIS — M4804 Spinal stenosis, thoracic region: Secondary | ICD-10-CM | POA: Diagnosis not present

## 2022-06-15 DIAGNOSIS — M47816 Spondylosis without myelopathy or radiculopathy, lumbar region: Secondary | ICD-10-CM | POA: Insufficient documentation

## 2022-06-15 MED ORDER — OXYCODONE HCL 5 MG PO TABS: 5.0000 mg | ORAL_TABLET | ORAL | Status: DC | PRN

## 2022-06-15 MED ORDER — DIAZEPAM 5 MG PO TABS
10.0000 mg | ORAL_TABLET | Freq: Once | ORAL | Status: AC
  Administered 2022-06-15: 10 mg via ORAL

## 2022-06-15 MED ORDER — LIDOCAINE HCL (PF) 1 % IJ SOLN
5.0000 mL | Freq: Once | INTRAMUSCULAR | Status: AC
Start: 2022-06-15 — End: 2022-06-15
  Administered 2022-06-15: 5 mL via INTRADERMAL

## 2022-06-15 MED ORDER — ONDANSETRON HCL 4 MG/2ML IJ SOLN: 4.0000 mg | Freq: Once | INTRAMUSCULAR | Status: DC | PRN

## 2022-06-15 MED ORDER — IOHEXOL 300 MG/ML  SOLN
10.0000 mL | Freq: Once | INTRAMUSCULAR | Status: AC | PRN
  Administered 2022-06-15: 10 mL via INTRATHECAL

## 2022-06-15 MED ORDER — DIAZEPAM 5 MG PO TABS
ORAL_TABLET | ORAL | Status: AC
  Filled 2022-06-15: qty 2

## 2022-06-15 NOTE — Discharge Instructions (Signed)
Myelogram and Lumbar Puncture Discharge Instructions  Go home and rest quietly for the next 24 hours.  It is important to lie flat for the next 24 hours.  Get up only to go to the restroom.  You may lie in the bed or on a couch on your back, your stomach, your left side or your right side.  You may have one pillow under your head.  You may have pillows between your knees while you are on your side or under your knees while you are on your back.  DO NOT drive today.  Recline the seat as far back as it will go, while still wearing your seat belt, on the way home.  You may get up to go to the bathroom as needed.  You may sit up for 10 minutes to eat.  You may resume your normal diet and medications unless otherwise indicated.  The incidence of headache, nausea, or vomiting is about 5% (one in 20 patients).  If you develop a headache, lie flat and drink plenty of fluids until the headache goes away.  Caffeinated beverages may be helpful.  If you develop severe nausea and vomiting or a headache that does not go away with flat bed rest, call [phone].  You may resume normal activities after your 24 hours of bed rest is over; however, do not exert yourself strongly or do any heavy lifting tomorrow.  Call your physician for a follow-up appointment.  The results of your myelogram will be sent directly to your physician by the following day.  Discharge instructions have been explained to the patient.  The patient, or the person responsible for the patient, fully understands these instructions.   

## 2022-06-15 NOTE — Op Note (Signed)
*   No surgery found * Cervical, thoracic, lumbar Myelogram  PATIENT:  Ashley Mercer is a 82 y.o. female With pain in the upper and lumbar regions PRE-OPERATIVE DIAGNOSIS:  cervicalgia, dorsalgia, lumbago  POST-OPERATIVE DIAGNOSIS:  same  PROCEDURE:  Lumbar Myelogram  SURGEON:  Taegen Lennox  ANESTHESIA:   local LOCAL MEDICATIONS USED:  LIDOCAINE  Procedure Note: [name] is a 82 y.o. female Was taken to the fluoroscopy suite and  positioned prone on the fluoroscopy table. Her back was prepared and draped in a sterile manner. I infiltrated 6 cc into the lumbar region. I then introduced a spinal needle into the thecal sac at the L4/5 interlaminar space. I infiltrated 10cc of Isovue 300 into the thecal sac. Fluoroscopy showed the needle and contrast in the thecal sac. TWAN MASLOW tolerated the procedure well. she Will be taken to CT for evaluation.     PATIENT DISPOSITION:  Short Stay

## 2022-06-15 NOTE — Progress Notes (Signed)
Patient discharged at 1225. Unable to discharge out of Epic.

## 2022-06-20 DIAGNOSIS — I129 Hypertensive chronic kidney disease with stage 1 through stage 4 chronic kidney disease, or unspecified chronic kidney disease: Secondary | ICD-10-CM | POA: Diagnosis not present

## 2022-06-20 DIAGNOSIS — R809 Proteinuria, unspecified: Secondary | ICD-10-CM | POA: Diagnosis not present

## 2022-06-20 DIAGNOSIS — D649 Anemia, unspecified: Secondary | ICD-10-CM | POA: Diagnosis not present

## 2022-06-20 DIAGNOSIS — E669 Obesity, unspecified: Secondary | ICD-10-CM | POA: Diagnosis not present

## 2022-06-25 DIAGNOSIS — Z6836 Body mass index (BMI) 36.0-36.9, adult: Secondary | ICD-10-CM | POA: Diagnosis not present

## 2022-06-25 DIAGNOSIS — M546 Pain in thoracic spine: Secondary | ICD-10-CM | POA: Diagnosis not present

## 2022-06-26 ENCOUNTER — Other Ambulatory Visit: Payer: Self-pay | Admitting: Neurosurgery

## 2022-07-25 NOTE — Pre-Procedure Instructions (Signed)
Surgical Instructions    Your procedure is scheduled on August 03, 2022.  Report to [facility] Main Entrance "A" at 6:30 A.M., then check in with the Admitting office.  Call this number if you have problems the morning of surgery:  [phone]  If you have any questions prior to your surgery date call [phone]: Open Monday-Friday 8am-4pm If you experience any cold or flu symptoms such as cough, fever, chills, shortness of breath, etc. between now and your scheduled surgery, please notify us at the above number.     Remember:  Do not eat after midnight the night before your surgery  You may drink clear liquids until 5:30 AM the morning of your surgery.   Clear liquids allowed are: Water, Non-Citrus Juices (without pulp), Carbonated Beverages, Clear Tea, Black Coffee Only (NO MILK, CREAM OR POWDERED CREAMER of any kind), and Gatorade.     Take these medicines the morning of surgery with A SIP OF WATER:  amLODipine (NORVASC)   colesevelam (WELCHOL)   docusate sodium (COLACE)   gemfibrozil (LOPID)   levothyroxine (SYNTHROID, LEVOTHROID)   metoprolol succinate (TOPROL-XL)   pregabalin (LYRICA)     May take these medicines IF NEEDED:  acetaminophen (TYLENOL)   cyclobenzaprine (FLEXERIL)   famotidine (PEPCID)    Follow your surgeon's instructions on when to stop Aspirin.  If no instructions were given by your surgeon then you will need to call the office to get those instructions.     As of today, STOP taking any Aleve, Naproxen, Ibuprofen, Motrin, Advil, Goody's, BC's, all herbal medications, fish oil, and all vitamins.                     Do NOT Smoke (Tobacco/Vaping) for 24 hours prior to your procedure.  If you use a CPAP at night, you may bring your mask/headgear for your overnight stay.   Contacts, glasses, piercing's, hearing aid's, dentures or partials may not be worn into surgery, please bring cases for these belongings.    For patients admitted to the hospital,  discharge time will be determined by your treatment team.   Patients discharged the day of surgery will not be allowed to drive home, and someone needs to stay with them for 24 hours.  SURGICAL WAITING ROOM VISITATION Patients having surgery or a procedure may have no more than 2 support people in the waiting area - these visitors may rotate.   Children under the age of 24 must have an adult with them who is not the patient. If the patient needs to stay at the hospital during part of their recovery, the visitor guidelines for inpatient rooms apply. Pre-op nurse will coordinate an appropriate time for 1 support person to accompany patient in pre-op.  This support person may not rotate.   Please refer to the [facility] website for the visitor guidelines for Inpatients (after your surgery is over and you are in a regular room).   If you received a COVID test during your pre-op visit  it is requested that you wear a mask when out in public, stay away from anyone that may not be feeling well and notify your surgeon if you develop symptoms. If you have been in contact with anyone that has tested positive in the last 10 days please notify you surgeon.    Pre-operative 5 CHG Bath Instructions   You can play a key role in reducing the risk of infection after surgery. Your skin needs to be as  free of germs as possible. You can reduce the number of germs on your skin by washing with CHG (chlorhexidine gluconate) soap before surgery. CHG is an antiseptic soap that kills germs and continues to kill germs even after washing.   DO NOT use if you have an allergy to chlorhexidine/CHG or antibacterial soaps. If your skin becomes reddened or irritated, stop using the CHG and notify one of our RNs at [phone].   Please shower with the CHG soap starting 4 days before surgery using the following schedule:     Please keep in mind the following:  DO NOT shave, including legs and underarms, starting the day of  your first shower.   You may shave your face at any point before/day of surgery.  Place clean sheets on your bed the day you start using CHG soap. Use a clean washcloth (not used since being washed) for each shower. DO NOT sleep with pets once you start using the CHG.   CHG Shower Instructions:  If you choose to wash your hair and private area, wash first with your normal shampoo/soap.  After you use shampoo/soap, rinse your hair and body thoroughly to remove shampoo/soap residue.  Turn the water OFF and apply about 3 tablespoons (45 ml) of CHG soap to a CLEAN washcloth.  Apply CHG soap ONLY FROM YOUR NECK DOWN TO YOUR TOES (washing for 3-5 minutes)  DO NOT use CHG soap on face, private areas, open wounds, or sores.  Pay special attention to the area where your surgery is being performed.  If you are having back surgery, having someone wash your back for you may be helpful. Wait 2 minutes after CHG soap is applied, then you may rinse off the CHG soap.  Pat dry with a clean towel  Put on clean clothes/pajamas   If you choose to wear lotion, please use ONLY the CHG-compatible lotions on the back of this paper.     Additional instructions for the day of surgery: DO NOT APPLY any lotions, deodorants, cologne, or perfumes.  Do not wear jewelry or makeup  Do not wear nail polish, gel polish, artificial nails, or any other type of covering on natural nails (fingers and toes) Do not bring valuables to the hospital. [facility] is not responsible for any belongings or valuables. Put on clean/comfortable clothes.  Brush your teeth.  Ask your nurse before applying any prescription medications to the skin.      CHG Compatible Lotions   Aveeno Moisturizing lotion  Cetaphil Moisturizing Cream  Cetaphil Moisturizing Lotion  Clairol Herbal Essence Moisturizing Lotion, Dry Skin  Clairol Herbal Essence Moisturizing Lotion, Extra Dry Skin  Clairol Herbal Essence Moisturizing Lotion, Normal Skin   Curel Age Defying Therapeutic Moisturizing Lotion with Alpha Hydroxy  Curel Extreme Care Body Lotion  Curel Soothing Hands Moisturizing Hand Lotion  Curel Therapeutic Moisturizing Cream, Fragrance-Free  Curel Therapeutic Moisturizing Lotion, Fragrance-Free  Curel Therapeutic Moisturizing Lotion, Original Formula  Eucerin Daily Replenishing Lotion  Eucerin Dry Skin Therapy Plus Alpha Hydroxy Crme  Eucerin Dry Skin Therapy Plus Alpha Hydroxy Lotion  Eucerin Original Crme  Eucerin Original Lotion  Eucerin Plus Crme Eucerin Plus Lotion  Eucerin TriLipid Replenishing Lotion  Keri Anti-Bacterial Hand Lotion  Keri Deep Conditioning Original Lotion Dry Skin Formula Softly Scented  Keri Deep Conditioning Original Lotion, Fragrance Free Sensitive Skin Formula  Keri Lotion Fast Absorbing Fragrance Free Sensitive Skin Formula  Keri Lotion Fast Absorbing Softly Scented Dry Skin Formula  Keri Original Lotion  Keri Skin Renewal Lotion Keri Silky Smooth Lotion  Keri Silky Smooth Sensitive Skin Lotion  Nivea Body Creamy Conditioning Oil  Nivea Body Extra Enriched Teacher, adult education Moisturizing Lotion Nivea Crme  Nivea Skin Firming Lotion  NutraDerm 30 Skin Lotion  NutraDerm Skin Lotion  NutraDerm Therapeutic Skin Cream  NutraDerm Therapeutic Skin Lotion  ProShield Protective Hand Cream  Provon moisturizing lotion  Please read over the following fact sheets that you were given.

## 2022-07-26 ENCOUNTER — Other Ambulatory Visit: Payer: Self-pay

## 2022-07-26 ENCOUNTER — Encounter (HOSPITAL_COMMUNITY)
Admission: RE | Admit: 2022-07-26 | Discharge: 2022-07-26 | Disposition: A | Discharge status: Home / Self Care | Payer: Medicare Other | Source: Ambulatory Visit | Attending: Neurosurgery | Admitting: Neurosurgery

## 2022-07-26 ENCOUNTER — Encounter (HOSPITAL_COMMUNITY): Payer: Self-pay

## 2022-07-26 VITALS — BP 128/80 | HR 64 | Temp 97.8°F | Resp 18 | Ht 59.0 in | Wt 202.0 lb

## 2022-07-26 DIAGNOSIS — I2510 Atherosclerotic heart disease of native coronary artery without angina pectoris: Secondary | ICD-10-CM

## 2022-07-26 DIAGNOSIS — Z01818 Encounter for other preprocedural examination: Secondary | ICD-10-CM | POA: Diagnosis not present

## 2022-07-26 HISTORY — DX: Myoneural disorder, unspecified: G70.9

## 2022-07-26 HISTORY — DX: Peripheral vascular disease, unspecified: I73.9

## 2022-07-26 HISTORY — DX: Personal history of other diseases of the digestive system: Z87.19

## 2022-07-26 HISTORY — DX: Left bundle-branch block, unspecified: I44.7

## 2022-07-26 LAB — BASIC METABOLIC PANEL
Anion gap: 12 (ref 5–15)
BUN: 10 mg/dL (ref 8–23)
CO2: 24 mmol/L (ref 22–32)
Calcium: 9.4 mg/dL (ref 8.9–10.3)
Chloride: 103 mmol/L (ref 98–111)
Creatinine, Ser: 1.02 mg/dL — ABNORMAL HIGH (ref 0.44–1.00)
GFR, Estimated: 55 mL/min — ABNORMAL LOW (ref >60)
Glucose, Bld: 125 mg/dL — ABNORMAL HIGH (ref 70–99)
Potassium: 3.7 mmol/L (ref 3.5–5.1)
Sodium: 139 mmol/L (ref 135–145)

## 2022-07-26 LAB — CBC
HCT: 40.4 % (ref 36.0–46.0)
Hemoglobin: 12.3 g/dL (ref 12.0–15.0)
MCH: 25.6 pg — ABNORMAL LOW (ref 26.0–34.0)
MCHC: 30.4 g/dL (ref 30.0–36.0)
MCV: 84 fL (ref 80.0–100.0)
Platelets: 383 10*3/uL (ref 150–400)
RBC: 4.81 MIL/uL (ref 3.87–5.11)
RDW: 15.4 % (ref 11.5–15.5)
WBC: 7.6 10*3/uL (ref 4.0–10.5)
nRBC: 0 % (ref 0.0–0.2)

## 2022-07-26 LAB — TYPE AND SCREEN
ABO/RH(D): A NEG
Antibody Screen: NEGATIVE

## 2022-07-26 LAB — SURGICAL PCR SCREEN
MRSA, PCR: NEGATIVE
Staphylococcus aureus: NEGATIVE

## 2022-07-26 NOTE — Progress Notes (Signed)
Shanda Bumps, OR scheduler at Dr. Sueanne Margarita office, called about consent not matching OR posting. No answer. Voicemail left with pt name, DOB, Surgeon, and Surgery date. OR posting notes the spine levels, but consent does not. Call back number left with Jessica.

## 2022-07-26 NOTE — Progress Notes (Signed)
PCP - Used to see Dr. Elias Else, but he recently retired. Pt now sees Magnus Sinning who is in the same Nurse, adult - Per pt, many years ago around 2006 for heart block. No testing done. Discussed with Antionette Poles, PA-C  PPM/ICD - Denies Device Orders - n/a Rep Notified - n/a  Chest x-ray - Denies EKG - 07/26/2022 Stress Test - Denies ECHO - Denies Cardiac Cath - Denies  Sleep Study - Denies CPAP - n/a  No DM (Pt was Pre-DM in 2022 but lost weight and no longer met criteria for Pre-DM)  Last dose of GLP1 agonist- n/a GLP1 instructions: n/a  Blood Thinner Instructions: n/a Aspirin Instructions: Per surgeon instructions, pt stopped ASA 2 weeks prior to surgery. Her last dose was July 7th.  ERAS Protcol - Clear liquids until 0530 morning of surgery PRE-SURGERY Ensure or G2- n/a  COVID TEST- n/a   Anesthesia review: Yes. Abnormal EKG. Discussed with Antionette Poles, PA-C   Patient denies shortness of breath, fever, cough and chest pain at PAT appointment. Pt denies any respiratory illness/infection in the last two months.   All instructions explained to the patient, with a verbal understanding of the material. Patient agrees to go over the instructions while at home for a better understanding. Patient also instructed to self quarantine after being tested for COVID-19. The opportunity to ask questions was provided.

## 2022-07-27 ENCOUNTER — Encounter (HOSPITAL_COMMUNITY): Payer: Self-pay | Admitting: Physician Assistant

## 2022-07-27 NOTE — Progress Notes (Signed)
Anesthesia Chart Review:  82 year old female with pertinent history including HTN, hypothyroid, GERD, moderate size hiatal hernia, chronic left bundle branch block (present since at least 2002), splenic artery aneurysm (stable 1.3 cm by CTA 04/30/2022).  Hx of C3-7 ACDF.  Preop labs reviewed, unremarkable.   EKG 07/26/22: NSR. Rate 62. LBBB.  CTA abdomen/pelvis 04/30/22: IMPRESSION: VASCULAR   1. Stable 1.3 cm saccular splenic artery aneurysm. No significant change since 2023. Recommend continued annual surveillance. 2.  Aortic Atherosclerosis (ICD10-I70.0).   NON-VASCULAR   1. Moderate sized hiatal hernia. 2. Small hypervascular foci at the hepatic dome. These appear to be relatively stable since 2023 and probably represent small incidental vascular lesions. No additional workup is needed unless there is concern for neoplastic disease.    Ashley Mercer [facility] Short Stay Center/Anesthesiology Phone [phone] 07/27/2022 10:47 AM

## 2022-07-27 NOTE — Anesthesia Preprocedure Evaluation (Deleted)
Anesthesia Evaluation    Airway        Dental   Pulmonary former smoker          Cardiovascular hypertension,      Neuro/Psych    GI/Hepatic   Endo/Other    Renal/GU      Musculoskeletal   Abdominal   Peds  Hematology   Anesthesia Other Findings   Reproductive/Obstetrics                              Anesthesia Physical Anesthesia Plan  ASA:   Anesthesia Plan:    Post-op Pain Management:    Induction:   PONV Risk Score and Plan:   Airway Management Planned:   Additional Equipment:   Intra-op Plan:   Post-operative Plan:   Informed Consent:   Plan Discussed with:   Anesthesia Plan Comments: (PAT note by Antionette Poles, PA-C: 82 year old female with pertinent history including HTN, hypothyroid, GERD, moderate size hiatal hernia, chronic left bundle branch block (present since at least 2002), splenic artery aneurysm (stable 1.3 cm by CTA 04/30/2022).  Hx of C3-7 ACDF.  Preop labs reviewed, unremarkable.   EKG 07/26/22: NSR. Rate 62. LBBB.  CTA abdomen/pelvis 04/30/22: IMPRESSION: VASCULAR   1. Stable 1.3 cm saccular splenic artery aneurysm. No significant change since 2023. Recommend continued annual surveillance. 2.  Aortic Atherosclerosis (ICD10-I70.0).   NON-VASCULAR   1. Moderate sized hiatal hernia. 2. Small hypervascular foci at the hepatic dome. These appear to be relatively stable since 2023 and probably represent small incidental vascular lesions. No additional workup is needed unless there is concern for neoplastic disease.  )         Anesthesia Quick Evaluation

## 2022-08-16 ENCOUNTER — Encounter (HOSPITAL_COMMUNITY): Payer: Self-pay | Admitting: Neurosurgery

## 2022-08-16 ENCOUNTER — Other Ambulatory Visit: Payer: Self-pay

## 2022-08-16 NOTE — Progress Notes (Signed)
PCP - Magnus Sinning, MD Cardiologist - denies  PPM/ICD - denies  EKG - 07/26/22  CPAP - n/a  Fasting Blood Sugar - n/a  Blood Thinner Instructions: n/a Aspirin Instructions - patient stopped Aspirin 2 weeks ago Patient was instructed: As of today, STOP taking any Aspirin (unless otherwise instructed by your surgeon) Aleve, Naproxen, Ibuprofen, Motrin, Advil, Goody's, BC's, all herbal medications, fish oil, and all vitamins.  ERAS Protcol - yes, until 07:30 o'clock  COVID TEST- n/a  Anesthesia review: yes - the chart was reviewed by Antionette Poles, PA-C on 07/26/22. No changes in the patient's conditions since that date.  Patient verbally denies any shortness of breath, fever, cough and chest pain during phone call   -------------  SDW INSTRUCTIONS given:  Your procedure is scheduled on Friday, August 2nd, 2024.  Report to [facility] Main Entrance "A" at 08:10 A.M., and check in at the Admitting office.  Call this number if you have problems the morning of surgery:  [phone]   Remember:  Do not eat after midnight the night before your surgery  You may drink clear liquids until 07:30 the morning of your surgery.   Clear liquids allowed are: Water, Non-Citrus Juices (without pulp), Carbonated Beverages, Clear Tea, Black Coffee Only, and Gatorade    Take these medicines the morning of surgery with A SIP OF WATER:  Amlodipine, Gemfibrozil, Synthroid, Metoprolol, Lyrica  PRN: Tylenol, Pepcid, Flexeril   The day of surgery:                   Do not wear jewelry, make up, or nail polish            Do not wear lotions, powders, perfumes, or deodorant.            Do not shave 48 hours prior to surgery.              Do not bring valuables to the hospital.            [facility] is not responsible for any belongings or valuables.  Do NOT Smoke (Tobacco/Vaping) 24 hours prior to your procedure If you use a CPAP at night, you may bring all equipment for your overnight stay.    Contacts, glasses, dentures or bridgework may not be worn into surgery.      For patients admitted to the hospital, discharge time will be determined by your treatment team.   Patients discharged the day of surgery will not be allowed to drive home, and someone needs to stay with them for 24 hours.    Special instructions:   [place]- Preparing For Surgery  Before surgery, you can play an important role. Because skin is not sterile, your skin needs to be as free of germs as possible. You can reduce the number of germs on your skin by washing with CHG (chlorahexidine gluconate) Soap before surgery.  CHG is an antiseptic cleaner which kills germs and bonds with the skin to continue killing germs even after washing.    Oral Hygiene is also important to reduce your risk of infection.  Remember - BRUSH YOUR TEETH THE MORNING OF SURGERY WITH YOUR REGULAR TOOTHPASTE  Please do not use if you have an allergy to CHG or antibacterial soaps. If your skin becomes reddened/irritated stop using the CHG.  Do not shave (including legs and underarms) for at least 48 hours prior to first CHG shower. It is OK to shave your face.  Please follow  these instructions carefully.   Shower the NIGHT BEFORE SURGERY and the MORNING OF SURGERY with DIAL Soap.   Pat yourself dry with a CLEAN TOWEL.  Wear CLEAN PAJAMAS to bed the night before surgery  Place CLEAN SHEETS on your bed the night of your first shower and DO NOT SLEEP WITH PETS.   Day of Surgery: Please shower morning of surgery  Wear Clean/Comfortable clothing the morning of surgery Do not apply any deodorants/lotions.   Remember to brush your teeth WITH YOUR REGULAR TOOTHPASTE.   Questions were answered. Patient verbalized understanding of instructions.

## 2022-08-17 ENCOUNTER — Encounter (HOSPITAL_COMMUNITY)
Admission: RE | Disposition: A | Discharge status: Home / Self Care | Payer: Self-pay | Source: Home / Self Care | Attending: Neurosurgery

## 2022-08-17 ENCOUNTER — Inpatient Hospital Stay (HOSPITAL_COMMUNITY)
Admission: RE | Admit: 2022-08-17 | Discharge: 2022-08-17 | DRG: 460 | Disposition: A | Discharge status: Home / Self Care | Payer: Medicare Other | Attending: Neurosurgery | Admitting: Neurosurgery

## 2022-08-17 ENCOUNTER — Inpatient Hospital Stay (HOSPITAL_COMMUNITY): Payer: Medicare Other | Admitting: Anesthesiology

## 2022-08-17 ENCOUNTER — Other Ambulatory Visit: Payer: Self-pay

## 2022-08-17 ENCOUNTER — Encounter (HOSPITAL_COMMUNITY): Payer: Self-pay | Admitting: Neurosurgery

## 2022-08-17 ENCOUNTER — Inpatient Hospital Stay (HOSPITAL_COMMUNITY): Payer: Medicare Other

## 2022-08-17 DIAGNOSIS — Z7982 Long term (current) use of aspirin: Secondary | ICD-10-CM

## 2022-08-17 DIAGNOSIS — S32009K Unspecified fracture of unspecified lumbar vertebra, subsequent encounter for fracture with nonunion: Principal | ICD-10-CM | POA: Diagnosis present

## 2022-08-17 DIAGNOSIS — K219 Gastro-esophageal reflux disease without esophagitis: Secondary | ICD-10-CM | POA: Diagnosis present

## 2022-08-17 DIAGNOSIS — Z833 Family history of diabetes mellitus: Secondary | ICD-10-CM | POA: Diagnosis not present

## 2022-08-17 DIAGNOSIS — T8484XA Pain due to internal orthopedic prosthetic devices, implants and grafts, initial encounter: Secondary | ICD-10-CM | POA: Diagnosis present

## 2022-08-17 DIAGNOSIS — I739 Peripheral vascular disease, unspecified: Secondary | ICD-10-CM | POA: Diagnosis present

## 2022-08-17 DIAGNOSIS — M545 Low back pain, unspecified: Secondary | ICD-10-CM | POA: Diagnosis not present

## 2022-08-17 DIAGNOSIS — E7800 Pure hypercholesterolemia, unspecified: Secondary | ICD-10-CM | POA: Diagnosis present

## 2022-08-17 DIAGNOSIS — Z8249 Family history of ischemic heart disease and other diseases of the circulatory system: Secondary | ICD-10-CM

## 2022-08-17 DIAGNOSIS — E039 Hypothyroidism, unspecified: Secondary | ICD-10-CM | POA: Diagnosis not present

## 2022-08-17 DIAGNOSIS — Z79890 Hormone replacement therapy (postmenopausal): Secondary | ICD-10-CM

## 2022-08-17 DIAGNOSIS — Y831 Surgical operation with implant of artificial internal device as the cause of abnormal reaction of the patient, or of later complication, without mention of misadventure at the time of the procedure: Secondary | ICD-10-CM | POA: Diagnosis present

## 2022-08-17 DIAGNOSIS — M960 Pseudarthrosis after fusion or arthrodesis: Secondary | ICD-10-CM

## 2022-08-17 DIAGNOSIS — T84038A Mechanical loosening of other internal prosthetic joint, initial encounter: Principal | ICD-10-CM | POA: Diagnosis present

## 2022-08-17 DIAGNOSIS — I10 Essential (primary) hypertension: Secondary | ICD-10-CM | POA: Diagnosis present

## 2022-08-17 DIAGNOSIS — Z981 Arthrodesis status: Secondary | ICD-10-CM | POA: Diagnosis not present

## 2022-08-17 DIAGNOSIS — Z87891 Personal history of nicotine dependence: Secondary | ICD-10-CM | POA: Diagnosis not present

## 2022-08-17 DIAGNOSIS — Z79899 Other long term (current) drug therapy: Secondary | ICD-10-CM

## 2022-08-17 DIAGNOSIS — G8929 Other chronic pain: Secondary | ICD-10-CM | POA: Diagnosis present

## 2022-08-17 DIAGNOSIS — Z96653 Presence of artificial knee joint, bilateral: Secondary | ICD-10-CM | POA: Diagnosis present

## 2022-08-17 DIAGNOSIS — Z6839 Body mass index (BMI) 39.0-39.9, adult: Secondary | ICD-10-CM | POA: Diagnosis not present

## 2022-08-17 DIAGNOSIS — F419 Anxiety disorder, unspecified: Secondary | ICD-10-CM | POA: Diagnosis present

## 2022-08-17 DIAGNOSIS — Y838 Other surgical procedures as the cause of abnormal reaction of the patient, or of later complication, without mention of misadventure at the time of the procedure: Secondary | ICD-10-CM | POA: Diagnosis present

## 2022-08-17 HISTORY — PX: HARDWARE REMOVAL: SHX979

## 2022-08-17 LAB — TYPE AND SCREEN
ABO/RH(D): A NEG
Antibody Screen: NEGATIVE

## 2022-08-17 SURGERY — REMOVAL, HARDWARE
Anesthesia: General | Site: Spine Lumbar

## 2022-08-17 MED ORDER — ONDANSETRON HCL 4 MG/2ML IJ SOLN
INTRAMUSCULAR | Status: AC
  Filled 2022-08-17: qty 2

## 2022-08-17 MED ORDER — 0.9 % SODIUM CHLORIDE (POUR BTL) OPTIME
TOPICAL | Status: DC | PRN
  Administered 2022-08-17 (×2): 1000 mL

## 2022-08-17 MED ORDER — ONDANSETRON HCL 4 MG/2ML IJ SOLN: 4.0000 mg | Freq: Once | INTRAMUSCULAR | Status: DC | PRN

## 2022-08-17 MED ORDER — HYDROMORPHONE HCL 1 MG/ML IJ SOLN
INTRAMUSCULAR | Status: AC
  Filled 2022-08-17: qty 1

## 2022-08-17 MED ORDER — SODIUM CHLORIDE 0.9% FLUSH: 3.0000 mL | Freq: Two times a day (BID) | INTRAVENOUS | Status: DC

## 2022-08-17 MED ORDER — ONDANSETRON HCL 4 MG/2ML IJ SOLN: 4.0000 mg | Freq: Four times a day (QID) | INTRAMUSCULAR | Status: DC | PRN

## 2022-08-17 MED ORDER — FUROSEMIDE 20 MG PO TABS: 20.0000 mg | ORAL_TABLET | Freq: Every day | ORAL | Status: DC | PRN

## 2022-08-17 MED ORDER — DEXAMETHASONE SODIUM PHOSPHATE 10 MG/ML IJ SOLN
INTRAMUSCULAR | Status: DC | PRN
  Administered 2022-08-17: 5 mg via INTRAVENOUS

## 2022-08-17 MED ORDER — AMLODIPINE BESYLATE 5 MG PO TABS: 5.0000 mg | ORAL_TABLET | Freq: Every day | ORAL | Status: DC

## 2022-08-17 MED ORDER — FAMOTIDINE 20 MG PO TABS: 40.0000 mg | ORAL_TABLET | Freq: Every day | ORAL | Status: DC | PRN

## 2022-08-17 MED ORDER — LIDOCAINE 2% (20 MG/ML) 5 ML SYRINGE
INTRAMUSCULAR | Status: DC | PRN
  Administered 2022-08-17: 100 mg via INTRAVENOUS

## 2022-08-17 MED ORDER — CALCIUM CARBONATE 1250 (500 CA) MG PO TABS: 1.0000 | ORAL_TABLET | Freq: Every day | ORAL | Status: DC

## 2022-08-17 MED ORDER — ONDANSETRON HCL 4 MG/2ML IJ SOLN
INTRAMUSCULAR | Status: DC | PRN
  Administered 2022-08-17: 4 mg via INTRAVENOUS

## 2022-08-17 MED ORDER — BUPIVACAINE HCL (PF) 0.5 % IJ SOLN
INTRAMUSCULAR | Status: AC
  Filled 2022-08-17: qty 30

## 2022-08-17 MED ORDER — LACTATED RINGERS IV SOLN: INTRAVENOUS | Status: DC

## 2022-08-17 MED ORDER — PROPOFOL 10 MG/ML IV BOLUS
INTRAVENOUS | Status: AC
  Filled 2022-08-17: qty 20

## 2022-08-17 MED ORDER — LIDOCAINE-EPINEPHRINE 0.5 %-1:200000 IJ SOLN
INTRAMUSCULAR | Status: AC
  Filled 2022-08-17: qty 50

## 2022-08-17 MED ORDER — METOPROLOL SUCCINATE ER 100 MG PO TB24: 100.0000 mg | ORAL_TABLET | Freq: Every day | ORAL | Status: DC

## 2022-08-17 MED ORDER — SUGAMMADEX SODIUM 200 MG/2ML IV SOLN
INTRAVENOUS | Status: DC | PRN
  Administered 2022-08-17: 200 mg via INTRAVENOUS

## 2022-08-17 MED ORDER — LOSARTAN POTASSIUM 50 MG PO TABS: 50.0000 mg | ORAL_TABLET | Freq: Every day | ORAL | Status: DC

## 2022-08-17 MED ORDER — HYDROMORPHONE HCL 1 MG/ML IJ SOLN
0.2500 mg | INTRAMUSCULAR | Status: DC | PRN
  Administered 2022-08-17 (×2): 0.25 mg via INTRAVENOUS

## 2022-08-17 MED ORDER — ADULT MULTIVITAMIN W/MINERALS CH: 1.0000 | ORAL_TABLET | Freq: Every day | ORAL | Status: DC

## 2022-08-17 MED ORDER — SODIUM CHLORIDE 0.9 % IV SOLN
250.0000 mL | INTRAVENOUS | Status: DC
  Administered 2022-08-17: 250 mL via INTRAVENOUS

## 2022-08-17 MED ORDER — ACETAMINOPHEN 10 MG/ML IV SOLN: 1000.0000 mg | Freq: Once | INTRAVENOUS | Status: DC | PRN

## 2022-08-17 MED ORDER — POLYSACCHARIDE IRON COMPLEX 150 MG PO CAPS: 150.0000 mg | ORAL_CAPSULE | Freq: Every day | ORAL | Status: DC

## 2022-08-17 MED ORDER — SENNA 8.6 MG PO TABS: 1.0000 | ORAL_TABLET | Freq: Two times a day (BID) | ORAL | Status: DC

## 2022-08-17 MED ORDER — MAGNESIUM OXIDE -MG SUPPLEMENT 400 (240 MG) MG PO TABS: 200.0000 mg | ORAL_TABLET | Freq: Every day | ORAL | Status: DC

## 2022-08-17 MED ORDER — ZOLPIDEM TARTRATE 5 MG PO TABS: 5.0000 mg | ORAL_TABLET | Freq: Every evening | ORAL | Status: DC | PRN

## 2022-08-17 MED ORDER — OXYCODONE HCL 5 MG PO TABS: 10.0000 mg | ORAL_TABLET | ORAL | Status: DC | PRN

## 2022-08-17 MED ORDER — ROCURONIUM BROMIDE 10 MG/ML (PF) SYRINGE
PREFILLED_SYRINGE | INTRAVENOUS | Status: AC
  Filled 2022-08-17: qty 10

## 2022-08-17 MED ORDER — LEVOTHYROXINE SODIUM 100 MCG PO TABS: 100.0000 ug | ORAL_TABLET | Freq: Every day | ORAL | Status: DC

## 2022-08-17 MED ORDER — CALCIUM-MAGNESIUM 500-250 MG PO TABS
ORAL_TABLET | Freq: Every day | ORAL | Status: DC
Start: 2022-08-17 — End: 2022-08-17

## 2022-08-17 MED ORDER — SODIUM CHLORIDE 0.9% FLUSH: 3.0000 mL | INTRAVENOUS | Status: DC | PRN

## 2022-08-17 MED ORDER — CHLORHEXIDINE GLUCONATE CLOTH 2 % EX PADS: 6.0000 | MEDICATED_PAD | Freq: Once | CUTANEOUS | Status: DC

## 2022-08-17 MED ORDER — DULOXETINE HCL 30 MG PO CPEP: 60.0000 mg | ORAL_CAPSULE | Freq: Every day | ORAL | Status: DC

## 2022-08-17 MED ORDER — VASOPRESSIN 20 UNIT/ML IV SOLN
INTRAVENOUS | Status: DC | PRN
Start: 2022-08-17 — End: 2022-08-17
  Administered 2022-08-17: 3 [IU] via INTRAVENOUS
  Administered 2022-08-17: 2 [IU] via INTRAVENOUS
  Administered 2022-08-17 (×2): 1 [IU] via INTRAVENOUS
  Administered 2022-08-17: 2 [IU] via INTRAVENOUS

## 2022-08-17 MED ORDER — POTASSIUM CHLORIDE IN NACL 20-0.9 MEQ/L-% IV SOLN: INTRAVENOUS | Status: DC

## 2022-08-17 MED ORDER — OXYCODONE HCL 5 MG/5ML PO SOLN: 5.0000 mg | Freq: Once | ORAL | Status: DC | PRN

## 2022-08-17 MED ORDER — ACETAMINOPHEN 325 MG PO TABS: 650.0000 mg | ORAL_TABLET | ORAL | Status: DC | PRN

## 2022-08-17 MED ORDER — PHENOL 1.4 % MT LIQD: 1.0000 | OROMUCOSAL | Status: DC | PRN

## 2022-08-17 MED ORDER — MORPHINE SULFATE (PF) 2 MG/ML IV SOLN: 2.0000 mg | INTRAVENOUS | Status: DC | PRN

## 2022-08-17 MED ORDER — ACETAMINOPHEN 500 MG PO TABS
1000.0000 mg | ORAL_TABLET | Freq: Four times a day (QID) | ORAL | Status: DC
  Administered 2022-08-17: 1000 mg via ORAL
  Filled 2022-08-17: qty 2

## 2022-08-17 MED ORDER — CYCLOBENZAPRINE HCL 10 MG PO TABS: 10.0000 mg | ORAL_TABLET | Freq: Three times a day (TID) | ORAL | Status: DC | PRN

## 2022-08-17 MED ORDER — PHENYLEPHRINE 80 MCG/ML (10ML) SYRINGE FOR IV PUSH (FOR BLOOD PRESSURE SUPPORT)
PREFILLED_SYRINGE | INTRAVENOUS | Status: DC | PRN
  Administered 2022-08-17: 160 ug via INTRAVENOUS
  Administered 2022-08-17 (×2): 80 ug via INTRAVENOUS

## 2022-08-17 MED ORDER — EPHEDRINE 5 MG/ML INJ
INTRAVENOUS | Status: AC
  Filled 2022-08-17: qty 5

## 2022-08-17 MED ORDER — FENTANYL CITRATE (PF) 250 MCG/5ML IJ SOLN
INTRAMUSCULAR | Status: DC | PRN
  Administered 2022-08-17 (×3): 50 ug via INTRAVENOUS

## 2022-08-17 MED ORDER — THROMBIN (RECOMBINANT) 5000 UNITS EX SOLR: CUTANEOUS | Status: DC | PRN

## 2022-08-17 MED ORDER — CHLORHEXIDINE GLUCONATE 0.12 % MT SOLN
15.0000 mL | Freq: Once | OROMUCOSAL | Status: AC
  Administered 2022-08-17: 15 mL via OROMUCOSAL
  Filled 2022-08-17: qty 15

## 2022-08-17 MED ORDER — EPHEDRINE SULFATE-NACL 50-0.9 MG/10ML-% IV SOSY
PREFILLED_SYRINGE | INTRAVENOUS | Status: DC | PRN
  Administered 2022-08-17: 10 mg via INTRAVENOUS

## 2022-08-17 MED ORDER — ROCURONIUM BROMIDE 10 MG/ML (PF) SYRINGE
PREFILLED_SYRINGE | INTRAVENOUS | Status: DC | PRN
  Administered 2022-08-17: 80 mg via INTRAVENOUS

## 2022-08-17 MED ORDER — OXYCODONE HCL 5 MG PO TABS: 5.0000 mg | ORAL_TABLET | Freq: Four times a day (QID) | ORAL | 0 refills | Status: AC | PRN

## 2022-08-17 MED ORDER — ALBUMIN HUMAN 5 % IV SOLN
INTRAVENOUS | Status: DC | PRN
Start: 2022-08-17 — End: 2022-08-17

## 2022-08-17 MED ORDER — GEMFIBROZIL 600 MG PO TABS
600.0000 mg | ORAL_TABLET | Freq: Two times a day (BID) | ORAL | Status: DC
  Filled 2022-08-17 (×2): qty 1

## 2022-08-17 MED ORDER — PROPOFOL 10 MG/ML IV BOLUS
INTRAVENOUS | Status: DC | PRN
Start: 2022-08-17 — End: 2022-08-17
  Administered 2022-08-17: 120 mg via INTRAVENOUS

## 2022-08-17 MED ORDER — IPRATROPIUM-ALBUTEROL 0.5-2.5 (3) MG/3ML IN SOLN
RESPIRATORY_TRACT | Status: AC
  Filled 2022-08-17: qty 3

## 2022-08-17 MED ORDER — BUPIVACAINE HCL (PF) 0.5 % IJ SOLN
INTRAMUSCULAR | Status: DC | PRN
  Administered 2022-08-17: 30 mL

## 2022-08-17 MED ORDER — OXYCODONE HCL 5 MG PO TABS: 5.0000 mg | ORAL_TABLET | Freq: Once | ORAL | Status: DC | PRN

## 2022-08-17 MED ORDER — VASOPRESSIN 20 UNIT/ML IV SOLN
INTRAVENOUS | Status: AC
  Filled 2022-08-17: qty 1

## 2022-08-17 MED ORDER — ONDANSETRON HCL 4 MG PO TABS: 4.0000 mg | ORAL_TABLET | Freq: Four times a day (QID) | ORAL | Status: DC | PRN

## 2022-08-17 MED ORDER — ASPIRIN 81 MG PO TBEC: 81.0000 mg | DELAYED_RELEASE_TABLET | Freq: Every day | ORAL | Status: DC

## 2022-08-17 MED ORDER — DOCUSATE SODIUM 100 MG PO CAPS: 100.0000 mg | ORAL_CAPSULE | ORAL | Status: DC

## 2022-08-17 MED ORDER — PREGABALIN 100 MG PO CAPS: 100.0000 mg | ORAL_CAPSULE | Freq: Two times a day (BID) | ORAL | Status: DC

## 2022-08-17 MED ORDER — COLESEVELAM HCL 625 MG PO TABS
625.0000 mg | ORAL_TABLET | Freq: Two times a day (BID) | ORAL | Status: DC
  Filled 2022-08-17 (×2): qty 1

## 2022-08-17 MED ORDER — IPRATROPIUM-ALBUTEROL 0.5-2.5 (3) MG/3ML IN SOLN
3.0000 mL | Freq: Once | RESPIRATORY_TRACT | Status: AC
  Administered 2022-08-17: 3 mL via RESPIRATORY_TRACT

## 2022-08-17 MED ORDER — OXYCODONE HCL 5 MG PO TABS
5.0000 mg | ORAL_TABLET | ORAL | Status: DC | PRN
  Administered 2022-08-17: 5 mg via ORAL
  Filled 2022-08-17: qty 1

## 2022-08-17 MED ORDER — LIDOCAINE 2% (20 MG/ML) 5 ML SYRINGE
INTRAMUSCULAR | Status: AC
  Filled 2022-08-17: qty 5

## 2022-08-17 MED ORDER — THROMBIN 5000 UNITS EX SOLR
CUTANEOUS | Status: AC
  Filled 2022-08-17: qty 10000

## 2022-08-17 MED ORDER — FENTANYL CITRATE (PF) 250 MCG/5ML IJ SOLN
INTRAMUSCULAR | Status: AC
  Filled 2022-08-17: qty 5

## 2022-08-17 MED ORDER — DIAZEPAM 5 MG PO TABS: 5.0000 mg | ORAL_TABLET | Freq: Four times a day (QID) | ORAL | 0 refills | Status: AC | PRN

## 2022-08-17 MED ORDER — MENTHOL 3 MG MT LOZG: 1.0000 | LOZENGE | OROMUCOSAL | Status: DC | PRN

## 2022-08-17 MED ORDER — DEXAMETHASONE SODIUM PHOSPHATE 10 MG/ML IJ SOLN
INTRAMUSCULAR | Status: AC
  Filled 2022-08-17: qty 1

## 2022-08-17 MED ORDER — ACETAMINOPHEN 650 MG RE SUPP: 650.0000 mg | RECTAL | Status: DC | PRN

## 2022-08-17 MED ORDER — LIDOCAINE-EPINEPHRINE 0.5 %-1:200000 IJ SOLN: INTRAMUSCULAR | Status: DC | PRN

## 2022-08-17 MED ORDER — ORAL CARE MOUTH RINSE: 15.0000 mL | Freq: Once | OROMUCOSAL | Status: AC

## 2022-08-17 MED ORDER — CEFAZOLIN SODIUM-DEXTROSE 2-4 GM/100ML-% IV SOLN
2.0000 g | INTRAVENOUS | Status: AC
  Administered 2022-08-17: 2 g via INTRAVENOUS
  Filled 2022-08-17: qty 100

## 2022-08-17 SURGICAL SUPPLY — 47 items
ADH SKN CLS APL DERMABOND .7 (GAUZE/BANDAGES/DRESSINGS) ×1
APL SKNCLS STERI-STRIP NONHPOA (GAUZE/BANDAGES/DRESSINGS)
BAG COUNTER SPONGE SURGICOUNT (BAG) ×1 IMPLANT
BAG SPNG CNTER NS LX DISP (BAG) ×1
BENZOIN TINCTURE PRP APPL 2/3 (GAUZE/BANDAGES/DRESSINGS) IMPLANT
BLADE CLIPPER SURG (BLADE) IMPLANT
BUR MATCHSTICK NEURO 3.0 LAGG (BURR) ×1 IMPLANT
CANISTER SUCT 3000ML PPV (MISCELLANEOUS) ×1 IMPLANT
DERMABOND ADVANCED .7 DNX12 (GAUZE/BANDAGES/DRESSINGS) ×1 IMPLANT
DRAPE LAPAROTOMY 100X72X124 (DRAPES) ×1 IMPLANT
DRAPE MICROSCOPE SLANT 54X150 (MISCELLANEOUS) IMPLANT
DRAPE SURG 17X23 STRL (DRAPES) ×1 IMPLANT
DRSG OPSITE POSTOP 4X8 (GAUZE/BANDAGES/DRESSINGS) IMPLANT
DURAPREP 26ML APPLICATOR (WOUND CARE) ×1 IMPLANT
ELECT REM PT RETURN 9FT ADLT (ELECTROSURGICAL) ×1
ELECTRODE REM PT RTRN 9FT ADLT (ELECTROSURGICAL) ×1 IMPLANT
GAUZE 4X4 16PLY ~~LOC~~+RFID DBL (SPONGE) IMPLANT
GAUZE SPONGE 4X4 12PLY STRL (GAUZE/BANDAGES/DRESSINGS) IMPLANT
GLOVE ECLIPSE 6.5 STRL STRAW (GLOVE) ×1 IMPLANT
GLOVE EXAM NITRILE XL STR (GLOVE) IMPLANT
GOWN STRL REUS W/ TWL LRG LVL3 (GOWN DISPOSABLE) ×1 IMPLANT
GOWN STRL REUS W/ TWL XL LVL3 (GOWN DISPOSABLE) IMPLANT
GOWN STRL REUS W/TWL 2XL LVL3 (GOWN DISPOSABLE) IMPLANT
GOWN STRL REUS W/TWL LRG LVL3 (GOWN DISPOSABLE) ×1
GOWN STRL REUS W/TWL XL LVL3 (GOWN DISPOSABLE)
GRAFT BONE PROTEIOS LRG 5CC (Orthopedic Implant) IMPLANT
GRAFT TRINITY ELITE LGE HUMAN (Tissue) IMPLANT
KIT BASIN OR (CUSTOM PROCEDURE TRAY) ×1 IMPLANT
KIT TURNOVER KIT B (KITS) ×1 IMPLANT
NDL HYPO 25X1 1.5 SAFETY (NEEDLE) ×1 IMPLANT
NDL SPNL 18GX3.5 QUINCKE PK (NEEDLE) IMPLANT
NEEDLE HYPO 25X1 1.5 SAFETY (NEEDLE) ×1 IMPLANT
NEEDLE SPNL 18GX3.5 QUINCKE PK (NEEDLE) IMPLANT
NS IRRIG 1000ML POUR BTL (IV SOLUTION) ×1 IMPLANT
PACK LAMINECTOMY NEURO (CUSTOM PROCEDURE TRAY) ×1 IMPLANT
PAD ARMBOARD 7.5X6 YLW CONV (MISCELLANEOUS) ×3 IMPLANT
SPIKE FLUID TRANSFER (MISCELLANEOUS) ×1 IMPLANT
SPONGE SURGIFOAM ABS GEL SZ50 (HEMOSTASIS) ×1 IMPLANT
SPONGE T-LAP 4X18 ~~LOC~~+RFID (SPONGE) IMPLANT
STRIP CLOSURE SKIN 1/2X4 (GAUZE/BANDAGES/DRESSINGS) IMPLANT
SUT VIC AB 0 CT1 18XCR BRD8 (SUTURE) ×1 IMPLANT
SUT VIC AB 0 CT1 8-18 (SUTURE) ×2
SUT VIC AB 2-0 CT1 18 (SUTURE) ×1 IMPLANT
SUT VIC AB 3-0 SH 8-18 (SUTURE) ×1 IMPLANT
TOWEL GREEN STERILE (TOWEL DISPOSABLE) ×1 IMPLANT
TOWEL GREEN STERILE FF (TOWEL DISPOSABLE) ×1 IMPLANT
WATER STERILE IRR 1000ML POUR (IV SOLUTION) ×1 IMPLANT

## 2022-08-17 NOTE — Plan of Care (Signed)
°  Problem: Education: Goal: Ability to verbalize activity precautions or restrictions will improve Outcome: Completed/Met Goal: Knowledge of the prescribed therapeutic regimen will improve Outcome: Completed/Met Goal: Understanding of discharge needs will improve Outcome: Completed/Met   Problem: Activity: Goal: Ability to avoid complications of mobility impairment will improve Outcome: Completed/Met Goal: Ability to tolerate increased activity will improve Outcome: Completed/Met Goal: Will remain free from falls Outcome: Completed/Met   Problem: Bowel/Gastric: Goal: Gastrointestinal status for postoperative course will improve Outcome: Completed/Met   Problem: Clinical Measurements: Goal: Ability to maintain clinical measurements within normal limits will improve Outcome: Completed/Met Goal: Postoperative complications will be avoided or minimized Outcome: Completed/Met Goal: Diagnostic test results will improve Outcome: Completed/Met   Problem: Pain Management: Goal: Pain level will decrease Outcome: Completed/Met   Problem: Skin Integrity: Goal: Will show signs of wound healing Outcome: Completed/Met   Problem: Health Behavior/Discharge Planning: Goal: Identification of resources available to assist in meeting health care needs will improve Outcome: Completed/Met   Problem: Bladder/Genitourinary: Goal: Urinary functional status for postoperative course will improve Outcome: Completed/Met

## 2022-08-17 NOTE — H&P (Signed)
Ashley Mercer is an 82 y.o. female.   Chief Complaint: low back pain HPI: with history of lumbar fusion. Pedicle screws appear loose, and may be source of pain. Will remove them today  Past Medical History:  Diagnosis Date   Anemia    iron deficiency   Anxiety    Arthritis    Cervical spinal stenosis    Chronic back pain    Chronic kidney disease    protein in urine   Dry skin    Eczema    Fatty tumor    Right wrist   Frequent UTI    GERD (gastroesophageal reflux disease)    Heart block AV first degree    High cholesterol    History of blood in urine    Pt was seen by [facility]; scant amount; no current issues   History of hiatal hernia    Hypertension    Hypothyroidism    Left bundle branch block (LBBB)    Lumbar spinal stenosis    Neuromuscular disorder (HCC)    Neuropathy   Numbness and tingling in hands    Bilateral   Peripheral vascular disease (HCC)    Splenic Artery Aneurysm   Pneumonia    Pre-diabetes 2022   Pt lost weight and is no longer Pre-DM   Proteinuria 2020   seeing a kidney specialist   Thyroid condition     Past Surgical History:  Procedure Laterality Date   ANTERIOR CERVICAL DECOMP/DISCECTOMY FUSION N/A 05/01/2019   Procedure: Cervical Three-Four Anterior cervical decompression/discectomy/fusion;  Surgeon: Coletta Memos, MD;  Location[facility] OR;  Service: Neurosurgery;  Laterality: N/A;  anterior   APPENDECTOMY  1984   BREAST BIOPSY Left 2006   benign   BREAST EXCISIONAL BIOPSY Left 2006   papilloma   CERVICAL SPINE SURGERY  01/28/2009   COLONOSCOPY W/ POLYPECTOMY  02/2022   EYE SURGERY Bilateral 01/2017   cataract surgery   JOINT REPLACEMENT     Right knee   LUMBAR LAMINECTOMY/DECOMPRESSION MICRODISCECTOMY N/A 05/10/2017   Procedure: LAMINECTOMY LUMBAR 5- SACRAL 1;  Surgeon: Coletta Memos, MD;  Location: [facility] OR;  Service: Neurosurgery;  Laterality: N/A;  LAMINECTOMY LUMBAR 5- SACRAL 1   LUMBAR SPINE SURGERY  2018   THYROIDECTOMY   1971   TONSILLECTOMY     TOTAL ABDOMINAL HYSTERECTOMY  1986   TOTAL KNEE ARTHROPLASTY Right 03/09/2019   Procedure: TOTAL KNEE ARTHROPLASTY;  Surgeon: Ollen Gross, MD;  Location: [facility] ORS;  Service: Orthopedics;  Laterality: Right;    TOTAL KNEE ARTHROPLASTY Left 02/22/2020   Procedure: TOTAL KNEE ARTHROPLASTY;  Surgeon: Ollen Gross, MD;  Location: [facility] ORS;  Service: Orthopedics;  Laterality: Left;     Family History  Problem Relation Age of Onset   Heart disease Mother    Diabetes Mother    Cancer Maternal Grandfather    Breast cancer Neg Hx    Social History:  reports that she quit smoking about 34 years ago. Her smoking use included cigarettes. She started smoking about 44 years ago. She has a 2.5 pack-year smoking history. She has never used smokeless tobacco. She reports current alcohol use. She reports that she does not use drugs.  Allergies: No Known Allergies  Medications Prior to Admission  Medication Sig Dispense Refill   acetaminophen (TYLENOL) 500 MG tablet Take 1,000 mg by mouth every 4 (four) hours as needed (back pain.).     amLODipine (NORVASC) 5 MG tablet Take 5 mg by mouth daily.  aspirin EC 81 MG tablet Take 81 mg by mouth daily. Swallow whole.     Calcium-Magnesium (CAL-MAG PO) Take 1 tablet by mouth daily.     colesevelam (WELCHOL) 625 MG tablet Take 625 mg by mouth 2 (two) times daily with a meal.     cyclobenzaprine (FLEXERIL) 10 MG tablet Take 1 tablet (10 mg total) by mouth 3 (three) times daily as needed for muscle spasms. 40 tablet 0   docusate sodium (COLACE) 100 MG capsule Take 100 mg by mouth every other day.     DULoxetine (CYMBALTA) 60 MG capsule Take 60 mg by mouth at bedtime.      famotidine (PEPCID) 40 MG tablet Take 40 mg by mouth daily as needed for heartburn or indigestion.     furosemide (LASIX) 20 MG tablet Take 20 mg by mouth daily as needed for edema.     gemfibrozil (LOPID) 600 MG tablet Take 600 mg by mouth 2 (two) times  daily.      iron polysaccharides (NIFEREX) 150 MG capsule Take 150 mg by mouth daily.     levothyroxine (SYNTHROID, LEVOTHROID) 100 MCG tablet Take 100 mcg by mouth daily before breakfast.     losartan (COZAAR) 50 MG tablet Take 50 mg by mouth daily.     metoprolol succinate (TOPROL-XL) 100 MG 24 hr tablet Take 100 mg by mouth daily.   0   Multiple Vitamin (MULTIVITAMIN WITH MINERALS) TABS tablet Take 1 tablet by mouth daily. One a day     pregabalin (LYRICA) 100 MG capsule Take 100 mg by mouth 2 (two) times daily.     triamcinolone cream (KENALOG) 0.1 % Apply 1 application topically daily as needed (Eczema).      Results for orders placed or performed during the hospital encounter of 08/17/22 (from the past 48 hour(s))  Type and screen [facility]     Status: None (Preliminary result)   Collection Time: 08/17/22  9:20 AM  Result Value Ref Range   ABO/RH(D) PENDING    Antibody Screen PENDING    Sample Expiration      08/20/2022,2359 Performed at [facility], [address], [place], [place] [address]    No results found.  Review of Systems  Constitutional: Negative.   HENT: Negative.    Eyes: Negative.   Respiratory: Negative.    Cardiovascular: Negative.   Gastrointestinal: Negative.   Endocrine: Negative.   Genitourinary: Negative.   Musculoskeletal:  Positive for back pain.  Allergic/Immunologic: Negative.   Neurological: Negative.   Hematological: Negative.   Psychiatric/Behavioral: Negative.      Blood pressure (!) 159/63, pulse 63, temperature 98.3 F (36.8 C), temperature source Oral, resp. rate 17, height 4\' 11"  (1.499 m), weight 89.8 kg, SpO2 94%. Physical Exam Constitutional:      Appearance: Normal appearance. She is obese.  HENT:     Head: Normocephalic and atraumatic.     Right Ear: External ear normal.     Left Ear: External ear normal.     Nose: Nose normal.     Mouth/Throat:     Mouth: Mucous membranes are moist.     Pharynx:  Oropharynx is clear.  Eyes:     Extraocular Movements: Extraocular movements intact.     Pupils: Pupils are equal, round, and reactive to light.  Cardiovascular:     Rate and Rhythm: Normal rate and regular rhythm.  Pulmonary:     Effort: Pulmonary effort is normal.  Abdominal:  General: Abdomen is flat.     Palpations: Abdomen is soft.  Musculoskeletal:        General: Normal range of motion.     Cervical back: Normal range of motion.  Skin:    General: Skin is warm and dry.  Neurological:     General: No focal deficit present.     Mental Status: She is alert and oriented to person, place, and time.  Psychiatric:        Mood and Affect: Mood normal.        Behavior: Behavior normal.        Thought Content: Thought content normal.        Judgment: Judgment normal.      Assessment/Plan Admit for painful hardware removal.   Coletta Memos, MD 08/17/2022, 10:36 AM

## 2022-08-17 NOTE — Anesthesia Procedure Notes (Signed)
Procedure Name: Intubation Date/Time: 08/17/2022 11:50 AM  Performed by: Orlin Hilding, CRNAPre-anesthesia Checklist: Patient identified, Emergency Drugs available, Suction available, Patient being monitored and Timeout performed Patient Re-evaluated:Patient Re-evaluated prior to induction Oxygen Delivery Method: Circle system utilized Preoxygenation: Pre-oxygenation with 100% oxygen Induction Type: IV induction Ventilation: Mask ventilation without difficulty and Oral airway inserted - appropriate to patient size Laryngoscope Size: Mac and 3 Grade View: Grade I Tube type: Oral Tube size: 7.0 mm Number of attempts: 1 Placement Confirmation: ETT inserted through vocal cords under direct vision, positive ETCO2 and breath sounds checked- equal and bilateral Secured at: 22 cm Tube secured with: Tape Dental Injury: Teeth and Oropharynx as per pre-operative assessment

## 2022-08-17 NOTE — Anesthesia Postprocedure Evaluation (Signed)
Anesthesia Post Note  Patient: Ashley Mercer  Procedure(s) Performed: Posterolateral Arthrodesis Lumbar Five-Sacral One with Allograft Sacral-One Pedicle Screw and Connector Removal (Spine Lumbar)     Patient location during evaluation: PACU Anesthesia Type: General Level of consciousness: awake and alert Pain management: pain level controlled Vital Signs Assessment: post-procedure vital signs reviewed and stable Respiratory status: spontaneous breathing, nonlabored ventilation, respiratory function stable and patient connected to nasal cannula oxygen Cardiovascular status: blood pressure returned to baseline and stable Postop Assessment: no apparent nausea or vomiting Anesthetic complications: no  No notable events documented.  Last Vitals:  Vitals:   08/17/22 1446 08/17/22 1447  BP: 129/66   Pulse: (!) 57   Resp: 19   Temp:  (!) 36.4 C  SpO2: 95%     Last Pain:  Vitals:   08/17/22 1430  TempSrc:   PainSc: 10-Worst pain ever                 , S

## 2022-08-17 NOTE — Transfer of Care (Signed)
Immediate Anesthesia Transfer of Care Note  Patient: Ashley Mercer  Procedure(s) Performed: Posterolateral Arthrodesis Lumbar Five-Sacral One with Allograft Sacral-One Pedicle Screw and Connector Removal (Spine Lumbar)  Patient Location: PACU  Anesthesia Type:General  Level of Consciousness: drowsy  Airway & Oxygen Therapy: Patient Spontanous Breathing and Patient connected to face mask oxygen  Post-op Assessment: Report given to RN and Post -op Vital signs reviewed and stable  Post vital signs: Reviewed and stable  Last Vitals:  Vitals Value Taken Time  BP 130/84 08/17/22 1401  Temp    Pulse 61 08/17/22 1403  Resp 22 08/17/22 1403  SpO2 91 % 08/17/22 1403  Vitals shown include unfiled device data.  Last Pain:  Vitals:   08/17/22 0916  TempSrc:   PainSc: 0-No pain         Complications: Patient with sats in the 88-90% range  Dr Okey Dupre notified and ordered a duo nebulizer in PACU

## 2022-08-17 NOTE — Anesthesia Preprocedure Evaluation (Addendum)
Anesthesia Evaluation  Patient identified by MRN, date of birth, ID band Patient awake    Reviewed: Allergy & Precautions, H&P , NPO status , Patient's Chart, lab work & pertinent test results  Airway Mallampati: II  TM Distance: >3 FB Neck ROM: Full    Dental no notable dental hx.    Pulmonary former smoker Room air saturation 88%   Pulmonary exam normal breath sounds clear to auscultation       Cardiovascular hypertension, Normal cardiovascular exam(-) dysrhythmias  Rhythm:Regular Rate:Normal     Neuro/Psych negative neurological ROS  negative psych ROS   GI/Hepatic Neg liver ROS,GERD  ,,  Endo/Other  Hypothyroidism  Morbid obesity  Renal/GU negative Renal ROS  negative genitourinary   Musculoskeletal negative musculoskeletal ROS (+)    Abdominal   Peds negative pediatric ROS (+)  Hematology negative hematology ROS (+)   Anesthesia Other Findings   Reproductive/Obstetrics negative OB ROS                             Anesthesia Physical Anesthesia Plan  ASA: 3  Anesthesia Plan: General   Post-op Pain Management: Minimal or no pain anticipated   Induction: Intravenous  PONV Risk Score and Plan: 3 and Ondansetron, Dexamethasone and Treatment may vary due to age or medical condition  Airway Management Planned: Oral ETT  Additional Equipment:   Intra-op Plan:   Post-operative Plan: Extubation in OR  Informed Consent: I have reviewed the patients History and Physical, chart, labs and discussed the procedure including the risks, benefits and alternatives for the proposed anesthesia with the patient or authorized representative who has indicated his/her understanding and acceptance.     Dental advisory given  Plan Discussed with: CRNA and Surgeon  Anesthesia Plan Comments:        Anesthesia Quick Evaluation

## 2022-08-17 NOTE — Discharge Instructions (Signed)
Lumbar Laminectomy Care After A laminectomy is an operation performed on the spine. The purpose is to decompress the spinal cord and/or the nerve roots.  The time in surgery depends on the findings in surgery and what is necessary to correct the problems. HOME CARE INSTRUCTIONS   Check the cut (incision) made by the surgeon twice a day for signs of infection. Some signs of infection may include:   A foul smelling, greenish or yellowish discharge from the wound.   Increased pain.   Increased redness over the incision (operative) site.   The skin edges may separate.   Flu-like symptoms (problems).   A temperature above 101.5 F (38.6 C).   Change your bandages in about 24 to 36 hours following surgery or as directed.   You may shower tomorrow. Avoid bathtubs, swimming pools and hot tubs for three weeks or until your incision has healed completely. If you have stitches or staples, they may be removed 2 to 3 weeks after surgery, or as directed by your doctor. This may be done by your doctor or caregiver.   You may walk as much as you like. No need to exercise at this time. Limit lifting to ~10lbs.  Weight reduction may be beneficial if you are overweight.   Daily exercise is helpful to prevent the return of problems. Walking is permitted. You may use a treadmill without an incline. Cut down on activities and exercise if you have discomfort. You may also go up and down stairs as much as you can tolerate.   DO NOT lift anything heavier than 10 . Avoid bending or twisting at the waist. Always bend your knees when lifting.   Maintain strength and range of motion as instructed.   Do not drive for 2 to 3 weeks, or as directed by your doctors. You may be a passenger for 20 to 30 minute trips. Lying back in the passenger seat may be more comfortable for you. Always wear a seatbelt.   Limit your sitting in a regular chair to 20 to 30 minutes at a time. There are no limitations for sitting in a  recliner. You should lie down or walk in between sitting periods.   Only take over-the-counter or prescription medicines for pain, discomfort, or fever as directed by your caregiver.  SEEK MEDICAL CARE IF:   There is increased bleeding (more than a small spot) from the wound.   You notice redness, swelling, or increasing pain in the wound.   Pus is coming from wound.   You develop an unexplained oral temperature above 102 F (38.9 C) develops.   You notice a foul smell coming from the wound or dressing.   You have increasing pain in your wound.  SEEK IMMEDIATE MEDICAL CARE IF:   You develop a rash.   You have difficulty breathing.   You develop any allergic problems to medicines given.   

## 2022-08-17 NOTE — Discharge Summary (Signed)
Physician Discharge Summary  Patient ID: Ashley Mercer MRN: [id number] DOB/AGE: 1940-09-25 82 y.o.  Admit date: 08/17/2022 Discharge date: 08/17/2022  Admission Diagnoses:psuedoarthrosis L5/S1 Painful hardware  Discharge Diagnoses: same Principal Problem:   Closed pseudoarthrosis of lumbar spine   Discharged Condition: good  Hospital Course: Ashley Mercer was taken to the operating room where I removed the S1 screw and connector. The screws were simply pulled out and had no purchase. I left the cages in place. I placed allograft morsels and proteos in the lateral gutters for a posterolateral arthrodesis She is ambulating, voiding, and tolerating a regular diet at discharge. The wound is clean, dry, and without signs of infection.  Treatments: surgery: as above  Discharge Exam: Blood pressure (!) 150/60, pulse (!) 58, temperature 98.9 F (37.2 C), resp. rate 20, height 4\' 11"  (1.499 m), weight 89.8 kg, SpO2 94%. General appearance: alert, cooperative, appears stated age, and mild distress  Disposition: Discharge disposition: 01-Home or Self Care      Pseudoarthrosis Lumbar Five-Sacral One  Allergies as of 08/17/2022   No Known Allergies      Medication List     TAKE these medications    acetaminophen 500 MG tablet Commonly known as: TYLENOL Take 1,000 mg by mouth every 4 (four) hours as needed (back pain.).   amLODipine 5 MG tablet Commonly known as: NORVASC Take 5 mg by mouth daily.   aspirin EC 81 MG tablet Take 81 mg by mouth daily. Swallow whole.   CAL-MAG PO Take 1 tablet by mouth daily.   colesevelam 625 MG tablet Commonly known as: WELCHOL Take 625 mg by mouth 2 (two) times daily with a meal.   cyclobenzaprine 10 MG tablet Commonly known as: FLEXERIL Take 1 tablet (10 mg total) by mouth 3 (three) times daily as needed for muscle spasms.   diazepam 5 MG tablet Commonly known as: Valium Take 1 tablet (5 mg total) by mouth every 6 (six) hours as needed for  anxiety.   docusate sodium 100 MG capsule Commonly known as: COLACE Take 100 mg by mouth every other day.   DULoxetine 60 MG capsule Commonly known as: CYMBALTA Take 60 mg by mouth at bedtime.   famotidine 40 MG tablet Commonly known as: PEPCID Take 40 mg by mouth daily as needed for heartburn or indigestion.   furosemide 20 MG tablet Commonly known as: LASIX Take 20 mg by mouth daily as needed for edema.   gemfibrozil 600 MG tablet Commonly known as: LOPID Take 600 mg by mouth 2 (two) times daily.   iron polysaccharides 150 MG capsule Commonly known as: NIFEREX Take 150 mg by mouth daily.   levothyroxine 100 MCG tablet Commonly known as: SYNTHROID Take 100 mcg by mouth daily before breakfast.   losartan 50 MG tablet Commonly known as: COZAAR Take 50 mg by mouth daily.   metoprolol succinate 100 MG 24 hr tablet Commonly known as: TOPROL-XL Take 100 mg by mouth daily.   multivitamin with minerals Tabs tablet Take 1 tablet by mouth daily. One a day   oxyCODONE 5 MG immediate release tablet Commonly known as: Oxy IR/ROXICODONE Take 1 tablet (5 mg total) by mouth every 6 (six) hours as needed for up to 8 days for moderate pain ((score 4 to 6)).   pregabalin 100 MG capsule Commonly known as: LYRICA Take 100 mg by mouth 2 (two) times daily.   triamcinolone cream 0.1 % Commonly known as: KENALOG Apply 1 application topically daily as needed (Eczema).  Follow-up Information     Coletta Memos, MD Follow up.   Specialty: Neurosurgery Why: keep your scheduled Appointment Contact information: [address] [place] [address] [phone]                 Signed: Coletta Memos 08/17/2022, 4:44 PM

## 2022-08-17 NOTE — Progress Notes (Signed)
Discharge instructions/education/AVS/Rx given to patient with daughter at bedside and reiterated the follow up check up in week with surgeon and they both verbalized understanding. Pain is mild and tolerable per patient. No drainage, no swelling, no redness on incision site, dressing is clean, dry and intact. Patient ambulating well with walker, Patient voided and emptied bladder well. Patient tolerated dinner with no complaints of nausea. Patient discharged via wheelchair.

## 2022-08-17 NOTE — Evaluation (Signed)
Physical Therapy Evaluation Patient Details Name: Ashley Mercer MRN: [id number] DOB: November 13, 1940 Today's Date: 08/17/2022  History of Present Illness  Pt is an 82 y/o female admitted for elective lumbar surgery due to ongoing back pain.  S/P PLA L5 S1 with pedical screw and connector removal.  PMH  multiple lumbar/cervical surgeries, L and R TKA's, heart block, HTN, PVD  Clinical Impression  Pt is at or close to baseline functioning and should be safe at home . There are no further acute PT needs.  Will sign off at this time.         If plan is discharge home, recommend the following: A little help with bathing/dressing/bathroom;Help with stairs or ramp for entrance   Can travel by private vehicle        Equipment Recommendations None recommended by PT  Recommendations for Other Services       Functional Status Assessment Patient has had a recent decline in their functional status and demonstrates the ability to make significant improvements in function in a reasonable and predictable amount of time.     Precautions / Restrictions Precautions Precautions: Back Required Braces or Orthoses:  (no brace needed) Restrictions Weight Bearing Restrictions: No      Mobility  Bed Mobility               General bed mobility comments: discussed safe technique with pt and dtr's, but pt already EOB and deferred further BM    Transfers Overall transfer level: Needs assistance   Transfers: Sit to/from Stand Sit to Stand: Supervision           General transfer comment: discussed safe technique with/without the RW.  Fielded questions with pt and dtr's    Ambulation/Gait               General Gait Details: pt just returned from >100 foot walk with RW.  Pt and family were happy with pt's performance.  Discussed progression of gait/mobility after d/c.  Stairs            Wheelchair Mobility     Tilt Bed    Modified Rankin (Stroke Patients Only)        Balance Overall balance assessment: No apparent balance deficits (not formally assessed)                                           Pertinent Vitals/Pain Pain Assessment Pain Assessment: Faces Faces Pain Scale: Hurts little more Pain Location: back Pain Descriptors / Indicators: Aching Pain Intervention(s): Monitored during session    Home Living Family/patient expects to be discharged to:: Private residence Living Arrangements: Children Available Help at Discharge: Family;Available 24 hours/day;Available PRN/intermittently Type of Home: House Home Access: Stairs to enter Entrance Stairs-Rails: Doctor, general practice of Steps: 2   Home Layout: One level Home Equipment: Agricultural consultant (2 wheels);BSC/3in1 Lexicographer)      Prior Function Prior Level of Function : Independent/Modified Independent             Mobility Comments: generally independent, drives, travels ADLs Comments: generally independent,     Hand Dominance        Extremity/Trunk Assessment   Upper Extremity Assessment Upper Extremity Assessment: Overall WFL for tasks assessed    Lower Extremity Assessment Lower Extremity Assessment: Overall WFL for tasks assessed;Generalized weakness    Cervical / Trunk Assessment Cervical / Trunk Assessment:  Back Surgery  Communication   Communication: No difficulties  Cognition Arousal/Alertness: Awake/alert Behavior During Therapy: WFL for tasks assessed/performed Overall Cognitive Status: Within Functional Limits for tasks assessed                                          General Comments General comments (skin integrity, edema, etc.): Completed pt/family education.    Exercises     Assessment/Plan    PT Assessment Patient needs continued PT services  PT Problem List Decreased mobility;Decreased activity tolerance       PT Treatment Interventions      PT Goals (Current goals can be found in the  Care Plan section)  Acute Rehab PT Goals Patient Stated Goal: home soon PT Goal Formulation: All assessment and education complete, DC therapy    Frequency       Co-evaluation               AM-PAC PT "6 Clicks" Mobility  Outcome Measure Help needed turning from your back to your side while in a flat bed without using bedrails?: A Little Help needed moving from lying on your back to sitting on the side of a flat bed without using bedrails?: A Little Help needed moving to and from a bed to a chair (including a wheelchair)?: A Little Help needed standing up from a chair using your arms (e.g., wheelchair or bedside chair)?: A Little Help needed to walk in hospital room?: A Little Help needed climbing 3-5 steps with a railing? : A Little 6 Click Score: 18    End of Session   Activity Tolerance: Patient tolerated treatment well Patient left: in bed;with call bell/phone within reach;with family/visitor present (EOB) Nurse Communication: Mobility status;Precautions PT Visit Diagnosis: Other abnormalities of gait and mobility (R26.89);Pain    Time: 1610-9604 PT Time Calculation (min) (ACUTE ONLY): 21 min   Charges:   PT Evaluation $PT Eval Low Complexity: 1 Low   PT General Charges $$ ACUTE PT VISIT: 1 Visit         08/17/2022  Jacinto Halim., PT Acute Rehabilitation Services [phone]  (office)  Eliseo Gum  08/17/2022, 7:34 PM

## 2022-08-17 NOTE — Op Note (Signed)
08/17/2022  2:05 PM  PATIENT:  Ashley Mercer  82 y.o. female  PRE-OPERATIVE DIAGNOSIS:  Pseudoarthrosis Lumbar Five-Sacral One  POST-OPERATIVE DIAGNOSIS:  Pseudoarthrosis Lumbar Five-Sacral One  PROCEDURE:  Procedure(s): Posterolateral Arthrodesis Lumbar Five-Sacral One with Allograft Sacral-One Pedicle Screw and Connector Removal  SURGEON: Surgeon(s): Coletta Memos, MD  ASSISTANTS:none  ANESTHESIA:   general  EBL:  Total I/O In: 1250 [I.V.:1000; IV Piggyback:250] Out: 20 [Blood:20]  BLOOD ADMINISTERED:none  CELL SAVER GIVEN:not used  COUNT:per nursing  DRAINS: none   SPECIMEN:  No Specimen  DICTATION: [name] was taken to the operating room, intubated, and placed under a general anesthetic without difficulty. She was positioned prone on the [place] table with all pressure points properly padded. Her back was prepped and draped in a sterile manner.  I opened the incision with a 10 blade and dissected to the thoracolumbar fascia. I dissected initially to the left and exposed the hardware. I removed the locking caps, the rod, and the screw. I did the same on the right side.  I decorticated the L5 and Sacral bone and placed allograft morsels with Proteos in the lateral gutters bilaterally to complete the posterolateral arthrodesis. I infiltrated marcaine into the paraspinous tissue then closed. I approximated the thoracolumbar fascia, the subcutaneous and subcuticular planes with vicryl sutures.  I applied a sterile dressing. She was rolled supine on the OR stretcher, extubated and moving all extremities.  PLAN OF CARE: Admit for overnight observation  PATIENT DISPOSITION:  PACU - hemodynamically stable.   Delay start of Pharmacological VTE agent (>24hrs) due to surgical blood loss or risk of bleeding:  no

## 2022-08-19 ENCOUNTER — Encounter (HOSPITAL_COMMUNITY): Payer: Self-pay | Admitting: Neurosurgery

## 2022-08-20 MED FILL — Thrombin For Soln 5000 Unit: CUTANEOUS | Qty: 2 | Status: AC

## 2022-08-30 DIAGNOSIS — I10 Essential (primary) hypertension: Secondary | ICD-10-CM | POA: Diagnosis not present

## 2022-08-30 DIAGNOSIS — M8588 Other specified disorders of bone density and structure, other site: Secondary | ICD-10-CM | POA: Diagnosis not present

## 2022-08-30 DIAGNOSIS — G8929 Other chronic pain: Secondary | ICD-10-CM | POA: Diagnosis not present

## 2022-08-30 DIAGNOSIS — K219 Gastro-esophageal reflux disease without esophagitis: Secondary | ICD-10-CM | POA: Diagnosis not present

## 2022-08-30 DIAGNOSIS — M545 Low back pain, unspecified: Secondary | ICD-10-CM | POA: Diagnosis not present

## 2022-08-30 DIAGNOSIS — R7303 Prediabetes: Secondary | ICD-10-CM | POA: Diagnosis not present

## 2022-08-30 DIAGNOSIS — M5432 Sciatica, left side: Secondary | ICD-10-CM | POA: Diagnosis not present

## 2022-08-30 DIAGNOSIS — E039 Hypothyroidism, unspecified: Secondary | ICD-10-CM | POA: Diagnosis not present

## 2022-08-30 DIAGNOSIS — Z0000 Encounter for general adult medical examination without abnormal findings: Secondary | ICD-10-CM | POA: Diagnosis not present

## 2022-08-30 DIAGNOSIS — E782 Mixed hyperlipidemia: Secondary | ICD-10-CM | POA: Diagnosis not present

## 2022-08-30 DIAGNOSIS — Z23 Encounter for immunization: Secondary | ICD-10-CM | POA: Diagnosis not present

## 2022-08-30 DIAGNOSIS — D649 Anemia, unspecified: Secondary | ICD-10-CM | POA: Diagnosis not present

## 2022-08-30 DIAGNOSIS — M8589 Other specified disorders of bone density and structure, multiple sites: Secondary | ICD-10-CM | POA: Diagnosis not present

## 2023-03-13 DIAGNOSIS — I10 Essential (primary) hypertension: Secondary | ICD-10-CM | POA: Diagnosis not present

## 2023-03-13 DIAGNOSIS — E782 Mixed hyperlipidemia: Secondary | ICD-10-CM | POA: Diagnosis not present

## 2023-03-13 DIAGNOSIS — E039 Hypothyroidism, unspecified: Secondary | ICD-10-CM | POA: Diagnosis not present

## 2023-03-13 DIAGNOSIS — E559 Vitamin D deficiency, unspecified: Secondary | ICD-10-CM | POA: Diagnosis not present

## 2023-04-25 DIAGNOSIS — Z961 Presence of intraocular lens: Secondary | ICD-10-CM | POA: Diagnosis not present

## 2023-04-25 DIAGNOSIS — H02831 Dermatochalasis of right upper eyelid: Secondary | ICD-10-CM | POA: Diagnosis not present

## 2023-04-25 DIAGNOSIS — H18413 Arcus senilis, bilateral: Secondary | ICD-10-CM | POA: Diagnosis not present

## 2023-04-25 DIAGNOSIS — H26492 Other secondary cataract, left eye: Secondary | ICD-10-CM | POA: Diagnosis not present

## 2023-05-10 DIAGNOSIS — Z1231 Encounter for screening mammogram for malignant neoplasm of breast: Secondary | ICD-10-CM | POA: Diagnosis not present

## 2023-05-10 DIAGNOSIS — N958 Other specified menopausal and perimenopausal disorders: Secondary | ICD-10-CM | POA: Diagnosis not present

## 2023-05-10 DIAGNOSIS — M8588 Other specified disorders of bone density and structure, other site: Secondary | ICD-10-CM | POA: Diagnosis not present

## 2023-07-01 DIAGNOSIS — I129 Hypertensive chronic kidney disease with stage 1 through stage 4 chronic kidney disease, or unspecified chronic kidney disease: Secondary | ICD-10-CM | POA: Diagnosis not present

## 2023-07-01 DIAGNOSIS — E669 Obesity, unspecified: Secondary | ICD-10-CM | POA: Diagnosis not present

## 2023-07-01 DIAGNOSIS — D649 Anemia, unspecified: Secondary | ICD-10-CM | POA: Diagnosis not present

## 2023-07-01 DIAGNOSIS — R809 Proteinuria, unspecified: Secondary | ICD-10-CM | POA: Diagnosis not present

## 2023-10-24 DIAGNOSIS — Z1331 Encounter for screening for depression: Secondary | ICD-10-CM | POA: Diagnosis not present

## 2023-10-24 DIAGNOSIS — E559 Vitamin D deficiency, unspecified: Secondary | ICD-10-CM | POA: Diagnosis not present

## 2023-10-24 DIAGNOSIS — Z0000 Encounter for general adult medical examination without abnormal findings: Secondary | ICD-10-CM | POA: Diagnosis not present

## 2023-10-24 DIAGNOSIS — R2689 Other abnormalities of gait and mobility: Secondary | ICD-10-CM | POA: Diagnosis not present

## 2023-10-24 DIAGNOSIS — M48061 Spinal stenosis, lumbar region without neurogenic claudication: Secondary | ICD-10-CM | POA: Diagnosis not present

## 2023-10-24 DIAGNOSIS — R7303 Prediabetes: Secondary | ICD-10-CM | POA: Diagnosis not present

## 2023-10-24 DIAGNOSIS — E782 Mixed hyperlipidemia: Secondary | ICD-10-CM | POA: Diagnosis not present

## 2023-10-24 DIAGNOSIS — R2681 Unsteadiness on feet: Secondary | ICD-10-CM | POA: Diagnosis not present

## 2023-10-24 DIAGNOSIS — Z79899 Other long term (current) drug therapy: Secondary | ICD-10-CM | POA: Diagnosis not present

## 2023-10-24 DIAGNOSIS — D649 Anemia, unspecified: Secondary | ICD-10-CM | POA: Diagnosis not present

## 2023-10-24 DIAGNOSIS — E039 Hypothyroidism, unspecified: Secondary | ICD-10-CM | POA: Diagnosis not present

## 2023-10-24 DIAGNOSIS — Z23 Encounter for immunization: Secondary | ICD-10-CM | POA: Diagnosis not present

## 2023-10-24 DIAGNOSIS — I10 Essential (primary) hypertension: Secondary | ICD-10-CM | POA: Diagnosis not present
